# Patient Record
Sex: Male | Born: 1935 | Race: Black or African American | Hispanic: No | Marital: Married | Smoking: Never smoker
Health system: Southern US, Community
[De-identification: ages and names within clinical notes are randomized; demographics above are authoritative.]

## PROBLEM LIST (undated history)

## (undated) DIAGNOSIS — E785 Hyperlipidemia, unspecified: Secondary | ICD-10-CM

## (undated) DIAGNOSIS — I10 Essential (primary) hypertension: Secondary | ICD-10-CM

## (undated) DIAGNOSIS — I509 Heart failure, unspecified: Secondary | ICD-10-CM

## (undated) DIAGNOSIS — F0390 Unspecified dementia without behavioral disturbance: Secondary | ICD-10-CM

## (undated) HISTORY — PX: HERNIA REPAIR: SHX51

---

## 2018-01-30 ENCOUNTER — Encounter (HOSPITAL_COMMUNITY): Payer: Self-pay | Admitting: Emergency Medicine

## 2018-01-30 ENCOUNTER — Inpatient Hospital Stay (HOSPITAL_COMMUNITY)
Admission: EM | Admit: 2018-01-30 | Discharge: 2018-02-05 | DRG: 674 | Disposition: A | Discharge status: Home Health | Payer: Medicare Other | Attending: Internal Medicine | Admitting: Internal Medicine

## 2018-01-30 ENCOUNTER — Other Ambulatory Visit: Payer: Self-pay

## 2018-01-30 ENCOUNTER — Emergency Department (HOSPITAL_COMMUNITY): Payer: Medicare Other

## 2018-01-30 DIAGNOSIS — F0390 Unspecified dementia without behavioral disturbance: Secondary | ICD-10-CM | POA: Diagnosis present

## 2018-01-30 DIAGNOSIS — I509 Heart failure, unspecified: Secondary | ICD-10-CM

## 2018-01-30 DIAGNOSIS — N183 Chronic kidney disease, stage 3 (moderate): Secondary | ICD-10-CM | POA: Diagnosis present

## 2018-01-30 DIAGNOSIS — Z8673 Personal history of transient ischemic attack (TIA), and cerebral infarction without residual deficits: Secondary | ICD-10-CM

## 2018-01-30 DIAGNOSIS — I70203 Unspecified atherosclerosis of native arteries of extremities, bilateral legs: Secondary | ICD-10-CM | POA: Diagnosis present

## 2018-01-30 DIAGNOSIS — I5022 Chronic systolic (congestive) heart failure: Secondary | ICD-10-CM | POA: Diagnosis present

## 2018-01-30 DIAGNOSIS — I701 Atherosclerosis of renal artery: Principal | ICD-10-CM | POA: Diagnosis present

## 2018-01-30 DIAGNOSIS — R471 Dysarthria and anarthria: Secondary | ICD-10-CM

## 2018-01-30 DIAGNOSIS — I272 Pulmonary hypertension, unspecified: Secondary | ICD-10-CM | POA: Diagnosis present

## 2018-01-30 DIAGNOSIS — I10 Essential (primary) hypertension: Secondary | ICD-10-CM | POA: Diagnosis not present

## 2018-01-30 DIAGNOSIS — Z7901 Long term (current) use of anticoagulants: Secondary | ICD-10-CM | POA: Diagnosis not present

## 2018-01-30 DIAGNOSIS — Z23 Encounter for immunization: Secondary | ICD-10-CM

## 2018-01-30 DIAGNOSIS — H11002 Unspecified pterygium of left eye: Secondary | ICD-10-CM | POA: Diagnosis present

## 2018-01-30 DIAGNOSIS — R4781 Slurred speech: Secondary | ICD-10-CM | POA: Diagnosis present

## 2018-01-30 DIAGNOSIS — Z823 Family history of stroke: Secondary | ICD-10-CM | POA: Diagnosis not present

## 2018-01-30 DIAGNOSIS — R21 Rash and other nonspecific skin eruption: Secondary | ICD-10-CM | POA: Diagnosis present

## 2018-01-30 DIAGNOSIS — Z79890 Hormone replacement therapy (postmenopausal): Secondary | ICD-10-CM | POA: Diagnosis not present

## 2018-01-30 DIAGNOSIS — N289 Disorder of kidney and ureter, unspecified: Secondary | ICD-10-CM | POA: Diagnosis present

## 2018-01-30 DIAGNOSIS — I495 Sick sinus syndrome: Secondary | ICD-10-CM | POA: Diagnosis present

## 2018-01-30 DIAGNOSIS — I2510 Atherosclerotic heart disease of native coronary artery without angina pectoris: Secondary | ICD-10-CM | POA: Diagnosis present

## 2018-01-30 DIAGNOSIS — R001 Bradycardia, unspecified: Secondary | ICD-10-CM | POA: Diagnosis not present

## 2018-01-30 DIAGNOSIS — R29703 NIHSS score 3: Secondary | ICD-10-CM | POA: Diagnosis present

## 2018-01-30 DIAGNOSIS — I700 Atherosclerosis of aorta: Secondary | ICD-10-CM | POA: Diagnosis present

## 2018-01-30 DIAGNOSIS — I4891 Unspecified atrial fibrillation: Secondary | ICD-10-CM

## 2018-01-30 DIAGNOSIS — E785 Hyperlipidemia, unspecified: Secondary | ICD-10-CM | POA: Diagnosis present

## 2018-01-30 DIAGNOSIS — I160 Hypertensive urgency: Secondary | ICD-10-CM | POA: Diagnosis present

## 2018-01-30 DIAGNOSIS — I5020 Unspecified systolic (congestive) heart failure: Secondary | ICD-10-CM | POA: Diagnosis not present

## 2018-01-30 DIAGNOSIS — Z79899 Other long term (current) drug therapy: Secondary | ICD-10-CM

## 2018-01-30 DIAGNOSIS — E039 Hypothyroidism, unspecified: Secondary | ICD-10-CM | POA: Diagnosis present

## 2018-01-30 DIAGNOSIS — E875 Hyperkalemia: Secondary | ICD-10-CM | POA: Diagnosis not present

## 2018-01-30 DIAGNOSIS — T500X5A Adverse effect of mineralocorticoids and their antagonists, initial encounter: Secondary | ICD-10-CM | POA: Diagnosis present

## 2018-01-30 DIAGNOSIS — I4821 Permanent atrial fibrillation: Secondary | ICD-10-CM | POA: Diagnosis not present

## 2018-01-30 DIAGNOSIS — I5021 Acute systolic (congestive) heart failure: Secondary | ICD-10-CM | POA: Diagnosis not present

## 2018-01-30 DIAGNOSIS — X58XXXA Exposure to other specified factors, initial encounter: Secondary | ICD-10-CM | POA: Diagnosis not present

## 2018-01-30 HISTORY — DX: Heart failure, unspecified: I50.9

## 2018-01-30 HISTORY — DX: Hyperlipidemia, unspecified: E78.5

## 2018-01-30 HISTORY — DX: Essential (primary) hypertension: I10

## 2018-01-30 LAB — DIFFERENTIAL
Abs Immature Granulocytes: 0.01 10*3/uL (ref 0.00–0.07)
Basophils Absolute: 0 10*3/uL (ref 0.0–0.1)
Basophils Relative: 1 %
EOS PCT: 2 %
Eosinophils Absolute: 0.1 10*3/uL (ref 0.0–0.5)
Immature Granulocytes: 0 %
Lymphocytes Relative: 26 %
Lymphs Abs: 1.3 10*3/uL (ref 0.7–4.0)
Monocytes Absolute: 0.5 10*3/uL (ref 0.1–1.0)
Monocytes Relative: 11 %
Neutro Abs: 3 10*3/uL (ref 1.7–7.7)
Neutrophils Relative %: 60 %

## 2018-01-30 LAB — URINALYSIS, ROUTINE W REFLEX MICROSCOPIC
Bilirubin Urine: NEGATIVE
Glucose, UA: NEGATIVE mg/dL
Hgb urine dipstick: NEGATIVE
Ketones, ur: NEGATIVE mg/dL
Leukocytes, UA: NEGATIVE
Nitrite: NEGATIVE
PROTEIN: NEGATIVE mg/dL
Specific Gravity, Urine: 1.016 (ref 1.005–1.030)
pH: 6 (ref 5.0–8.0)

## 2018-01-30 LAB — I-STAT CHEM 8, ED
BUN: 25 mg/dL — ABNORMAL HIGH (ref 8–23)
Calcium, Ion: 1.11 mmol/L — ABNORMAL LOW (ref 1.15–1.40)
Chloride: 108 mmol/L (ref 98–111)
Creatinine, Ser: 1.2 mg/dL (ref 0.61–1.24)
Glucose, Bld: 84 mg/dL (ref 70–99)
HCT: 39 % (ref 39.0–52.0)
HEMOGLOBIN: 13.3 g/dL (ref 13.0–17.0)
Potassium: 4 mmol/L (ref 3.5–5.1)
Sodium: 140 mmol/L (ref 135–145)
TCO2: 23 mmol/L (ref 22–32)

## 2018-01-30 LAB — PROTIME-INR
INR: 1.34
Prothrombin Time: 16.5 seconds — ABNORMAL HIGH (ref 11.4–15.2)

## 2018-01-30 LAB — CBC
HCT: 40.4 % (ref 39.0–52.0)
Hemoglobin: 12.3 g/dL — ABNORMAL LOW (ref 13.0–17.0)
MCH: 29.6 pg (ref 26.0–34.0)
MCHC: 30.4 g/dL (ref 30.0–36.0)
MCV: 97.3 fL (ref 80.0–100.0)
Platelets: 121 10*3/uL — ABNORMAL LOW (ref 150–400)
RBC: 4.15 MIL/uL — ABNORMAL LOW (ref 4.22–5.81)
RDW: 13.9 % (ref 11.5–15.5)
WBC: 4.9 10*3/uL (ref 4.0–10.5)
nRBC: 0 % (ref 0.0–0.2)

## 2018-01-30 LAB — CBG MONITORING, ED
GLUCOSE-CAPILLARY: 77 mg/dL (ref 70–99)
Glucose-Capillary: 81 mg/dL (ref 70–99)

## 2018-01-30 LAB — COMPREHENSIVE METABOLIC PANEL
ALK PHOS: 46 U/L (ref 38–126)
ALT: 16 U/L (ref 0–44)
ANION GAP: 10 (ref 5–15)
AST: 23 U/L (ref 15–41)
Albumin: 3.7 g/dL (ref 3.5–5.0)
BUN: 24 mg/dL — ABNORMAL HIGH (ref 8–23)
CO2: 22 mmol/L (ref 22–32)
Calcium: 9.1 mg/dL (ref 8.9–10.3)
Chloride: 110 mmol/L (ref 98–111)
Creatinine, Ser: 1.29 mg/dL — ABNORMAL HIGH (ref 0.61–1.24)
GFR calc Af Amer: 58 mL/min — ABNORMAL LOW (ref >60)
GFR calc non Af Amer: 50 mL/min — ABNORMAL LOW (ref >60)
Glucose, Bld: 89 mg/dL (ref 70–99)
Potassium: 4.2 mmol/L (ref 3.5–5.1)
SODIUM: 142 mmol/L (ref 135–145)
Total Bilirubin: 0.8 mg/dL (ref 0.3–1.2)
Total Protein: 6.5 g/dL (ref 6.5–8.1)

## 2018-01-30 LAB — I-STAT TROPONIN, ED: Troponin i, poc: 0.01 ng/mL (ref 0.00–0.08)

## 2018-01-30 LAB — MRSA PCR SCREENING: MRSA by PCR: NEGATIVE

## 2018-01-30 LAB — DIGOXIN LEVEL

## 2018-01-30 LAB — APTT: aPTT: 37 seconds — ABNORMAL HIGH (ref 24–36)

## 2018-01-30 MED ORDER — APIXABAN 5 MG PO TABS
5.0000 mg | ORAL_TABLET | Freq: Two times a day (BID) | ORAL | Status: DC
  Administered 2018-01-30 – 2018-02-01 (×4): 5 mg via ORAL
  Filled 2018-01-30 (×4): qty 1

## 2018-01-30 MED ORDER — SPIRONOLACTONE 25 MG PO TABS
25.0000 mg | ORAL_TABLET | Freq: Every day | ORAL | Status: DC
  Administered 2018-01-31: 25 mg via ORAL
  Filled 2018-01-30: qty 1

## 2018-01-30 MED ORDER — INFLUENZA VAC SPLIT HIGH-DOSE 0.5 ML IM SUSY
0.5000 mL | PREFILLED_SYRINGE | INTRAMUSCULAR | Status: AC
  Administered 2018-02-01: 0.5 mL via INTRAMUSCULAR
  Filled 2018-01-30: qty 0.5

## 2018-01-30 MED ORDER — PNEUMOCOCCAL VAC POLYVALENT 25 MCG/0.5ML IJ INJ
0.5000 mL | INJECTION | INTRAMUSCULAR | Status: AC
  Administered 2018-02-01: 0.5 mL via INTRAMUSCULAR

## 2018-01-30 MED ORDER — ACETAMINOPHEN 650 MG RE SUPP: 650.0000 mg | Freq: Four times a day (QID) | RECTAL | Status: DC | PRN

## 2018-01-30 MED ORDER — FUROSEMIDE 40 MG PO TABS
40.0000 mg | ORAL_TABLET | Freq: Every day | ORAL | Status: DC
  Administered 2018-01-31 – 2018-02-05 (×6): 40 mg via ORAL
  Filled 2018-01-30 (×7): qty 1

## 2018-01-30 MED ORDER — HYDRALAZINE HCL 10 MG PO TABS
10.0000 mg | ORAL_TABLET | Freq: Four times a day (QID) | ORAL | Status: DC | PRN
  Administered 2018-01-30 – 2018-01-31 (×2): 10 mg via ORAL
  Filled 2018-01-30 (×2): qty 1

## 2018-01-30 MED ORDER — SIMVASTATIN 20 MG PO TABS
20.0000 mg | ORAL_TABLET | Freq: Every day | ORAL | Status: DC
  Administered 2018-01-30 – 2018-02-04 (×6): 20 mg via ORAL
  Filled 2018-01-30 (×6): qty 1

## 2018-01-30 MED ORDER — SODIUM CHLORIDE 0.9% FLUSH
3.0000 mL | Freq: Once | INTRAVENOUS | Status: AC
Start: 2018-01-30 — End: 2018-01-30
  Administered 2018-01-30: 3 mL via INTRAVENOUS

## 2018-01-30 MED ORDER — LISINOPRIL 20 MG PO TABS
40.0000 mg | ORAL_TABLET | Freq: Every day | ORAL | Status: DC
  Administered 2018-01-31 – 2018-02-05 (×5): 40 mg via ORAL
  Filled 2018-01-30 (×6): qty 2

## 2018-01-30 MED ORDER — SODIUM CHLORIDE 0.9% FLUSH
3.0000 mL | Freq: Two times a day (BID) | INTRAVENOUS | Status: DC
  Administered 2018-01-31 – 2018-02-05 (×8): 3 mL via INTRAVENOUS

## 2018-01-30 MED ORDER — ACETAMINOPHEN 325 MG PO TABS: 650.0000 mg | ORAL_TABLET | Freq: Four times a day (QID) | ORAL | Status: DC | PRN

## 2018-01-30 NOTE — ED Triage Notes (Signed)
Pt from home via ems; pt went down for a nap at 1230 this afternoon; woke at 1330 c/o lips feeling swollen and dry; per wife, pt has slurred speech; pt put on Clonidine yesterday (HTN management), took first does last night ; per pt's wife, pt's BP in the 200's at the [facility] yesterday; pt has no complaints; hx dementia

## 2018-01-30 NOTE — ED Notes (Signed)
Blood sent to main lab 

## 2018-01-30 NOTE — Plan of Care (Signed)
Pt has dementia but daughter is the caregiver. Daughter educated.

## 2018-01-30 NOTE — Consult Note (Signed)
Neurology Consultation  Reason for Consult: Code Stroke Referring Physician: Cathren LaineKevin Steinl, MD  CC: Slurred Speech  History is obtained from: EMS and wife via phone  HPI: Tyler GuardianMarvin Carr is a 85 y.o. male with hx CVA, Afib on eliquis, HTN, HLD, CHF bibems for slurred speech. [facility].  Clonidine was started which the pt took yesterday and this AM.  Code stroke was activated by EMS.  Pt was seen and examined. NIHSS 3.  Head Ct was negative for acute intracranial pathology.  Pt was not a candidate for IV tPA due eliquis and minor symptoms.   ED course: Pt was found to be bradycardic HR 30-40s and BP 180s systolic. Pt reported per-oral tingling but denied CP, SOB, fever, or chills.  LKW: 1230 tpa given?: no, eliquis and minor symptoms  ROS: Unable to obtain due to altered mental status.   PMH: see HPI  No family history on file.  Social History:   has no history on file for tobacco, alcohol, and drug.  Medications Eliquis Lisinopril Coreg Clonidine Lasix Spironolactone Zocor Melatonin  Exam: Current vital signs: BP (!) 149/71   Pulse (!) 41   Resp (!) 21   Ht 5\' 10"  (1.778 m)   Wt 73.9 kg   BMI 23.38 kg/m  Vital signs in last 24 hours: Pulse Rate:  [41] 41 (01/16 1600) Resp:  [21] 21 (01/16 1600) BP: (149)/(71) 149/71 (01/16 1600) Weight:  [73.9 kg] 73.9 kg (01/16 1500)  Physical Exam  Constitutional: Appears well-developed and well-nourished.  Psych: Affect appropriate to situation Eyes: No scleral injection HENT: No OP obstrucion Head: Normocephalic.  Cardiovascular: Normal rate and regular rhythm.  Respiratory: Effort normal, non-labored breathing GI: Soft.  No distension. There is no tenderness.  Skin: WDI  Neuro: Mental Status: Patient is awake, alert, oriented to person knows his age0 No signs of aphasia or neglect Cranial Nerves: II: Visual disturbances (baseline0. III,IV, VI: EOMI without ptosis or diploplia.  V:  Facial sensation is symmetric to temperature VII: Facial movement is symmetric.  VIII: hearing is intact to voice X: Uvula elevates symmetrically XI: Shoulder shrug is symmetric. XII: tongue is midline without atrophy or fasciculations.  Motor: Tone is normal. Bulk is normal. 5/5 strength was present in all four extremities. Sensory: Sensation is symmetric to light touch and temperature in the arms and legs. Deep Tendon Reflexes: 2+ and symmetric in the biceps and patellae.  Plantars: Toes are downgoing bilaterally.  Cerebellar: FNF and HKS are intact bilaterally  NIHSS 3   Labs  CBC    Component Value Date/Time   WBC 4.9 01/30/2018 1536   RBC 4.15 (L) 01/30/2018 1536   HGB 13.3 01/30/2018 1552   HCT 39.0 01/30/2018 1552   PLT 121 (L) 01/30/2018 1536   MCV 97.3 01/30/2018 1536   MCH 29.6 01/30/2018 1536   MCHC 30.4 01/30/2018 1536   RDW 13.9 01/30/2018 1536   LYMPHSABS 1.3 01/30/2018 1536   MONOABS 0.5 01/30/2018 1536   EOSABS 0.1 01/30/2018 1536   BASOSABS 0.0 01/30/2018 1536    CMP     Component Value Date/Time   NA 140 01/30/2018 1552   K 4.0 01/30/2018 1552   CL 108 01/30/2018 1552   GLUCOSE 84 01/30/2018 1552   BUN 25 (H) 01/30/2018 1552   CREATININE 1.20 01/30/2018 1552    Imaging I have reviewed the images obtained:  CT-scan of the brain: no acute intracranial pathology.   A/P: 85  yo M with hx Afib on eliquis, HTN, CHF, CVA, HLD p/w peri-oral tingling and slurred speech.  Code stroke was called. NIHSS 3. Head CT was negative for acute intracranial pathology.  Pt was not a candidate for IV tPA due to minor symptoms and eliquis use.  However, pt was noted to be bradycardic HR 30-40s.   Pt symptoms are unlikely due to acute stroke Pt was started on new medication clonidine 0.2 mg bid yesterday which may cause bradycardia. However, other cardiac etiologies such as MI should be investigated. Consider MRI brain with slurred speech persists  Pt is  critically ill due to bradycardia and hypertensive urgency with likelihood for clinical deterioration  Total critical care time spent 

## 2018-01-30 NOTE — H&P (Addendum)
History and Physical    Tyler Carr IPJ:[id number] DOB: [birth year] DOA: 01/30/2018  PCP: Kathryne Sharper, [place] Patient coming from: Home  Chief Complaint: slurred speech  HPI: Tyler Carr is a 85 y.o. male with medical history significant of apparently CAD, CHF, possibly Afib who presents for slurred speech.  [name] is a patient of the [place] and we have no records in our system.  His family is present with him (2 great nieces and a nephew) however, they do not know all of his history.  I was able to review his medications as they were brought in with him.  The history of presentation is that the [facility] and was started on clonidine for elevated blood pressure.  He took the clonidine the first time overnight and when he awoke this morning he had swelling of the lips and slurred speech.  He states that he felt like he could not adequately move his jaw.  He has dementia and some memory issues regarding his medical care.  His nieces filled in some blanks.  On examination in the ED, he was bradycardic into the 30s with BP which was preserved.  He is alert and responsive.  His niece states that his HR this morning was around 60.  He has z pads in place.  He denies any chest pain, confusion (more than baseline), weakness unilateral, headache, vision changes, dizziness, lightheadedness, recent fever chills or nausea, vomiting.  No changes in bowel or bladder habits.    ED Course: In the ED, he was assessed by Neurology and had a CT head which showed only chronic changes.  He was noted to have a low heart rate and Cardiology was consulted per the ED. EKG showed Afib with slow ventricular response.  Blood work showed a Cr of 1.29, baseline unknown  Review of Systems: As per HPI otherwise 10 point review of systems negative.   Past Medical History:  Diagnosis Date  . CHF (congestive heart failure) (HCC)   . Hyperlipidemia   . Hypertension     Past Surgical History:  Procedure  Laterality Date  . HERNIA REPAIR       reports that he has never smoked. He does not have any smokeless tobacco history on file. He reports previous alcohol use. He reports previous drug use.  Family History  Problem Relation Age of Onset  . Stroke Mother     Prior to Admission medications   Not on File  Medications brought in by family Apixaban 5mg  BID Furosemide 40mg  daily Spironolactone 25mg  daily Carvedilol 25mg  BID Lisinopril 40mg  daily Simvastatin 40mg  daily Amlodipine 2.5mg  daily Melatonin 3mg  at night  Physical Exam: Vitals:   01/30/18 1717 01/30/18 1730 01/30/18 1745 01/30/18 1815  BP: (!) 197/73 (!) 178/98  (!) 172/68  Pulse: 67 (!) 51 (!) 56 (!) 44  Resp: 20 16 19 13   SpO2: 97% 100% 100% 100%  Weight:      Height:        Constitutional: Elderly man, lying in bed, no distress Vitals:   01/30/18 1717 01/30/18 1730 01/30/18 1745 01/30/18 1815  BP: (!) 197/73 (!) 178/98  (!) 172/68  Pulse: 67 (!) 51 (!) 56 (!) 44  Resp: 20 16 19 13   SpO2: 97% 100% 100% 100%  Weight:      Height:       Eyes: PERRL, lids normal.  Pterygium on the left ENMT: Mucous membranes are dry. Posterior pharynx clear of any exudate or  lesions.  Poor dentition Neck: normal, supple Respiratory: CTAB, no wheezing noted Cardiovascular: Bradycardic, irregular, no murmur, extremities are cool, 2+ pitting edema to mid calves Abdomen: no tenderness, no masses palpated. Bowel sounds heard.  Musculoskeletal: no clubbing / cyanosis. Thin body habitus, decreased muscle tone Skin: He has chronic skin changes due to volume in the legs.  He has a mild erythematous non raised rash on upper chest.  Neurologic: CN 2-12 grossly intact. Sensation intact to light touch.  Strength is 5/5 in major muscle groups.  He is somewhat confused, this appears to be baseline.  Thought his nieces were his daughters.  Psychiatric: Normal judgment and insight. Normal mood.     Labs on Admission: I have personally  reviewed following labs and imaging studies  CBC: Recent Labs  Lab 01/30/18 1536 01/30/18 1552  WBC 4.9  --   NEUTROABS 3.0  --   HGB 12.3* 13.3  HCT 40.4 39.0  MCV 97.3  --   PLT 121*  --    Basic Metabolic Panel: Recent Labs  Lab 01/30/18 1536 01/30/18 1552  NA 142 140  K 4.2 4.0  CL 110 108  CO2 22  --   GLUCOSE 89 84  BUN 24* 25*  CREATININE 1.29* 1.20  CALCIUM 9.1  --    GFR: Estimated Creatinine Clearance: 46.5 mL/min (by C-G formula based on SCr of 1.2 mg/dL). Liver Function Tests: Recent Labs  Lab 01/30/18 1536  AST 23  ALT 16  ALKPHOS 46  BILITOT 0.8  PROT 6.5  ALBUMIN 3.7   No results for input(s): LIPASE, AMYLASE in the last 168 hours. No results for input(s): AMMONIA in the last 168 hours. Coagulation Profile: Recent Labs  Lab 01/30/18 1536  INR 1.34   Cardiac Enzymes: No results for input(s): CKTOTAL, CKMB, CKMBINDEX, TROPONINI in the last 168 hours. BNP (last 3 results) No results for input(s): PROBNP in the last 8760 hours. HbA1C: No results for input(s): HGBA1C in the last 72 hours. CBG: Recent Labs  Lab 01/30/18 1539 01/30/18 1840  GLUCAP 77 81   Lipid Profile: No results for input(s): CHOL, HDL, LDLCALC, TRIG, CHOLHDL, LDLDIRECT in the last 72 hours. Thyroid Function Tests: No results for input(s): TSH, T4TOTAL, FREET4, T3FREE, THYROIDAB in the last 72 hours. Anemia Panel: No results for input(s): VITAMINB12, FOLATE, FERRITIN, TIBC, IRON, RETICCTPCT in the last 72 hours. Urine analysis:    Component Value Date/Time   COLORURINE YELLOW 01/30/2018 1746   APPEARANCEUR CLEAR 01/30/2018 1746   LABSPEC 1.016 01/30/2018 1746   PHURINE 6.0 01/30/2018 1746   GLUCOSEU NEGATIVE 01/30/2018 1746   HGBUR NEGATIVE 01/30/2018 1746   BILIRUBINUR NEGATIVE 01/30/2018 1746   KETONESUR NEGATIVE 01/30/2018 1746   PROTEINUR NEGATIVE 01/30/2018 1746   NITRITE NEGATIVE 01/30/2018 1746   LEUKOCYTESUR NEGATIVE 01/30/2018 1746    Radiological  Exams on Admission: Ct Head Code Stroke Wo Contrast  Result Date: 01/30/2018 CLINICAL DATA:  Code stroke.  Slurred speech. EXAM: CT HEAD WITHOUT CONTRAST TECHNIQUE: Contiguous axial images were obtained from the base of the skull through the vertex without intravenous contrast. COMPARISON:  None. FINDINGS: Brain: There is no evidence of acute infarct, intracranial hemorrhage, mass, midline shift, or extra-axial fluid collection. Generalized cerebral atrophy is relatively mild for age. There is a small chronic left cerebellar infarct. Patchy hypodensities in the cerebral white matter bilaterally are nonspecific but compatible with moderate chronic small vessel ischemic disease. Vascular: Calcified atherosclerosis at the skull base. No hyperdense vessel. Skull: No  fracture or focal osseous lesion. Sinuses/Orbits: Small mucous retention cysts in the maxillary sinuses. Clear mastoid air cells. Unremarkable orbits. 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 ASPECTS [facility] Stroke Program Early CT Score) - Ganglionic level infarction (caudate, lentiform nuclei, internal capsule, insula, M1-M3 cortex): 7 - Supraganglionic infarction (M4-M6 cortex): 3 Total score (0-10 with 10 being normal): 10 IMPRESSION: 1. No evidence of acute intracranial abnormality. 2. ASPECTS is 10. 3. Moderate chronic small vessel ischemic disease. 4. Chronic left cerebellar infarct. These results were called by telephone at the time of interpretation on 01/30/2018 at 3:52 pm to Dr. Scherrie November, who verbally acknowledged these results. Electronically Signed   By: Sebastian Ache M.D.   On: 01/30/2018 15:53    EKG: Independently reviewed. Bradycardia, Afib  Assessment/Plan Bradycardia, slurred speech - Slurred speech improved, no stroke by CT head - Possibly medication effect with the clonidine vs. SSS, Cardiology consulted by the ED.  If not seen, will need to be called in the AM - Pads in place - Telemetry - Allow wash out of clonidine, monitor  closely - Avoid nodal blocking medications - Trend Troponin - EKG in the AM    CHF (congestive heart failure)  - Unclear if systolic or diastolic, no TTE in our system, but he is on medications which are consistent with this diagnosis - Avoid nodal blockade (hold coreg in acute bradycardia) - Continue diuretics for now, monitor blood pressure - Hold amlodipine (< 1% post marketing bradycardia) - PRN Hydralazine for SBP > 180    Hypertension - See above - Restart home lisinopril - Hydralazine PRN    Hyperlipidemia - Continue home simvastatin  Elevated Creatinine - Unclear if at baseline, trend.   Dementia - Hold melatonin for tonight while monitoring bradycardia, this is also a relatively new medication for him.   DVT prophylaxis: Apixaban Code Status: Full Family Communication: Nieces and nephew at bedside  Disposition Plan: Admit to monitor bradycardia  Consults called: Neurology, Cardiology both by EDP Admission status: SDU, IP   Debe Coder MD [facility]   If 7PM-7AM, please contact night-coverage [email or web address]   01/30/2018, 6:50 PM

## 2018-01-30 NOTE — Code Documentation (Signed)
85 yo male coming from home where he lives with his wife. Pt was seen at the [facility] yesterday where he was noted to be hypertensive. MD changed BP medication and started him on Clonidine. Pt took first dose last night and woke up this morning at his baseline. At 1230, pt laid down to take a nap. At 1330, he woke up and complained of subjective swelling and tingling in his lips to his wife. Wife noted some slurred speech and called EMS. EMS activated a Code Stroke. Stroke Team met patient upon arrival to the ED. Initial NIHSS 3 due to baseline inability to answer month and some aphasia consistent with dementia. Slight dysarthria noted. Not baseline per family. CT Head negative. Pt had hx of taking Eliquis. Not a tPA candidate due to last dose being this morning. Pt does not have signs of LVO. Pt taken back to the room and placed on the cardiac monitor. Pt's HR noted to be in the 30s and 40s. EDP made aware. Code Stroke cancelled. Handoff given to [facility], Therapist, sports.

## 2018-01-30 NOTE — ED Notes (Signed)
Dr. Steinl aware of pt's HTN 

## 2018-01-30 NOTE — ED Provider Notes (Signed)
[facility] EMERGENCY DEPARTMENT Provider Note   CSN: [id number] Arrival date & time: 01/30/18  1535     History   Chief Complaint No chief complaint on file.   HPI Tyler Carr is a 85 y.o. male.  Patient presents via EMS as code stroke. Per report, went to his doctor yesterday, and bp was high then, so clonidine was added to meds. This afternoon, awoke from nap and was reported not acting like self, ?slurred speech then. On arrival to ED is awake and alert, speech clear. Pt denies headache. No numbness/weakness. Pt denies pain. No fever or chills. Pt with hx dementia - level 5 caveat.   The history is provided by the patient and the EMS personnel. The history is limited by the condition of the patient.   PMH: afib, htn, dementia   Meds: Carvedilol, amlodipine, clonidine, 'anticoagulant'     Home Medications    Prior to Admission medications   Not on File    Family History No family history on file.  Social History Social History   Tobacco Use  . Smoking status: Not on file  Substance Use Topics  . Alcohol use: Not on file  . Drug use: Not on file     Allergies   Patient has no allergy information on record.   Review of Systems Review of Systems  Constitutional: Negative for fever.  HENT: Negative for sore throat.   Eyes: Negative for visual disturbance.  Respiratory: Negative for shortness of breath.   Cardiovascular: Negative for chest pain.  Gastrointestinal: Negative for abdominal pain and vomiting.  Genitourinary: Negative for dysuria and flank pain.  Musculoskeletal: Negative for back pain and neck pain.  Skin: Negative for rash.  Neurological: Negative for headaches.  Hematological: Does not bruise/bleed easily.  Psychiatric/Behavioral: The patient is not nervous/anxious.      Physical Exam Updated Vital Signs BP (!) 149/71   Pulse (!) 41   Resp (!) 21   Ht 1.778 m (5\' 10" )   Wt 73.9 kg   BMI 23.38 kg/m    Physical Exam Vitals signs and nursing note reviewed.  Constitutional:      Appearance: Normal appearance. He is well-developed.  HENT:     Head: Atraumatic.     Nose: Nose normal.     Mouth/Throat:     Mouth: Mucous membranes are moist.     Pharynx: Oropharynx is clear.  Eyes:     General: No scleral icterus.    Conjunctiva/sclera: Conjunctivae normal.     Pupils: Pupils are equal, round, and reactive to light.  Neck:     Musculoskeletal: Normal range of motion and neck supple. No neck rigidity.     Trachea: No tracheal deviation.  Cardiovascular:     Rate and Rhythm: Regular rhythm. Bradycardia present.     Pulses: Normal pulses.     Heart sounds: Normal heart sounds. No murmur. No friction rub. No gallop.   Pulmonary:     Effort: Pulmonary effort is normal. No accessory muscle usage or respiratory distress.     Breath sounds: Normal breath sounds.  Abdominal:     General: Bowel sounds are normal. There is no distension.     Palpations: Abdomen is soft.     Tenderness: There is no abdominal tenderness. There is no guarding.  Genitourinary:    Comments: No cva tenderness. Musculoskeletal:        General: No swelling.  Skin:    General: Skin is warm and  dry.     Findings: No rash.  Neurological:     Mental Status: He is alert.     Comments: Alert, speech clear/fluent. Motor intact bil, stre 5/5. sens grossly intact. No pronator drift.   Psychiatric:        Mood and Affect: Mood normal.      ED Treatments / Results  Labs (all labs ordered are listed, but only abnormal results are displayed) Results for orders placed or performed during the hospital encounter of 01/30/18  Protime-INR  Result Value Ref Range   Prothrombin Time 16.5 (H) 11.4 - 15.2 seconds   INR 1.34   APTT  Result Value Ref Range   aPTT 37 (H) 24 - 36 seconds  CBC  Result Value Ref Range   WBC 4.9 4.0 - 10.5 K/uL   RBC 4.15 (L) 4.22 - 5.81 MIL/uL   Hemoglobin 12.3 (L) 13.0 - 17.0 g/dL    HCT 40.9 81.1 - 91.4 %   MCV 97.3 80.0 - 100.0 fL   MCH 29.6 26.0 - 34.0 pg   MCHC 30.4 30.0 - 36.0 g/dL   RDW 78.2 95.6 - 21.3 %   Platelets 121 (L) 150 - 400 K/uL   nRBC 0.0 0.0 - 0.2 %  Differential  Result Value Ref Range   Neutrophils Relative % 60 %   Neutro Abs 3.0 1.7 - 7.7 K/uL   Lymphocytes Relative 26 %   Lymphs Abs 1.3 0.7 - 4.0 K/uL   Monocytes Relative 11 %   Monocytes Absolute 0.5 0.1 - 1.0 K/uL   Eosinophils Relative 2 %   Eosinophils Absolute 0.1 0.0 - 0.5 K/uL   Basophils Relative 1 %   Basophils Absolute 0.0 0.0 - 0.1 K/uL   Immature Granulocytes 0 %   Abs Immature Granulocytes 0.01 0.00 - 0.07 K/uL  Comprehensive metabolic panel  Result Value Ref Range   Sodium 142 135 - 145 mmol/L   Potassium 4.2 3.5 - 5.1 mmol/L   Chloride 110 98 - 111 mmol/L   CO2 22 22 - 32 mmol/L   Glucose, Bld 89 70 - 99 mg/dL   BUN 24 (H) 8 - 23 mg/dL   Creatinine, Ser 0.86 (H) 0.61 - 1.24 mg/dL   Calcium 9.1 8.9 - 57.8 mg/dL   Total Protein 6.5 6.5 - 8.1 g/dL   Albumin 3.7 3.5 - 5.0 g/dL   AST 23 15 - 41 U/L   ALT 16 0 - 44 U/L   Alkaline Phosphatase 46 38 - 126 U/L   Total Bilirubin 0.8 0.3 - 1.2 mg/dL   GFR calc non Af Amer 50 (L) >60 mL/min   GFR calc Af Amer 58 (L) >60 mL/min   Anion gap 10 5 - 15  I-stat troponin, ED  Result Value Ref Range   Troponin i, poc 0.01 0.00 - 0.08 ng/mL   Comment 3          CBG monitoring, ED  Result Value Ref Range   Glucose-Capillary 77 70 - 99 mg/dL  I-Stat Chem 8, ED  Result Value Ref Range   Sodium 140 135 - 145 mmol/L   Potassium 4.0 3.5 - 5.1 mmol/L   Chloride 108 98 - 111 mmol/L   BUN 25 (H) 8 - 23 mg/dL   Creatinine, Ser 4.69 0.61 - 1.24 mg/dL   Glucose, Bld 84 70 - 99 mg/dL   Calcium, Ion 6.29 (L) 1.15 - 1.40 mmol/L   TCO2 23 22 - 32 mmol/L  Hemoglobin 13.3 13.0 - 17.0 g/dL   HCT 21.339.0 08.639.0 - 57.852.0 %   Ct Head Code Stroke Wo Contrast  Result Date: 01/30/2018 CLINICAL DATA:  Code stroke.  Slurred speech. EXAM: CT HEAD  WITHOUT CONTRAST TECHNIQUE: Contiguous axial images were obtained from the base of the skull through the vertex without intravenous contrast. COMPARISON:  None. FINDINGS: Brain: There is no evidence of acute infarct, intracranial hemorrhage, mass, midline shift, or extra-axial fluid collection. Generalized cerebral atrophy is relatively mild for age. There is a small chronic left cerebellar infarct. Patchy hypodensities in the cerebral white matter bilaterally are nonspecific but compatible with moderate chronic small vessel ischemic disease. Vascular: Calcified atherosclerosis at the skull base. No hyperdense vessel. Skull: No fracture or focal osseous lesion. Sinuses/Orbits: Small mucous retention cysts in the maxillary sinuses. Clear mastoid air cells. Unremarkable orbits. 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 ASPECTS [facility]([place] Stroke Program Early CT Score) - Ganglionic level infarction (caudate, lentiform nuclei, internal capsule, insula, M1-M3 cortex): 7 - Supraganglionic infarction (M4-M6 cortex): 3 Total score (0-10 with 10 being normal): 10 IMPRESSION: 1. No evidence of acute intracranial abnormality. 2. ASPECTS is 10. 3. Moderate chronic small vessel ischemic disease. 4. Chronic left cerebellar infarct. These results were called by telephone at the time of interpretation on 01/30/2018 at 3:52 pm to Dr. Scherrie Novembersias, who verbally acknowledged these results. Electronically Signed   By: Sebastian AcheAllen  Grady M.D.   On: 01/30/2018 15:53    EKG EKG Interpretation  Date/Time:  Thursday January 30 2018 16:00:00 EST Ventricular Rate:  49 PR Interval:    QRS Duration: 99 QT Interval:  517 QTC Calculation: 448 R Axis:   71 Text Interpretation:  Atrial fibrillation with slow ventricular response Baseline wander Confirmed by Cathren LaineSteinl, Auri Jahnke ([id number]) on 01/30/2018 4:07:46 PM   Radiology Ct Head Code Stroke Wo Contrast  Result Date: 01/30/2018 CLINICAL DATA:  Code stroke.  Slurred speech. EXAM: CT HEAD WITHOUT CONTRAST  TECHNIQUE: Contiguous axial images were obtained from the base of the skull through the vertex without intravenous contrast. COMPARISON:  None. FINDINGS: Brain: There is no evidence of acute infarct, intracranial hemorrhage, mass, midline shift, or extra-axial fluid collection. Generalized cerebral atrophy is relatively mild for age. There is a small chronic left cerebellar infarct. Patchy hypodensities in the cerebral white matter bilaterally are nonspecific but compatible with moderate chronic small vessel ischemic disease. Vascular: Calcified atherosclerosis at the skull base. No hyperdense vessel. Skull: No fracture or focal osseous lesion. Sinuses/Orbits: Small mucous retention cysts in the maxillary sinuses. Clear mastoid air cells. Unremarkable orbits. 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 ASPECTS [facility]([place] Stroke Program Early CT Score) - Ganglionic level infarction (caudate, lentiform nuclei, internal capsule, insula, M1-M3 cortex): 7 - Supraganglionic infarction (M4-M6 cortex): 3 Total score (0-10 with 10 being normal): 10 IMPRESSION: 1. No evidence of acute intracranial abnormality. 2. ASPECTS is 10. 3. Moderate chronic small vessel ischemic disease. 4. Chronic left cerebellar infarct. These results were called by telephone at the time of interpretation on 01/30/2018 at 3:52 pm to Dr. Scherrie Novembersias, who verbally acknowledged these results. Electronically Signed   By: Sebastian AcheAllen  Grady M.D.   On: 01/30/2018 15:53    Procedures Procedures (including critical care time)  Medications Ordered in ED Medications  sodium chloride flush (NS) 0.9 % injection 3 mL (3 mLs Intravenous Given 01/30/18 1615)     Initial Impression / Assessment and Plan / ED Course  I have reviewed the triage vital signs and the nursing notes.  Pertinent labs & imaging results that were available during my care of the patient were reviewed by me and considered in my medical decision making (see chart for details).  Iv ns. Continuous  pulse ox and monitor. Ecg. Labs sent. Stat ct.   Pt was code stroke prior to arrival - neurology consulted and evaluated in ED.   Reviewed nursing notes and prior charts for additional history.   Labs reviewed - chem normal.  Ct reviewed - neg for acute stroke.  Patient markedly bradycardic, heart rate as low as 30, but generally 40's, slow a fib. Pt meds in bag reviewed - is on carvedilol, no dig noted.   External pacer pads applied.  Cardiology consulted re symptomatic bradycardia. Discussed with Rosann Auerbach - they will consult on patient.   Unassigned medicine consulted for admission.    CRITICAL CARE  RE: severe, symptomatic bradycardia, external pacing pads, a fib with slow ventricular response, weakness Performed by: Suzi Roots Total critical care time: 35 minutes Critical care time was exclusive of separately billable procedures and treating other patients. Critical care was necessary to treat or prevent imminent or life-threatening deterioration. Critical care was time spent personally by me on the following activities: development of treatment plan with patient and/or surrogate as well as nursing, discussions with consultants, evaluation of patient's response to treatment, examination of patient, obtaining history from patient or surrogate, ordering and performing treatments and interventions, ordering and review of laboratory studies, ordering and review of radiographic studies, pulse oximetry and re-evaluation of patient's condition.  Final Clinical Impressions(s) / ED Diagnoses   Final diagnoses:  None    ED Discharge Orders    None       Cathren Laine, MD 01/30/18 1655

## 2018-01-31 ENCOUNTER — Inpatient Hospital Stay (HOSPITAL_COMMUNITY): Payer: Medicare Other

## 2018-01-31 ENCOUNTER — Encounter (HOSPITAL_COMMUNITY): Payer: Self-pay | Admitting: General Practice

## 2018-01-31 DIAGNOSIS — R001 Bradycardia, unspecified: Secondary | ICD-10-CM

## 2018-01-31 DIAGNOSIS — I361 Nonrheumatic tricuspid (valve) insufficiency: Secondary | ICD-10-CM

## 2018-01-31 DIAGNOSIS — I351 Nonrheumatic aortic (valve) insufficiency: Secondary | ICD-10-CM

## 2018-01-31 DIAGNOSIS — I4821 Permanent atrial fibrillation: Secondary | ICD-10-CM

## 2018-01-31 DIAGNOSIS — I10 Essential (primary) hypertension: Secondary | ICD-10-CM

## 2018-01-31 DIAGNOSIS — I340 Nonrheumatic mitral (valve) insufficiency: Secondary | ICD-10-CM

## 2018-01-31 DIAGNOSIS — I5020 Unspecified systolic (congestive) heart failure: Secondary | ICD-10-CM

## 2018-01-31 LAB — BASIC METABOLIC PANEL
Anion gap: 9 (ref 5–15)
BUN: 22 mg/dL (ref 8–23)
CHLORIDE: 107 mmol/L (ref 98–111)
CO2: 24 mmol/L (ref 22–32)
CREATININE: 1.31 mg/dL — AB (ref 0.61–1.24)
Calcium: 9.2 mg/dL (ref 8.9–10.3)
GFR calc Af Amer: 57 mL/min — ABNORMAL LOW (ref >60)
GFR calc non Af Amer: 49 mL/min — ABNORMAL LOW (ref >60)
Glucose, Bld: 109 mg/dL — ABNORMAL HIGH (ref 70–99)
Potassium: 5.7 mmol/L — ABNORMAL HIGH (ref 3.5–5.1)
Sodium: 140 mmol/L (ref 135–145)

## 2018-01-31 LAB — ECHOCARDIOGRAM COMPLETE
Height: 70 in
WEIGHTICAEL: 2464 [oz_av]

## 2018-01-31 LAB — GLUCOSE, CAPILLARY: Glucose-Capillary: 66 mg/dL — ABNORMAL LOW (ref 70–99)

## 2018-01-31 LAB — T4, FREE: Free T4: 0.93 ng/dL (ref 0.82–1.77)

## 2018-01-31 LAB — TSH: TSH: 7.371 u[IU]/mL — ABNORMAL HIGH (ref 0.350–4.500)

## 2018-01-31 MED ORDER — HYDRALAZINE HCL 10 MG PO TABS: 10.0000 mg | ORAL_TABLET | Freq: Four times a day (QID) | ORAL | Status: DC | PRN

## 2018-01-31 MED ORDER — SODIUM ZIRCONIUM CYCLOSILICATE 10 G PO PACK
10.0000 g | PACK | Freq: Once | ORAL | Status: AC
  Administered 2018-01-31: 10 g via ORAL
  Filled 2018-01-31 (×2): qty 1

## 2018-01-31 MED ORDER — AMLODIPINE BESYLATE 5 MG PO TABS
5.0000 mg | ORAL_TABLET | Freq: Every day | ORAL | Status: DC
  Administered 2018-01-31 – 2018-02-05 (×5): 5 mg via ORAL
  Filled 2018-01-31 (×6): qty 1

## 2018-01-31 MED ORDER — HYDRALAZINE HCL 50 MG PO TABS
25.0000 mg | ORAL_TABLET | Freq: Three times a day (TID) | ORAL | Status: DC
  Administered 2018-01-31: 25 mg via ORAL
  Filled 2018-01-31: qty 1

## 2018-01-31 MED ORDER — HYDRALAZINE HCL 50 MG PO TABS
50.0000 mg | ORAL_TABLET | Freq: Three times a day (TID) | ORAL | Status: DC
  Administered 2018-01-31 – 2018-02-05 (×16): 50 mg via ORAL
  Filled 2018-01-31 (×16): qty 1

## 2018-01-31 NOTE — Progress Notes (Signed)
Called into room by patient's wife stating that the patient was spitting up blood.  Went into the room and patient had been spitting up a small amount of blood tinged sputum.  Vital signs are stable.  Notified MD. 01/31/2018 11:59 AM Jaclyn Shaggy Everhart

## 2018-01-31 NOTE — Consult Note (Addendum)
Cardiology Consultation:   Patient ID: Bonifacio Pruden MRN: [id number]; DOB: Oct 24, 1930  Admit date: 01/30/2018 Date of Consult: 01/31/2018  Primary Care Provider: Patient, No Pcp Per Primary Cardiologist: New to [facility] (goes to [facility] clinic) Primary Electrophysiologist:  None    Patient Profile:   Yuta Cipollone is a 85 y.o. male with a hx of atrial fibrillation, on anticoagulation therapy with apixaban, reported history of congestive heart failure, hypertension, hyperlipidemia and dementia, followed at the [facility] clinic, who is being seen today for the evaluation of bradycardia at the request of Dr. Isidoro Donning, Internal Medicine.  Per History from Pt's wife, he has conduction disease. He wore a heart monitor ~3 years ago at the [place] and was diagnosed with atrial fibrillation and noted to have a "pause". A pacemaker was recommended but pt declined. Per wife, he has been on Coreg at home and was having no issues until recently starting clonidine. There is no history of syncope/ near syncope with coreg.   History of Present Illness:   Mr. Gaza is new to [facility].  Per medical records, he is followed at the [facility].  Per H&P, he has a history of atrial fibrillation and was on Eliquis as an outpatient as well as reported history of congestive heart failure, hypertension, hyperlipidemia as well as dementia.  Per family report, the patient was seen by his PCP at the [place] the day prior to this admission and he was started on clonidine for elevated blood pressure.  Per H&P, his blood pressure at the [place] clinic was in the 200s systolic.  Patient apparently took the first dose of clonidine the night of January 15.  When he woke up the following morning (which was yesterday), he had reported swelling of his lips and slurred speech prompting his family to bring him into the ED.    In the ED, he was found to be bradycardic with heart rate in the 30s.  EKG showed atrial fibrillation.  Neurology  assessed the patient and head CT was unremarkable.  Neurology did not feel his symptoms were due to acute stroke.  It should also be noted that his blood pressure was also elevated at time of admission in the 200s systolic.  Clonidine was held.  Patient was placed on hydralazine and continued on his home doses of Lasix, lisinopril and Aldactone (has since been discontinued due to hyperkalemia0.  He had an echocardiogram earlier today which showed normal left ventricular ejection fraction at 50%, moderate LVH and diffuse hypokinesis.  The study was not technically sufficient to allow evaluation of LV diastolic dysfunction due to atrial fibrillation.  Mild AI was also noted.  The asending aorta was dilated measuring at 44 mm.  Mild mitral regurgitation also noted as well as severe biatrial enlargement and moderate pulmonary hypertension.  Renal artery Dopplers have also been ordered to rule out renal artery stenosis given his severely elevated blood pressure, despite being on several antihypertensive agents.  Tele reviewed. Longest pause was 3.10 sec. Pt has been asymptomatic, denying dizziness, syncope/ near syncope, CP and dyspnea. Rates have improved after discontinuation of clonidine. Resting HR in the 90s. When I arrived to pt's room, he was out of bed and ambulating around room w/o any symptoms. Wife and daughter at the bedside.   Past Medical History:  Diagnosis Date  . CHF (congestive heart failure) (HCC)   . Hyperlipidemia   . Hypertension     Past Surgical History:  Procedure Laterality Date  .  HERNIA REPAIR       Home Medications:  Prior to Admission medications   Medication Sig Start Date End Date Taking? Authorizing Provider  amLODipine (NORVASC) 2.5 MG tablet Take 2.5 mg by mouth daily.   Yes [provider]  apixaban (ELIQUIS) 5 MG TABS tablet Take 5 mg by mouth 2 (two) times daily.   Yes [provider]  carvedilol (COREG) 25 MG tablet Take 12.5 mg by mouth 2  (two) times daily.   Yes [provider]  cloNIDine (CATAPRES) 0.2 MG tablet Take 0.2 mg by mouth 2 (two) times daily.   Yes [provider]  furosemide (LASIX) 40 MG tablet Take 40 mg by mouth daily.   Yes [provider]  lisinopril (PRINIVIL,ZESTRIL) 40 MG tablet Take 40 mg by mouth daily.   Yes [provider]  Melatonin 3 MG TABS Take 3-6 mg by mouth at bedtime as needed (sleep and agitation).   Yes [provider]  simvastatin (ZOCOR) 40 MG tablet Take 20 mg by mouth at bedtime.   Yes [provider]  spironolactone (ALDACTONE) 25 MG tablet Take 25 mg by mouth daily.   Yes [provider]    Inpatient Medications: Scheduled Meds: . apixaban  5 mg Oral BID  . furosemide  40 mg Oral Daily  . hydrALAZINE  50 mg Oral Q8H  . Influenza vac split quadrivalent PF  0.5 mL Intramuscular Tomorrow-1000  . lisinopril  40 mg Oral Daily  . pneumococcal 23 valent vaccine  0.5 mL Intramuscular Tomorrow-1000  . simvastatin  20 mg Oral QHS  . sodium chloride flush  3 mL Intravenous Q12H  . sodium zirconium cyclosilicate  10 g Oral Once   Continuous Infusions:  PRN Meds: acetaminophen **OR** acetaminophen, hydrALAZINE  Allergies:   Not on File  Social History:   Social History   Socioeconomic History  . Marital status: Married    Spouse name: Not on file  . Number of children: Not on file  . Years of education: Not on file  . Highest education level: Not on file  Occupational History  . Not on file  Social Needs  . Financial resource strain: Patient refused  . Food insecurity:    Worry: Patient refused    Inability: Patient refused  . Transportation needs:    Medical: Patient refused    Non-medical: Patient refused  Tobacco Use  . Smoking status: Never Smoker  Substance and Sexual Activity  . Alcohol use: Not Currently  . Drug use: Not Currently  . Sexual activity: Not Currently  Lifestyle  . Physical activity:     Days per week: Patient refused    Minutes per session: Patient refused  . Stress: Patient refused  Relationships  . Social connections:    Talks on phone: Patient refused    Gets together: Patient refused    Attends religious service: Patient refused    Active member of club or organization: Patient refused    Attends meetings of clubs or organizations: Patient refused    Relationship status: Patient refused  . Intimate partner violence:    Fear of current or ex partner: Patient refused    Emotionally abused: Patient refused    Physically abused: Patient refused    Forced sexual activity: Patient refused  Other Topics Concern  . Not on file  Social History Narrative  . Not on file    Family History:    Family History  Problem Relation Age of Onset  .  Stroke Mother      ROS:  Please see the history of present illness.   All other ROS reviewed and negative.     Physical Exam/Data:   Vitals:   01/31/18 0502 01/31/18 0600 01/31/18 0812 01/31/18 0956  BP: (!) 198/88 (!) 200/106 (!) 160/88 (!) 191/76  Pulse:   74   Resp: 18 16 19    Temp: 97.8 F (36.6 C)  97.7 F (36.5 C)   TempSrc: Oral  Oral   SpO2:   98%   Weight:      Height:        Intake/Output Summary (Last 24 hours) at 01/31/2018 1331 Last data filed at 01/31/2018 0900 Gross per 24 hour  Intake 0 ml  Output 0 ml  Net 0 ml   Last 3 Weights 01/31/2018 01/30/2018  Weight (lbs) 154 lb 162 lb 14.7 oz  Weight (kg) 69.854 kg 73.9 kg     Body mass index is 22.1 kg/m.  General:  Elderly thin AAM, in no acute distress.  HEENT: normal Lymph: no adenopathy Neck: no JVD Endocrine:  No thryomegaly Vascular: No carotid bruits; FA pulses 2+ bilaterally without bruits  Cardiac:  Irregular rhythm, regular rate Lungs:  clear to auscultation bilaterally, no wheezing, rhonchi or rales  Abd: soft, nontender, no hepatomegaly  Ext: no edema Musculoskeletal:  No deformities, BUE and BLE strength normal and equal Skin:  warm and dry  Neuro:  CNs 2-12 intact, no focal abnormalities noted Psych:  Normal affect   EKG:  The EKG was personally reviewed and demonstrates:  Atrial fibrillation w/ SVR Telemetry:  Telemetry was personally reviewed and demonstrates: afib, 90s currently, bradycardia noted earlier with occasional pauses, the longest 3.10 sec.   Relevant CV Studies: 2D Echo 01/31/18 Study Conclusions  - Left ventricle: The cavity size was normal. Wall thickness was   increased in a pattern of moderate LVH. The estimated ejection   fraction was 50%. Diffuse hypokinesis. The study was not   technically sufficient to allow evaluation of LV diastolic   dysfunction due to atrial fibrillation. - Aortic valve: There was no stenosis. There was mild   regurgitation. - Aorta: Dilated ascending aorta. Ascending aortic diameter: 44 mm   (S). - Mitral valve: There was mild regurgitation. - Left atrium: The atrium was severely dilated. - Right ventricle: The cavity size was normal. Systolic function   was mildly reduced. - Right atrium: The atrium was severely dilated. - Tricuspid valve: Peak RV-RA gradient (S): 46 mm Hg. - Pulmonary arteries: PA peak pressure: 61 mm Hg (S). - Systemic veins: IVC measured 2.4 cm with < 50% respirophasic   variation, suggesting RA pressure 15 mmHg. - Pericardium, extracardiac: A trivial pericardial effusion was   identified.  Impressions:  - The patient was in atrial fibrillation. Normal LV size with   moderate LV hypertrophy. EF 50%, diffuse mild hypokinesis. Normal   RV size with mildly decreased systolic function. Moderate   pulmonary hypertension. Severe biatrial enlargement. Mild MR, TR,   and AI.  Laboratory Data:  Chemistry Recent Labs  Lab 01/30/18 1536 01/30/18 1552 01/31/18 0643  NA 142 140 140  K 4.2 4.0 5.7*  CL 110 108 107  CO2 22  --  24  GLUCOSE 89 84 109*  BUN 24* 25* 22  CREATININE 1.29* 1.20 1.31*  CALCIUM 9.1  --  9.2  GFRNONAA 50*   --  49*  GFRAA 58*  --  57*  ANIONGAP 10  --  9    Recent Labs  Lab 01/30/18 1536  PROT 6.5  ALBUMIN 3.7  AST 23  ALT 16  ALKPHOS 46  BILITOT 0.8   Hematology Recent Labs  Lab 01/30/18 1536 01/30/18 1552  WBC 4.9  --   RBC 4.15*  --   HGB 12.3* 13.3  HCT 40.4 39.0  MCV 97.3  --   MCH 29.6  --   MCHC 30.4  --   RDW 13.9  --   PLT 121*  --    Cardiac EnzymesNo results for input(s): TROPONINI in the last 168 hours.  Recent Labs  Lab 01/30/18 1550  TROPIPOC 0.01    BNPNo results for input(s): BNP, PROBNP in the last 168 hours.  DDimer No results for input(s): DDIMER in the last 168 hours.  Radiology/Studies:  Ct Head Code Stroke Wo Contrast  Result Date: 01/30/2018 CLINICAL DATA:  Code stroke.  Slurred speech. EXAM: CT HEAD WITHOUT CONTRAST TECHNIQUE: Contiguous axial images were obtained from the base of the skull through the vertex without intravenous contrast. COMPARISON:  None. FINDINGS: Brain: There is no evidence of acute infarct, intracranial hemorrhage, mass, midline shift, or extra-axial fluid collection. Generalized cerebral atrophy is relatively mild for age. There is a small chronic left cerebellar infarct. Patchy hypodensities in the cerebral white matter bilaterally are nonspecific but compatible with moderate chronic small vessel ischemic disease. Vascular: Calcified atherosclerosis at the skull base. No hyperdense vessel. Skull: No fracture or focal osseous lesion. Sinuses/Orbits: Small mucous retention cysts in the maxillary sinuses. Clear mastoid air cells. Unremarkable orbits. 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 ASPECTS [facility] Stroke Program Early CT Score) - Ganglionic level infarction (caudate, lentiform nuclei, internal capsule, insula, M1-M3 cortex): 7 - Supraganglionic infarction (M4-M6 cortex): 3 Total score (0-10 with 10 being normal): 10 IMPRESSION: 1. No evidence of acute intracranial abnormality. 2. ASPECTS is 10. 3. Moderate chronic small  vessel ischemic disease. 4. Chronic left cerebellar infarct. These results were called by telephone at the time of interpretation on 01/30/2018 at 3:52 pm to Dr. Scherrie November, who verbally acknowledged these results. Electronically Signed   By: Sebastian Ache M.D.   On: 01/30/2018 15:53    Assessment and Plan:   Remo Kirschenmann is a 85 y.o. male with a hx of atrial fibrillation, on anticoagulation therapy with apixaban, reported history of congestive heart failure, hypertension, hyperlipidemia and dementia, followed at the [facility] clinic, who is being seen today for the evaluation of bradycardia at the request of Dr. Isidoro Donning, Internal Medicine.  1. Bradycardia/ Pauess: EKGs at time of admission showed atrial fibrillation with a slow ventricular response in the 30s. Tele review demonstrates several pauses, the longest 3.10 sec.  This is in the setting of recent treatment with clonidine.  First dose of clonidine was taken night of January 29, 2018 which was prescribed by his PCP for management of hypertension. He was also on Coreg prior to admission but this was also held due to bradycardia.  He is currently not on any other AV nodal blocking agents.  TSH is elevated at 7.371.  Free T3 and T4 both pending.  Potassium was also elevated earlier today at 5.7.  Repeat  BMP pending (spironolactone discontinued). Per History from Pt's wife, he has conduction disease. He wore a heart monitor ~3 years ago at the [place] and was diagnosed with atrial fibrillation and noted to have a "pause". A pacemaker was recommended but pt declined. Per wife, he has been on Coreg at home  and was having no issues until recently starting clonidine. There is no history of syncope/ near syncope with coreg. We recommend further avoidance of clonidine as well as any additional AV nodal blocking agents, other than prior home dose of Coreg. His HR seems to be improving and we may be able to resume prior Coreg dose for management of afib, if needed for HR control.    2.  Atrial fibrillation: Noted on EKG.  The patient has been followed at the [facility] clinic and was on Eliquis prior to admission thus suspect that atrial fibrillation is not a new diagnosis. His wife confirms that this was diagnosed by outpatient monitor several years ago.  He presented with a slow ventricular response in the setting of beta-blocker and clonidine.  Both medications are now on hold.  Recommend future avoidance of clonidine.  If heart rate improves, may later consider restarting low-dose beta-blocker. Continue Eliquis for anticoagulation.  As noted above, he was hyperkalemic today at 5.7.  Repeat BMP pending.  TSH also elevated.  Free T3-T4 pending.  3.  Hypertension: BP remains poorly controlled despite several antihypertensive agents.  He is currently on Lasix, hydralazine and lisinopril.  He was previously on spironolactone but this was discontinued due to hyperkalemia.  Coreg and clonidine have also been discontinued due to bradycardia.  Echocardiogram shows moderate LVH.  Study was technically insufficient to allow for evaluation of diastolic function given atrial fibrillation.  Renal artery Dopplers are pending to rule out renal artery stenosis.  Aldosterone + renin activity with ratio labs pending.  Thyroid function test also pending.   For questions or updates, please contact [facility] Please consult [email or web address] for contact info under    Signed, Robbie Lis, PA-C  01/31/2018 1:31 PM  Personally seen and examined. Agree with above.  85 year old [facility] with advanced [facility], history of atrial fibrillation permanent with tachycardia-bradycardia syndrome with occasional heart rates in the 40s.  About 3 years ago was noted to have pauses with his atrial fibrillation and pacemaker was recommended but declined.  He is done quite well since then from a cardiac standpoint.  No history of syncope.  New medication is clonidine, has been on Coreg at home.  Having no  issues.  Hypertension has been an issue for him.  Currently in bed, family member at bedside, jovial but confused.  GEN: Well nourished, well developed, in no acute distress  HEENT: normal  Neck: no JVD, carotid bruits, or masses Cardiac: Irregularly regular normal rate; no murmurs, rubs, or gallops,no edema  Respiratory:  clear to auscultation bilaterally, normal work of breathing GI: soft, nontender, nondistended, + BS MS: no deformity or atrophy  Skin: warm and dry, no rash Neuro:  Alert but confused  psych: euthymic mood, full affect  Lab work reviewed.  Assessment and plan  Permanent atrial fibrillation with tachycardia-bradycardia syndrome  -New medication clonidine, can affect sinus node and sometimes contribute to bradycardia.  Since this is a new medication I think it makes sense to discontinue it and see how he does with his home dose of carvedilol.  Perhaps tomorrow restart his carvedilol.  He has not had any episodes of syncope. -No indication for pacemaker at this time.  I would try to avoid invasive procedures on him given his advanced dementia.  Continue with Eliquis if felt appropriate.  We will follow along.  Donato Schultz, MD   Donato Schultz, MD

## 2018-01-31 NOTE — Progress Notes (Signed)
Bilateral renal arterial duplex exam completed. Please see more details on CV PROC under chart review. Kier Smead H Dontrell Stuck(RDMS RVT) 01/31/18 3:55 PM

## 2018-01-31 NOTE — Progress Notes (Signed)
[facility]                                                                              Patient Demographics  Tyler Carr, is a 85 y.o. male, DOB - 11-15-1930, MCN:[id number]  Admit date - 01/30/2018   Admitting Physician Inez Catalina, MD  Outpatient Primary MD for the patient is Patient, No Pcp Per  Outpatient specialists:   LOS - 1  days   Medical records reviewed and are as summarized below:    chief complaint : Slurred speech      Brief summary   Patient is 85 year old male with CAD, CHF, possibly Afib, dementia who presentedfor slurred speech, [facility]  History per daughter, patient had gone to [place] PCP a day before the mission and was started on clonidine for elevated blood pressure in 200s.  Patient took the clonidine the first time overnight and when he woke up on the morning of admission, had swelling of the lips and slurred speech.  In ED, he was bradycardiac with heart rate in 30s. He was assessed by neurology, CT head only showed chronic changes, symptoms are unlikely due to acute stroke.  Assessment & Plan    Principal Problem: Symptomatic bradycardia -Currently no slurred speech, per daughter at the bedside, close to his baseline -Heart rate in 70s to 80s on telemetry, NSR -Likely due to combination of clonidine, Coreg, Norvasc.  He also reported nausea, no vomiting, possibly underlying vagal effect as well -Continue to hold clonidine and Coreg.  Norvasc was also placed on hold last night.  Cardiology was consulted.  Active Problems: Accelerated hypertension -BP currently in 200s at the time of my examination, placed on hydralazine, continue Lasix, lisinopril, Aldactone -Obtain 2D echo, none on the record, rule out renal artery stenosis, obtain renal artery duplex -Obtain renin, aldosterone   Hyperkalemia with mild acute renal insufficiency, unclear if patient has CKD -Baseline creatinine unknown -Hold Aldactone, potassium 5.7 -  lokelma 10g x1   Hypothyroidism -TSH 7.3, obtain free T4 and T3  History of CHF (congestive heart failure) (HCC), unknown if systolic or diastolic -Obtain 2D echocardiogram for further work-up    Hyperlipidemia Continue simvastatin  Slurred speech -Resolved likely due to accelerated hypertension, bradycardia -Seen by neurology  Dementia -Currently stable, hold melatonin  ?  Paroxysmal atrial fibrillation Currently on apixaban, rate controlled  Code Status: Full code DVT Prophylaxis: Apixaban Family Communication: Discussed in detail with the patient, all imaging results, lab results explained to the patient and daughter   Disposition Plan: Possibly tomorrow, will observe today for symptomatic bradycardia, cardiology evaluation pending  Time Spent in minutes   25 minutes  Procedures:  None  Consultants:   Cardiology Neurology  Antimicrobials:   Anti-infectives (From admission, onward)   None         Medications  Scheduled Meds: . apixaban  5 mg Oral BID  . furosemide  40 mg Oral Daily  . hydrALAZINE  25 mg Oral Q8H  . Influenza vac split quadrivalent PF  0.5 mL Intramuscular Tomorrow-1000  . lisinopril  40 mg Oral Daily  . pneumococcal 23 valent vaccine  0.5 mL Intramuscular Tomorrow-1000  . simvastatin  20 mg Oral QHS  . sodium chloride flush  3 mL Intravenous Q12H  . spironolactone  25 mg Oral Daily   Continuous Infusions: PRN Meds:.acetaminophen **OR** acetaminophen, hydrALAZINE      Subjective:   Tyler Carr was seen and examined today.  BP still uncontrolled, denies any chest pain or shortness of breath, headache or blurry vision. Daughter at bedside states slurred speech has resolved, mental status close to baseline.  Patient denies dizziness, chest pain, shortness of breath, abdominal pain, N/V/D/C, new weakness, numbess, tingling.   Objective:   Vitals:   01/31/18 0502 01/31/18 0600 01/31/18 0812 01/31/18 0956  BP: (!) 198/88 (!)  200/106 (!) 160/88 (!) 191/76  Pulse:   74   Resp: 18 16 19    Temp: 97.8 F (36.6 C)  97.7 F (36.5 C)   TempSrc: Oral  Oral   SpO2:   98%   Weight:      Height:        Intake/Output Summary (Last 24 hours) at 01/31/2018 1036 Last data filed at 01/31/2018 0900 Gross per 24 hour  Intake 0 ml  Output 0 ml  Net 0 ml     Wt Readings from Last 3 Encounters:  01/31/18 69.9 kg     Exam  General: Alert and oriented x 3, NAD  Eyes: PERRLA, EOMI,  HEENT:    Cardiovascular: S1 S2 auscultated, regular rate and rhythm.  Respiratory: Clear to auscultation bilaterally, no wheezing, rales or rhonchi  Gastrointestinal: Soft, nontender, nondistended, + bowel sounds  Ext: no pedal edema bilaterally  Neuro: No new deficit  Musculoskeletal: No digital cyanosis, clubbing  Skin: No rashes  Psych: Normal affect and demeanor, alert and oriented x3    Data Reviewed:  I have personally reviewed following labs and imaging studies  Micro Results Recent Results (from the past 240 hour(s))  MRSA PCR Screening     Status: None   Collection Time: 01/30/18  8:50 PM  Result Value Ref Range Status   MRSA by PCR NEGATIVE NEGATIVE Final    Comment:        The GeneXpert MRSA Assay (FDA approved for NASAL specimens only), is one component of a comprehensive MRSA colonization surveillance program. It is not intended to diagnose MRSA infection nor to guide or monitor treatment for MRSA infections. Performed at  [facility], [address] [place], [place] [address]     Radiology Reports Ct Head Code Stroke Wo Contrast  Result Date: 01/30/2018 CLINICAL DATA:  Code stroke.  Slurred speech. EXAM: CT HEAD WITHOUT CONTRAST TECHNIQUE: Contiguous axial images were obtained from the base of the skull through the vertex without intravenous contrast. COMPARISON:  None. FINDINGS: Brain: There is no evidence of acute infarct, intracranial hemorrhage, mass, midline shift, or extra-axial  fluid collection. Generalized cerebral atrophy is relatively mild for age. There is a small chronic left cerebellar infarct. Patchy hypodensities in the cerebral white matter bilaterally are nonspecific but compatible with moderate chronic small vessel ischemic disease. Vascular: Calcified atherosclerosis at the skull base. No hyperdense vessel. Skull: No fracture or focal osseous lesion. Sinuses/Orbits: Small mucous retention cysts in the maxillary sinuses. Clear mastoid air cells. Unremarkable orbits. 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 ASPECTS [facility] Stroke Program Early CT Score) - Ganglionic level infarction (caudate, lentiform nuclei, internal capsule, insula, M1-M3 cortex): 7 - Supraganglionic infarction (M4-M6 cortex): 3 Total score (0-10 with 10 being normal): 10 IMPRESSION: 1. No evidence  of acute intracranial abnormality. 2. ASPECTS is 10. 3. Moderate chronic small vessel ischemic disease. 4. Chronic left cerebellar infarct. These results were called by telephone at the time of interpretation on 01/30/2018 at 3:52 pm to Dr. Scherrie Novembersias, who verbally acknowledged these results. Electronically Signed   By: Sebastian AcheAllen  Grady M.D.   On: 01/30/2018 15:53    Lab Data:  CBC: Recent Labs  Lab 01/30/18 1536 01/30/18 1552  WBC 4.9  --   NEUTROABS 3.0  --   HGB 12.3* 13.3  HCT 40.4 39.0  MCV 97.3  --   PLT 121*  --    Basic Metabolic Panel: Recent Labs  Lab 01/30/18 1536 01/30/18 1552 01/31/18 0643  NA 142 140 140  K 4.2 4.0 5.7*  CL 110 108 107  CO2 22  --  24  GLUCOSE 89 84 109*  BUN 24* 25* 22  CREATININE 1.29* 1.20 1.31*  CALCIUM 9.1  --  9.2   GFR: Estimated Creatinine Clearance: 40.8 mL/min (A) (by C-G formula based on SCr of 1.31 mg/dL (H)). Liver Function Tests: Recent Labs  Lab 01/30/18 1536  AST 23  ALT 16  ALKPHOS 46  BILITOT 0.8  PROT 6.5  ALBUMIN 3.7   No results for input(s): LIPASE, AMYLASE in the last 168 hours. No results for input(s): AMMONIA in the last  168 hours. Coagulation Profile: Recent Labs  Lab 01/30/18 1536  INR 1.34   Cardiac Enzymes: No results for input(s): CKTOTAL, CKMB, CKMBINDEX, TROPONINI in the last 168 hours. BNP (last 3 results) No results for input(s): PROBNP in the last 8760 hours. HbA1C: No results for input(s): HGBA1C in the last 72 hours. CBG: Recent Labs  Lab 01/30/18 1539 01/30/18 1840  GLUCAP 77 81   Lipid Profile: No results for input(s): CHOL, HDL, LDLCALC, TRIG, CHOLHDL, LDLDIRECT in the last 72 hours. Thyroid Function Tests: Recent Labs    01/31/18 0808  TSH 7.371*   Anemia Panel: No results for input(s): VITAMINB12, FOLATE, FERRITIN, TIBC, IRON, RETICCTPCT in the last 72 hours. Urine analysis:    Component Value Date/Time   COLORURINE YELLOW 01/30/2018 1746   APPEARANCEUR CLEAR 01/30/2018 1746   LABSPEC 1.016 01/30/2018 1746   PHURINE 6.0 01/30/2018 1746   GLUCOSEU NEGATIVE 01/30/2018 1746   HGBUR NEGATIVE 01/30/2018 1746   BILIRUBINUR NEGATIVE 01/30/2018 1746   KETONESUR NEGATIVE 01/30/2018 1746   PROTEINUR NEGATIVE 01/30/2018 1746   NITRITE NEGATIVE 01/30/2018 1746   LEUKOCYTESUR NEGATIVE 01/30/2018 1746     Ripudeep Rai M.D. [facility] 01/31/2018, 10:36 AM  Pager: [phone] Between 7am to 7pm - call Pager - [phone]  After 7pm go to [email or web address] - password [email or web address]  Call night coverage person covering after 7pm

## 2018-02-01 ENCOUNTER — Encounter (HOSPITAL_COMMUNITY): Payer: Self-pay | Admitting: Family

## 2018-02-01 DIAGNOSIS — I10 Essential (primary) hypertension: Secondary | ICD-10-CM

## 2018-02-01 DIAGNOSIS — I495 Sick sinus syndrome: Secondary | ICD-10-CM

## 2018-02-01 DIAGNOSIS — I701 Atherosclerosis of renal artery: Secondary | ICD-10-CM

## 2018-02-01 LAB — BASIC METABOLIC PANEL
Anion gap: 11 (ref 5–15)
BUN: 25 mg/dL — ABNORMAL HIGH (ref 8–23)
CO2: 25 mmol/L (ref 22–32)
Calcium: 9.5 mg/dL (ref 8.9–10.3)
Chloride: 104 mmol/L (ref 98–111)
Creatinine, Ser: 1.35 mg/dL — ABNORMAL HIGH (ref 0.61–1.24)
GFR calc Af Amer: 55 mL/min — ABNORMAL LOW (ref >60)
GFR calc non Af Amer: 48 mL/min — ABNORMAL LOW (ref >60)
Glucose, Bld: 89 mg/dL (ref 70–99)
POTASSIUM: 4.2 mmol/L (ref 3.5–5.1)
Sodium: 140 mmol/L (ref 135–145)

## 2018-02-01 LAB — T3: T3, Total: 118 ng/dL (ref 71–180)

## 2018-02-01 MED ORDER — TAMSULOSIN HCL 0.4 MG PO CAPS: 0.4000 mg | ORAL_CAPSULE | Freq: Every day | ORAL | Status: DC

## 2018-02-01 MED ORDER — TAMSULOSIN HCL 0.4 MG PO CAPS
0.4000 mg | ORAL_CAPSULE | Freq: Every day | ORAL | Status: DC
  Administered 2018-02-01 – 2018-02-05 (×5): 0.4 mg via ORAL
  Filled 2018-02-01 (×6): qty 1

## 2018-02-01 MED ORDER — CARVEDILOL 12.5 MG PO TABS
12.5000 mg | ORAL_TABLET | Freq: Two times a day (BID) | ORAL | Status: DC
  Administered 2018-02-01 – 2018-02-05 (×8): 12.5 mg via ORAL
  Filled 2018-02-01 (×9): qty 1

## 2018-02-01 MED ORDER — HEPARIN (PORCINE) 25000 UT/250ML-% IV SOLN
850.0000 [IU]/h | INTRAVENOUS | Status: DC
  Administered 2018-02-01: 1000 [IU]/h via INTRAVENOUS
  Administered 2018-02-02 – 2018-02-04 (×2): 850 [IU]/h via INTRAVENOUS
  Filled 2018-02-01 (×3): qty 250

## 2018-02-01 NOTE — Progress Notes (Signed)
Progress Note  Patient Name: Tyler Carr Date of Encounter: 02/01/2018  Primary Cardiologist: New to [facility] (goes to [facility] clinic) Primary Electrophysiologist:  None   Subjective   The patient denies any complains.  Inpatient Medications    Scheduled Meds: . amLODipine  5 mg Oral Daily  . apixaban  5 mg Oral BID  . carvedilol  12.5 mg Oral BID  . furosemide  40 mg Oral Daily  . hydrALAZINE  50 mg Oral Q8H  . Influenza vac split quadrivalent PF  0.5 mL Intramuscular Tomorrow-1000  . lisinopril  40 mg Oral Daily  . pneumococcal 23 valent vaccine  0.5 mL Intramuscular Tomorrow-1000  . simvastatin  20 mg Oral QHS  . sodium chloride flush  3 mL Intravenous Q12H  . tamsulosin  0.4 mg Oral Daily   Continuous Infusions:  PRN Meds: acetaminophen **OR** acetaminophen, hydrALAZINE   Vital Signs    Vitals:   01/31/18 2341 02/01/18 0632 02/01/18 0636 02/01/18 0805  BP:  (!) 150/77 (!) 150/77 (!) 151/72  Pulse: 73   79  Resp: (!) 22     Temp: 98.3 F (36.8 C)   98 F (36.7 C)  TempSrc: Oral   Oral  SpO2: 100%   (!) 86%  Weight:      Height:        Intake/Output Summary (Last 24 hours) at 02/01/2018 0901 Last data filed at 02/01/2018 0000 Gross per 24 hour  Intake 0 ml  Output 0 ml  Net 0 ml   Last 3 Weights 01/31/2018 01/30/2018  Weight (lbs) 154 lb 162 lb 14.7 oz  Weight (kg) 69.854 kg 73.9 kg      Telemetry    On and off but HR' in 65' and 80' - Personally Reviewed  ECG    No new - Personally Reviewed  Physical Exam   GEN: No acute distress.   Neck: No JVD Cardiac: iRRR, 2/6 systolic and diastolic murmur, no rubs, or gallops.  Respiratory: Clear to auscultation bilaterally. GI: Soft, nontender, non-distended  MS: No edema; No deformity. Neuro:  Nonfocal  Psych: Normal affect   Labs    Chemistry Recent Labs  Lab 01/30/18 1536 01/30/18 1552 01/31/18 0643 02/01/18 0249  NA 142 140 140 140  K 4.2 4.0 5.7* 4.2  CL 110 108 107 104  CO2 22  --  24  25  GLUCOSE 89 84 109* 89  BUN 24* 25* 22 25*  CREATININE 1.29* 1.20 1.31* 1.35*  CALCIUM 9.1  --  9.2 9.5  PROT 6.5  --   --   --   ALBUMIN 3.7  --   --   --   AST 23  --   --   --   ALT 16  --   --   --   ALKPHOS 46  --   --   --   BILITOT 0.8  --   --   --   GFRNONAA 50*  --  49* 48*  GFRAA 58*  --  57* 55*  ANIONGAP 10  --  9 11    Hematology Recent Labs  Lab 01/30/18 1536 01/30/18 1552  WBC 4.9  --   RBC 4.15*  --   HGB 12.3* 13.3  HCT 40.4 39.0  MCV 97.3  --   MCH 29.6  --   MCHC 30.4  --   RDW 13.9  --   PLT 121*  --    Cardiac EnzymesNo results for input(s):  TROPONINI in the last 168 hours.  Recent Labs  Lab 01/30/18 1550  TROPIPOC 0.01    BNPNo results for input(s): BNP, PROBNP in the last 168 hours.   DDimer No results for input(s): DDIMER in the last 168 hours.   Radiology    Ct Head Code Stroke Wo Contrast  Result Date: 01/30/2018 CLINICAL DATA:  Code stroke.  Slurred speech. EXAM: CT HEAD WITHOUT CONTRAST TECHNIQUE: Contiguous axial images were obtained from the base of the skull through the vertex without intravenous contrast. COMPARISON:  None. FINDINGS: Brain: There is no evidence of acute infarct, intracranial hemorrhage, mass, midline shift, or extra-axial fluid collection. Generalized cerebral atrophy is relatively mild for age. There is a small chronic left cerebellar infarct. Patchy hypodensities in the cerebral white matter bilaterally are nonspecific but compatible with moderate chronic small vessel ischemic disease. Vascular: Calcified atherosclerosis at the skull base. No hyperdense vessel. Skull: No fracture or focal osseous lesion. Sinuses/Orbits: Small mucous retention cysts in the maxillary sinuses. Clear mastoid air cells. Unremarkable orbits. 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 ASPECTS [facility] Stroke Program Early CT Score) - Ganglionic level infarction (caudate, lentiform nuclei, internal capsule, insula, M1-M3 cortex): 7 -  Supraganglionic infarction (M4-M6 cortex): 3 Total score (0-10 with 10 being normal): 10 IMPRESSION: 1. No evidence of acute intracranial abnormality. 2. ASPECTS is 10. 3. Moderate chronic small vessel ischemic disease. 4. Chronic left cerebellar infarct. These results were called by telephone at the time of interpretation on 01/30/2018 at 3:52 pm to Dr. Scherrie November, who verbally acknowledged these results. Electronically Signed   By: Sebastian Ache M.D.   On: 01/30/2018 15:53   Cardiac Studies     Patient Profile     85 y.o. male with advanced dementia[facility], history of atrial fibrillation permanent with tachycardia-bradycardia syndrome with occasional heart rates in the 40s.  About 3 years ago was noted to have pauses with his atrial fibrillation and pacemaker was recommended but declined.  He is done quite well since then from a cardiac standpoint.  No history of syncope.  Assessment & Plan    Permanent atrial fibrillation with tachycardia-bradycardia syndrome - telemetry on and off but no bradycardia or pauses, HR in 70' and 80'  -New medication clonidine, can affect sinus node and sometimes contribute to bradycardia.  Since this is a new medication I think it makes sense to discontinue it and see how he does with his home dose of carvedilol.  - I will increase carvedilol to 25 mg po BID ( his home dose) - He has not had any episodes of syncope. -No indication for pacemaker at this time.  I would try to avoid invasive procedures on him given his advanced dementia. - continue with Eliquis   2.  Hypertension: BP remains elevated, but has improved overnight, now in 150', I will increase carvedilol - his renal arterial US shows > 60% stenosis on the right, he is to be seen by Dr Myra Gianotti from vascular surgery - his echo shows mild LV systolic dysfunction, LVEF 50%, moderate concentric LVH  For questions or updates, please contact [facility] Please consult [email or web address] for contact info  under     Signed, Tobias Alexander, MD  02/01/2018, 9:01 AM

## 2018-02-01 NOTE — Progress Notes (Addendum)
[facility]                                                                              Patient Demographics  Tyler Carr, is a 85 y.o. male, DOB - 01/12/1931, [id number]  Admit date - 01/30/2018   Admitting Physician Inez CatalinaEmily B Mullen, MD  Outpatient Primary MD for the patient is Patient, No Pcp Per  Outpatient specialists:   LOS - 2  days   Medical records reviewed and are as summarized below:    chief complaint : Slurred speech      Brief summary   Patient is 85 year old male with CAD, CHF, possibly Afib, dementia who presentedfor slurred speech, [facility]  History per daughter, [place] [facility] and was started on clonidine for elevated blood pressure in 200s.  Patient took the clonidine the first time overnight and when he woke up on the morning of admission, had swelling of the lips and slurred speech.  In ED, he was bradycardiac with heart rate in 30s. He was assessed by neurology, CT head only showed chronic changes, symptoms are unlikely due to acute stroke.  Assessment & Plan    Principal Problem: Symptomatic bradycardia -Currently no slurred speech, per daughter at the bedside, close to his baseline -Heart rate in 70s to 80s on telemetry, NSR -Likely due to combination of clonidine, Coreg, Norvasc.  He also reported nausea, no vomiting, possibly underlying vagal effect as well -No further bradycardia, restarted Coreg.  Clonidine has been discontinued, explained to the daughter.   Active Problems: Accelerated hypertension, hypertension uncontrolled -BP remains elevated, placed on Coreg on his outpatient dose.   -2D echo showed EF of 50% with moderate concentric LVH, mild LV systolic dysfunction -Renal artery duplex showed > 60% stenosis on the right with elevated resistance index, left 1-59% gnosis.  Discussed with Dr. Myra GianottiBrabham this morning, will likely need renal angiogram, will await further  recommendations after evaluation.   Hyperkalemia with mild acute renal insufficiency, unclear if patient has CKD -Baseline creatinine unknown -Continue to hold Aldactone, potassium now stable, given Lokelma on 1/17   Hypothyroidism -TSH 7.3, free T4 and T3 normal  History of CHF (congestive heart failure) (HCC), unknown if systolic or diastolic -2D echo showed EF of 50% with moderate concentric LVH, mild LV systolic dysfunction    Hyperlipidemia Continue simvastatin  Slurred speech -Resolved likely due to accelerated hypertension, bradycardia -Seen by neurology, no further work-up  Dementia -Currently stable, hold melatonin  Permanent atrial fibrillation tachybradycardia syndrome -Continue Coreg, no episodes of syncope while inpatient -Cardiology following, no indication for pacemaker at this time -Continue Eliquis  Addendum -Reviewed vascular surgery recommendations, hold Eliquis for angiogram and renal stent - will place on heparin drip   Code Status: Full code DVT Prophylaxis: Apixaban Family Communication: Discussed in detail with the patient, all imaging results, lab results explained to the patient and daughter   Disposition Plan: Await vascular surgery evaluation  Time Spent in minutes   25 minutes  Procedures:  2D echo Renal arterial duplex  Consultants:   Cardiology Neurology  Antimicrobials:  Anti-infectives (From admission, onward)   None         Medications  Scheduled Meds: . amLODipine  5 mg Oral Daily  . apixaban  5 mg Oral BID  . carvedilol  12.5 mg Oral BID  . furosemide  40 mg Oral Daily  . hydrALAZINE  50 mg Oral Q8H  . Influenza vac split quadrivalent PF  0.5 mL Intramuscular Tomorrow-1000  . lisinopril  40 mg Oral Daily  . pneumococcal 23 valent vaccine  0.5 mL Intramuscular Tomorrow-1000  . simvastatin  20 mg Oral QHS  . sodium chloride flush  3 mL Intravenous Q12H  . tamsulosin  0.4 mg Oral Daily   Continuous  Infusions: PRN Meds:.acetaminophen **OR** acetaminophen, hydrALAZINE      Subjective:   Tyler Carr was seen and examined today.  BP elevated but improving from yesterday.  No chest pain, shortness of breath, headache or blurry vision.  Slurred speech has resolved.  Mental status at baseline per daughter at the bedside.  Patient denies dizziness, chest pain, shortness of breath, abdominal pain, N/V/D/C, new weakness, numbess, tingling.   Objective:   Vitals:   01/31/18 2341 02/01/18 0632 02/01/18 0636 02/01/18 0805  BP:  (!) 150/77 (!) 150/77 (!) 151/72  Pulse: 73   79  Resp: (!) 22     Temp: 98.3 F (36.8 C)   98 F (36.7 C)  TempSrc: Oral   Oral  SpO2: 100%   (!) 86%  Weight:      Height:        Intake/Output Summary (Last 24 hours) at 02/01/2018 1058 Last data filed at 02/01/2018 0900 Gross per 24 hour  Intake 240 ml  Output 0 ml  Net 240 ml     Wt Readings from Last 3 Encounters:  01/31/18 69.9 kg    Physical Exam  General: Alert and oriented, NAD  Eyes:   HEENT:  Atraumatic, normocephalic  Cardiovascular: S1 S2 clear, 2/6 systolic murmur, RRR. No pedal edema b/l  Respiratory: CTAB, no wheezing, rales or rhonchi  Gastrointestinal: Soft, nontender, nondistended, NBS  Ext: no pedal edema bilaterally  Neuro: no new deficits  Musculoskeletal: No cyanosis, clubbing  Skin: No rashes  Psych:    Data Reviewed:  I have personally reviewed following labs and imaging studies  Micro Results Recent Results (from the past 240 hour(s))  MRSA PCR Screening     Status: None   Collection Time: 01/30/18  8:50 PM  Result Value Ref Range Status   MRSA by PCR NEGATIVE NEGATIVE Final    Comment:        The GeneXpert MRSA Assay (FDA approved for NASAL specimens only), is one component of a comprehensive MRSA colonization surveillance program. It is not intended to diagnose MRSA infection nor to guide or monitor treatment for MRSA infections. Performed  at [facility], [address] [place], [place] [address]     Radiology Reports Ct Head Code Stroke Wo Contrast  Result Date: 01/30/2018 CLINICAL DATA:  Code stroke.  Slurred speech. EXAM: CT HEAD WITHOUT CONTRAST TECHNIQUE: Contiguous axial images were obtained from the base of the skull through the vertex without intravenous contrast. COMPARISON:  None. FINDINGS: Brain: There is no evidence of acute infarct, intracranial hemorrhage, mass, midline shift, or extra-axial fluid collection. Generalized cerebral atrophy is relatively mild for age. There is a small chronic left cerebellar infarct. Patchy hypodensities in the cerebral white matter bilaterally are nonspecific but compatible with moderate chronic small vessel ischemic  disease. Vascular: Calcified atherosclerosis at the skull base. No hyperdense vessel. Skull: No fracture or focal osseous lesion. Sinuses/Orbits: Small mucous retention cysts in the maxillary sinuses. Clear mastoid air cells. Unremarkable orbits. 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 ASPECTS [facility] Stroke Program Early CT Score) - Ganglionic level infarction (caudate, lentiform nuclei, internal capsule, insula, M1-M3 cortex): 7 - Supraganglionic infarction (M4-M6 cortex): 3 Total score (0-10 with 10 being normal): 10 IMPRESSION: 1. No evidence of acute intracranial abnormality. 2. ASPECTS is 10. 3. Moderate chronic small vessel ischemic disease. 4. Chronic left cerebellar infarct. These results were called by telephone at the time of interpretation on 01/30/2018 at 3:52 pm to Dr. Scherrie November, who verbally acknowledged these results. Electronically Signed   By: Sebastian Ache M.D.   On: 01/30/2018 15:53   Vas US Renal Artery Duplex  Result Date: 01/31/2018 ABDOMINAL VISCERAL Indications: HTN Accelerated High Risk Factors: Hypertension. Limitations: Air/bowel gas, patient discomfort and cannot hold breath and short of breath. Comparison Study: No comparison study available  Performing Technologist: Melodie Bouillon  Examination Guidelines: A complete evaluation includes B-mode imaging, spectral Doppler, color Doppler, and power Doppler as needed of all accessible portions of each vessel. Bilateral testing is considered an integral part of a complete examination. Limited examinations for reoccurring indications may be performed as noted.  Duplex Findings: +--------------------+--------+--------+------+--------+ Mesenteric          PSV cm/sEDV cm/sPlaqueComments +--------------------+--------+--------+------+--------+ Aorta Mid              64      17                  +--------------------+--------+--------+------+--------+ Celiac Artery Origin  187      20                  +--------------------+--------+--------+------+--------+ SMA Origin            392                          +--------------------+--------+--------+------+--------+  +------------------+--------+--------+-------+ Right Renal ArteryPSV cm/sEDV cm/sComment +------------------+--------+--------+-------+ Origin              225      55           +------------------+--------+--------+-------+ Proximal             76      19           +------------------+--------+--------+-------+ Mid                  95      27           +------------------+--------+--------+-------+ Distal               93      27           +------------------+--------+--------+-------+ +-----------------+--------+--------+-------+ Left Renal ArteryPSV cm/sEDV cm/sComment +-----------------+--------+--------+-------+ Origin             125      30           +-----------------+--------+--------+-------+ Proximal           130      43           +-----------------+--------+--------+-------+ Mid                 29      7            +-----------------+--------+--------+-------+ Distal  36      7            +-----------------+--------+--------+-------+  +------------+--------+--------+----+-----------+--------+--------+----+ Right KidneyPSV cm/sEDV cm/sRI  Left KidneyPSV cm/sEDV cm/sRI   +------------+--------+--------+----+-----------+--------+--------+----+ Upper Pole  25      6       0.78Upper Pole 23      9       0.48 +------------+--------+--------+----+-----------+--------+--------+----+ Mid         31      6       0.        23      7       0.68 +------------+--------+--------+----+-----------+--------+--------+----+ Lower Pole  21      6       0.72Lower Pole 25      10      0.61 +------------+--------+--------+----+-----------+--------+--------+----+ Hilar       25      7       0.70Hilar      25      8       0.68 +------------+--------+--------+----+-----------+--------+--------+----+ +------------------+-----+------------------+----+ Right Kidney           Left Kidney            +------------------+-----+------------------+----+ RAR                    RAR                    +------------------+-----+------------------+----+ RAR (manual)      3.52 RAR (manual)      2.03 +------------------+-----+------------------+----+ Cortex                 Cortex                 +------------------+-----+------------------+----+ Cortex thickness       Corex thickness        +------------------+-----+------------------+----+ Kidney length (cm)10.80Kidney length (cm)9.90 +------------------+-----+------------------+----+  Summary: Renal:  Right: Evidence of a greater than 60% stenosis of the right renal        artery. RRV flow present. Abnormal right Resistive Index.        Normal size right kidney. Left:  1-59% stenosis of the left renal artery. LRV flow present.        Normal left Resistive Index. Normal size of left kidney.        Cyst(s) noted.        Incidental finding: Multiple cystic structure seen on the        border of left kidney, further evaluation needed through        other  modality if clinical concern. Mesenteric: Normal Celiac artery findings. 70 to 99% stenosis in the superior mesenteric artery.  *See table(s) above for measurements and observations.     Preliminary     Lab Data:  CBC: Recent Labs  Lab 01/30/18 1536 01/30/18 1552  WBC 4.9  --   NEUTROABS 3.0  --   HGB 12.3* 13.3  HCT 40.4 39.0  MCV 97.3  --   PLT 121*  --    Basic Metabolic Panel: Recent Labs  Lab 01/30/18 1536 01/30/18 1552 01/31/18 0643 02/01/18 0249  NA 142 140 140 140  K 4.2 4.0 5.7* 4.2  CL 110 108 107 104  CO2 22  --  24 25  GLUCOSE 89 84 109* 89  BUN 24* 25* 22 25*  CREATININE 1.29* 1.20 1.31* 1.35*  CALCIUM 9.1  --  9.2 9.5   GFR: Estimated Creatinine Clearance:  39.6 mL/min (A) (by C-G formula based on SCr of 1.35 mg/dL (H)). Liver Function Tests: Recent Labs  Lab 01/30/18 1536  AST 23  ALT 16  ALKPHOS 46  BILITOT 0.8  PROT 6.5  ALBUMIN 3.7   No results for input(s): LIPASE, AMYLASE in the last 168 hours. No results for input(s): AMMONIA in the last 168 hours. Coagulation Profile: Recent Labs  Lab 01/30/18 1536  INR 1.34   Cardiac Enzymes: No results for input(s): CKTOTAL, CKMB, CKMBINDEX, TROPONINI in the last 168 hours. BNP (last 3 results) No results for input(s): PROBNP in the last 8760 hours. HbA1C: No results for input(s): HGBA1C in the last 72 hours. CBG: Recent Labs  Lab 01/30/18 1539 01/30/18 1840 01/31/18 1149  GLUCAP 77 81 66*   Lipid Profile: No results for input(s): CHOL, HDL, LDLCALC, TRIG, CHOLHDL, LDLDIRECT in the last 72 hours. Thyroid Function Tests: Recent Labs    01/31/18 0808 01/31/18 1118  TSH 7.371*  --   FREET4  --  0.93   Anemia Panel: No results for input(s): VITAMINB12, FOLATE, FERRITIN, TIBC, IRON, RETICCTPCT in the last 72 hours. Urine analysis:    Component Value Date/Time   COLORURINE YELLOW 01/30/2018 1746   APPEARANCEUR CLEAR 01/30/2018 1746   LABSPEC 1.016 01/30/2018 1746   PHURINE 6.0  01/30/2018 1746   GLUCOSEU NEGATIVE 01/30/2018 1746   HGBUR NEGATIVE 01/30/2018 1746   BILIRUBINUR NEGATIVE 01/30/2018 1746   KETONESUR NEGATIVE 01/30/2018 1746   PROTEINUR NEGATIVE 01/30/2018 1746   NITRITE NEGATIVE 01/30/2018 1746   LEUKOCYTESUR NEGATIVE 01/30/2018 1746       M.D. [facility] 02/01/2018, 10:57 AM  Pager: [phone] Between 7am to 7pm - call Pager - [phone]  After 7pm go to [email or web address] - password [email or web address]  Call night coverage person covering after 7pm

## 2018-02-01 NOTE — Progress Notes (Signed)
Spoke with CCMD regarding pt's telemetry. Pt continues to pull of telemetry leads due to baseline dementia. Dr. Isidoro Donning made aware.   Leonidas Romberg, RN

## 2018-02-01 NOTE — H&P (View-Only) (Signed)
[facility] [place]  Patient name: Tyler Carr MRN: [id number] DOB: 1930-06-05 Sex: male   REQUESTING PROVIDER:    Dr.Rai   REASON FOR CONSULT:    Renal artery stenosis  HISTORY OF PRESENT ILLNESS:   Tyler Carr is a 85 y.o. male, who has a history of coronary artery disease congestive heart failure atrial fibrillation and dementia who was admitted with slurred speech.  He had bradycardia with heart rate in the 30s on admission.  Neurology saw him on admission and did not feel this was an acute stroke.  He is on Eliquis for presumed atrial fibrillation.  He has had difficult to control hypertension, now on 5 medications.  A renal artery duplex showed right renal artery stenosis.  Kidney length was normal.  His creatinine is 1.35.  PAST MEDICAL HISTORY    Past Medical History:  Diagnosis Date  . CHF (congestive heart failure) (HCC)   . Hyperlipidemia   . Hypertension      FAMILY HISTORY   Family History  Problem Relation Age of Onset  . Stroke Mother     SOCIAL HISTORY:   Social History   Socioeconomic History  . Marital status: Married    Spouse name: Not on file  . Number of children: Not on file  . Years of education: Not on file  . Highest education level: Not on file  Occupational History  . Not on file  Social Needs  . Financial resource strain: Patient refused  . Food insecurity:    Worry: Patient refused    Inability: Patient refused  . Transportation needs:    Medical: Patient refused    Non-medical: Patient refused  Tobacco Use  . Smoking status: Never Smoker  . Smokeless tobacco: Never Used  Substance and Sexual Activity  . Alcohol use: Not Currently  . Drug use: Not Currently  . Sexual activity: Not Currently  Lifestyle  . Physical activity:    Days per week: Patient refused    Minutes per session: Patient refused  . Stress: Patient refused  Relationships  . Social  connections:    Talks on phone: Patient refused    Gets together: Patient refused    Attends religious service: Patient refused    Active member of club or organization: Patient refused    Attends meetings of clubs or organizations: Patient refused    Relationship status: Patient refused  . Intimate partner violence:    Fear of current or ex partner: Patient refused    Emotionally abused: Patient refused    Physically abused: Patient refused    Forced sexual activity: Patient refused  Other Topics Concern  . Not on file  Social History Narrative  . Not on file    ALLERGIES:    Not on File  CURRENT MEDICATIONS:    Current Facility-Administered Medications  Medication Dose Route Frequency Provider Last Rate Last Dose  . acetaminophen (TYLENOL) tablet 650 mg  650 mg Oral Q6H PRN Inez Catalina, MD       Or  . acetaminophen (TYLENOL) suppository 650 mg  650 mg Rectal Q6H PRN Debe Coder B, MD      . amLODipine (NORVASC) tablet 5 mg  5 mg Oral Daily Rai, Ripudeep K, MD   5 mg at 02/01/18 0937  . apixaban (ELIQUIS) tablet 5 mg  5 mg Oral BID Debe Coder B, MD   5 mg at 02/01/18 0938  . carvedilol (COREG) tablet 12.5 mg  12.5 mg  Oral BID Rai, Ripudeep K, MD   12.5 mg at 02/01/18 6701  . furosemide (LASIX) tablet 40 mg  40 mg Oral Daily Debe Coder B, MD   40 mg at 02/01/18 4103  . hydrALAZINE (APRESOLINE) tablet 10 mg  10 mg Oral Q6H PRN Rai, Ripudeep K, MD      . hydrALAZINE (APRESOLINE) tablet 50 mg  50 mg Oral Q8H Rai, Ripudeep K, MD   50 mg at 02/01/18 0636  . Influenza vac split quadrivalent PF (FLUZONE HIGH-DOSE) injection 0.5 mL  0.5 mL Intramuscular Tomorrow-1000 Debe Coder B, MD      . lisinopril (PRINIVIL,ZESTRIL) tablet 40 mg  40 mg Oral Daily Debe Coder B, MD   40 mg at 02/01/18 0131  . pneumococcal 23 valent vaccine (PNU-IMMUNE) injection 0.5 mL  0.5 mL Intramuscular Tomorrow-1000 Debe Coder B, MD      . simvastatin (ZOCOR) tablet 20 mg  20 mg Oral QHS  Debe Coder B, MD   20 mg at 01/31/18 2245  . sodium chloride flush (NS) 0.9 % injection 3 mL  3 mL Intravenous Q12H Debe Coder B, MD   3 mL at 01/31/18 2246  . tamsulosin (FLOMAX) capsule 0.4 mg  0.4 mg Oral Daily Rai, Ripudeep K, MD   0.4 mg at 02/01/18 4388    REVIEW OF SYSTEMS:   [X]  denotes positive finding, [ ]  denotes negative finding Cardiac  Comments:  Chest pain or chest pressure:    Shortness of breath upon exertion:    Short of breath when lying flat:    Irregular heart rhythm:        Vascular    Pain in calf, thigh, or hip brought on by ambulation:    Pain in feet at night that wakes you up from your sleep:     Blood clot in your veins:    Leg swelling:         Pulmonary    Oxygen at home:    Productive cough:     Wheezing:         Neurologic    Sudden weakness in arms or legs:     Sudden numbness in arms or legs:     Sudden onset of difficulty speaking or slurred speech:    Temporary loss of vision in one eye:     Problems with dizziness:         Gastrointestinal    Blood in stool:      Vomited blood:         Genitourinary    Burning when urinating:     Blood in urine:        Psychiatric    Major depression:         Hematologic    Bleeding problems:    Problems with blood clotting too easily:        Skin    Rashes or ulcers:        Constitutional    Fever or chills:     PHYSICAL EXAM:   Vitals:   01/31/18 2341 02/01/18 0632 02/01/18 0636 02/01/18 0805  BP:  (!) 150/77 (!) 150/77 (!) 151/72  Pulse: 73   79  Resp: (!) 22     Temp: 98.3 F (36.8 C)   98 F (36.7 C)  TempSrc: Oral   Oral  SpO2: 100%   (!) 86%  Weight:      Height:        GENERAL: The patient is a  well-nourished male, in no acute distress. The vital signs are documented above. CARDIAC: There is a regular rate and rhythm.  PULMONARY: Nonlabored respirations ABDOMEN: Soft and non-tender  MUSCULOSKELETAL: There are no major deformities or cyanosis. NEUROLOGIC: No  focal weakness or paresthesias are detected. SKIN: There are no ulcers or rashes noted. PSYCHIATRIC: The patient has a normal affect.  STUDIES:   I have reviewed his ultrasound with the following findings:  Right: Evidence of a greater than 60% stenosis of the right renal        artery. RRV flow present. Abnormal right Resistive Index.        Normal size right kidney. Left:  1-59% stenosis of the left renal artery. LRV flow present.        Normal left Resistive Index. Normal size of left kidney.        Cyst(s) noted.        Incidental finding: Multiple cystic structure seen on the        border of left kidney, further evaluation needed through        other modality if clinical concern. ASSESSMENT and PLAN   Right renal artery stenosis in the setting of difficult to control hypertension: I discussed with the patient and his family at the bedside that his renal artery stenosis could be contributing to his blood pressure difficulties.  I have recommended angiography to better evaluate this.  We will proceed with stenting if it is felt that the lesion is hemodynamically significant.  I discussed with them that this may improve his blood pressure, however it may not.  The earliest I can get to him is on Tuesday.  He will need to be off of his Eliquis for 48 hours.  He will be n.p.o. after midnight Monday.   Durene CalWells Megan Presti, MD [facility] Tel [phone] Pager [phone]

## 2018-02-01 NOTE — Consult Note (Signed)
[facility] [place]  Patient name: Tyler Carr MRN: [id number] DOB: 1930-06-05 Sex: male   REQUESTING PROVIDER:    Dr.Rai   REASON FOR CONSULT:    Renal artery stenosis  HISTORY OF PRESENT ILLNESS:   Tyler Carr is a 85 y.o. male, who has a history of coronary artery disease congestive heart failure atrial fibrillation and dementia who was admitted with slurred speech.  He had bradycardia with heart rate in the 30s on admission.  Neurology saw him on admission and did not feel this was an acute stroke.  He is on Eliquis for presumed atrial fibrillation.  He has had difficult to control hypertension, now on 5 medications.  A renal artery duplex showed right renal artery stenosis.  Kidney length was normal.  His creatinine is 1.35.  PAST MEDICAL HISTORY    Past Medical History:  Diagnosis Date  . CHF (congestive heart failure) (HCC)   . Hyperlipidemia   . Hypertension      FAMILY HISTORY   Family History  Problem Relation Age of Onset  . Stroke Mother     SOCIAL HISTORY:   Social History   Socioeconomic History  . Marital status: Married    Spouse name: Not on file  . Number of children: Not on file  . Years of education: Not on file  . Highest education level: Not on file  Occupational History  . Not on file  Social Needs  . Financial resource strain: Patient refused  . Food insecurity:    Worry: Patient refused    Inability: Patient refused  . Transportation needs:    Medical: Patient refused    Non-medical: Patient refused  Tobacco Use  . Smoking status: Never Smoker  . Smokeless tobacco: Never Used  Substance and Sexual Activity  . Alcohol use: Not Currently  . Drug use: Not Currently  . Sexual activity: Not Currently  Lifestyle  . Physical activity:    Days per week: Patient refused    Minutes per session: Patient refused  . Stress: Patient refused  Relationships  . Social  connections:    Talks on phone: Patient refused    Gets together: Patient refused    Attends religious service: Patient refused    Active member of club or organization: Patient refused    Attends meetings of clubs or organizations: Patient refused    Relationship status: Patient refused  . Intimate partner violence:    Fear of current or ex partner: Patient refused    Emotionally abused: Patient refused    Physically abused: Patient refused    Forced sexual activity: Patient refused  Other Topics Concern  . Not on file  Social History Narrative  . Not on file    ALLERGIES:    Not on File  CURRENT MEDICATIONS:    Current Facility-Administered Medications  Medication Dose Route Frequency Provider Last Rate Last Dose  . acetaminophen (TYLENOL) tablet 650 mg  650 mg Oral Q6H PRN Inez Catalina, MD       Or  . acetaminophen (TYLENOL) suppository 650 mg  650 mg Rectal Q6H PRN Debe Coder B, MD      . amLODipine (NORVASC) tablet 5 mg  5 mg Oral Daily Rai, Ripudeep K, MD   5 mg at 02/01/18 0937  . apixaban (ELIQUIS) tablet 5 mg  5 mg Oral BID Debe Coder B, MD   5 mg at 02/01/18 0938  . carvedilol (COREG) tablet 12.5 mg  12.5 mg  Oral BID Rai, Ripudeep K, MD   12.5 mg at 02/01/18 6701  . furosemide (LASIX) tablet 40 mg  40 mg Oral Daily Debe Coder B, MD   40 mg at 02/01/18 4103  . hydrALAZINE (APRESOLINE) tablet 10 mg  10 mg Oral Q6H PRN Rai, Ripudeep K, MD      . hydrALAZINE (APRESOLINE) tablet 50 mg  50 mg Oral Q8H Rai, Ripudeep K, MD   50 mg at 02/01/18 0636  . Influenza vac split quadrivalent PF (FLUZONE HIGH-DOSE) injection 0.5 mL  0.5 mL Intramuscular Tomorrow-1000 Debe Coder B, MD      . lisinopril (PRINIVIL,ZESTRIL) tablet 40 mg  40 mg Oral Daily Debe Coder B, MD   40 mg at 02/01/18 0131  . pneumococcal 23 valent vaccine (PNU-IMMUNE) injection 0.5 mL  0.5 mL Intramuscular Tomorrow-1000 Debe Coder B, MD      . simvastatin (ZOCOR) tablet 20 mg  20 mg Oral QHS  Debe Coder B, MD   20 mg at 01/31/18 2245  . sodium chloride flush (NS) 0.9 % injection 3 mL  3 mL Intravenous Q12H Debe Coder B, MD   3 mL at 01/31/18 2246  . tamsulosin (FLOMAX) capsule 0.4 mg  0.4 mg Oral Daily Rai, Ripudeep K, MD   0.4 mg at 02/01/18 4388    REVIEW OF SYSTEMS:   [X]  denotes positive finding, [ ]  denotes negative finding Cardiac  Comments:  Chest pain or chest pressure:    Shortness of breath upon exertion:    Short of breath when lying flat:    Irregular heart rhythm:        Vascular    Pain in calf, thigh, or hip brought on by ambulation:    Pain in feet at night that wakes you up from your sleep:     Blood clot in your veins:    Leg swelling:         Pulmonary    Oxygen at home:    Productive cough:     Wheezing:         Neurologic    Sudden weakness in arms or legs:     Sudden numbness in arms or legs:     Sudden onset of difficulty speaking or slurred speech:    Temporary loss of vision in one eye:     Problems with dizziness:         Gastrointestinal    Blood in stool:      Vomited blood:         Genitourinary    Burning when urinating:     Blood in urine:        Psychiatric    Major depression:         Hematologic    Bleeding problems:    Problems with blood clotting too easily:        Skin    Rashes or ulcers:        Constitutional    Fever or chills:     PHYSICAL EXAM:   Vitals:   01/31/18 2341 02/01/18 0632 02/01/18 0636 02/01/18 0805  BP:  (!) 150/77 (!) 150/77 (!) 151/72  Pulse: 73   79  Resp: (!) 22     Temp: 98.3 F (36.8 C)   98 F (36.7 C)  TempSrc: Oral   Oral  SpO2: 100%   (!) 86%  Weight:      Height:        GENERAL: The patient is a  well-nourished male, in no acute distress. The vital signs are documented above. CARDIAC: There is a regular rate and rhythm.  PULMONARY: Nonlabored respirations ABDOMEN: Soft and non-tender  MUSCULOSKELETAL: There are no major deformities or cyanosis. NEUROLOGIC: No  focal weakness or paresthesias are detected. SKIN: There are no ulcers or rashes noted. PSYCHIATRIC: The patient has a normal affect.  STUDIES:   I have reviewed his ultrasound with the following findings:  Right: Evidence of a greater than 60% stenosis of the right renal        artery. RRV flow present. Abnormal right Resistive Index.        Normal size right kidney. Left:  1-59% stenosis of the left renal artery. LRV flow present.        Normal left Resistive Index. Normal size of left kidney.        Cyst(s) noted.        Incidental finding: Multiple cystic structure seen on the        border of left kidney, further evaluation needed through        other modality if clinical concern. ASSESSMENT and PLAN   Right renal artery stenosis in the setting of difficult to control hypertension: I discussed with the patient and his family at the bedside that his renal artery stenosis could be contributing to his blood pressure difficulties.  I have recommended angiography to better evaluate this.  We will proceed with stenting if it is felt that the lesion is hemodynamically significant.  I discussed with them that this may improve his blood pressure, however it may not.  The earliest I can get to him is on Tuesday.  He will need to be off of his Eliquis for 48 hours.  He will be n.p.o. after midnight Monday.   Wells Rishikesh Khachatryan, MD [facility] [place] Tel [phone] Pager [phone] 

## 2018-02-01 NOTE — Progress Notes (Signed)
ANTICOAGULATION CONSULT NOTE - Initial Consult  Pharmacy Consult for Eliquis >> heparin Indication: atrial fibrillation  Not on File  Patient Measurements: Height: 5\' 10"  (177.8 cm) Weight: 154 lb (69.9 kg) IBW/kg (Calculated) : 73 Heparin Dosing Weight: 70kg  Vital Signs: Temp: 98 F (36.7 C) (01/18 1539) Temp Source: Oral (01/18 1539) BP: 117/55 (01/18 1539) Pulse Rate: 65 (01/18 1539)  Labs: Recent Labs    01/30/18 1536 01/30/18 1552 01/31/18 0643 02/01/18 0249  HGB 12.3* 13.3  --   --   HCT 40.4 39.0  --   --   PLT 121*  --   --   --   APTT 37*  --   --   --   LABPROT 16.5*  --   --   --   INR 1.34  --   --   --   CREATININE 1.29* 1.20 1.31* 1.35*    Estimated Creatinine Clearance: 39.6 mL/min (A) (by C-G formula based on SCr of 1.35 mg/dL (H)).   Medical History: Past Medical History:  Diagnosis Date  . CHF (congestive heart failure) (HCC)   . Hyperlipidemia   . Hypertension      Assessment: 87 yoM on Eliquis for PAF to transition to heparin with need for procedures. Last dose of Eliquis given this morning around 0930, so will start heparin at 2130. Will dose with aPTT initially.  Goal of Therapy:  Heparin level 0.3-0.7 units/ml aPTT 66-102 seconds Monitor platelets by anticoagulation protocol: Yes   Plan:  -Hold Eliquis -Start heparin 1000 units/hr at 2130 -Check 8hr aPTT and heparin level  Fredonia Highland, PharmD, BCPS Clinical Pharmacist Please check AMION for all [facility] Pharmacy numbers 02/01/2018

## 2018-02-02 DIAGNOSIS — I10 Essential (primary) hypertension: Secondary | ICD-10-CM

## 2018-02-02 DIAGNOSIS — I4891 Unspecified atrial fibrillation: Secondary | ICD-10-CM

## 2018-02-02 DIAGNOSIS — I5021 Acute systolic (congestive) heart failure: Secondary | ICD-10-CM

## 2018-02-02 LAB — BASIC METABOLIC PANEL
Anion gap: 8 (ref 5–15)
BUN: 20 mg/dL (ref 8–23)
CHLORIDE: 107 mmol/L (ref 98–111)
CO2: 25 mmol/L (ref 22–32)
CREATININE: 1.29 mg/dL — AB (ref 0.61–1.24)
Calcium: 8.8 mg/dL — ABNORMAL LOW (ref 8.9–10.3)
GFR calc Af Amer: 58 mL/min — ABNORMAL LOW (ref >60)
GFR calc non Af Amer: 50 mL/min — ABNORMAL LOW (ref >60)
Glucose, Bld: 93 mg/dL (ref 70–99)
Potassium: 3.7 mmol/L (ref 3.5–5.1)
Sodium: 140 mmol/L (ref 135–145)

## 2018-02-02 LAB — APTT
aPTT: 94 seconds — ABNORMAL HIGH (ref 24–36)
aPTT: 120 seconds — ABNORMAL HIGH (ref 24–36)
aPTT: 87 seconds — ABNORMAL HIGH (ref 24–36)

## 2018-02-02 LAB — HEPARIN LEVEL (UNFRACTIONATED): Heparin Unfractionated: 1.56 IU/mL — ABNORMAL HIGH (ref 0.30–0.70)

## 2018-02-02 NOTE — Progress Notes (Signed)
ANTICOAGULATION CONSULT NOTE  Pharmacy Consult for heparin Indication: atrial fibrillation  Not on File  Patient Measurements: Height: 5\' 10"  (177.8 cm) Weight: 145 lb 4.5 oz (65.9 kg) IBW/kg (Calculated) : 73 Heparin Dosing Weight: 70kg  Vital Signs: Temp: 97.9 F (36.6 C) (01/19 1147) Temp Source: Oral (01/19 1147) BP: 139/72 (01/19 1147) Pulse Rate: 82 (01/19 1147)  Labs: Recent Labs    01/30/18 1536 01/30/18 1552 01/31/18 0643 02/01/18 0249 02/02/18 0535 02/02/18 1140  HGB 12.3* 13.3  --   --   --   --   HCT 40.4 39.0  --   --   --   --   PLT 121*  --   --   --   --   --   APTT 37*  --   --   --  94* 120*  LABPROT 16.5*  --   --   --   --   --   INR 1.34  --   --   --   --   --   HEPARINUNFRC  --   --   --   --  1.56*  --   CREATININE 1.29* 1.20 1.31* 1.35* 1.29*  --     Estimated Creatinine Clearance: 39 mL/min (A) (by C-G formula based on SCr of 1.29 mg/dL (H)).   Assessment: 85 y.o. male with h/o Afib, Eliquis on hold while awaiting angiogram, he continues on IV heparin.   Repeat aptt was above goal at 120s, will adjust rate and recheck level tonight.   Goal of Therapy:  Heparin level 0.3-0.7 units/ml aPTT 66-102 seconds Monitor platelets by anticoagulation protocol: Yes   Plan:  Decrease Heparin rate to 850 units/hr Recheck aPTT in 8 hours  Sheppard Coil PharmD., BCPS Clinical Pharmacist 02/02/2018 1:00 PM

## 2018-02-02 NOTE — Progress Notes (Signed)
Progress Note  Patient Name: Tyler Carr Date of Encounter: 02/02/2018  Primary Cardiologist[facility] Primary Electrophysiologist:  None   Subjective   The patient denies any symptoms  Inpatient Medications    Scheduled Meds: . amLODipine  5 mg Oral Daily  . carvedilol  12.5 mg Oral BID  . furosemide  40 mg Oral Daily  . hydrALAZINE  50 mg Oral Q8H  . lisinopril  40 mg Oral Daily  . simvastatin  20 mg Oral QHS  . sodium chloride flush  3 mL Intravenous Q12H  . tamsulosin  0.4 mg Oral Daily   Continuous Infusions: . heparin 1,000 Units/hr (02/02/18 0800)   PRN Meds: acetaminophen **OR** acetaminophen, hydrALAZINE   Vital Signs    Vitals:   02/02/18 0649 02/02/18 0910 02/02/18 1033 02/02/18 1147  BP: (!) 193/87 (!) 98/45 108/74 139/72  Pulse:  77  82  Resp:  18 20 15   Temp:  98.1 F (36.7 C)  97.9 F (36.6 C)  TempSrc:  Oral  Oral  SpO2:  98%  98%  Weight:      Height:        Intake/Output Summary (Last 24 hours) at 02/02/2018 1247 Last data filed at 02/02/2018 0800 Gross per 24 hour  Intake 344.68 ml  Output 0 ml  Net 344.68 ml   Last 3 Weights 02/02/2018 01/31/2018 01/30/2018  Weight (lbs) 145 lb 4.5 oz 154 lb 162 lb 14.7 oz  Weight (kg) 65.9 kg 69.854 kg 73.9 kg      Telemetry    On and off but HR' in 3470' and 80' - Personally Reviewed  ECG    No new - Personally Reviewed  Physical Exam   GEN: No acute distress.   Neck: No JVD Cardiac: iRRR, 2/6 systolic and diastolic murmur, no rubs, or gallops.  Respiratory: Clear to auscultation bilaterally. GI: Soft, nontender, non-distended  MS: No edema; No deformity. Neuro:  Nonfocal  Psych: Normal affect   Labs    Chemistry Recent Labs  Lab 01/30/18 1536  01/31/18 0643 02/01/18 0249 02/02/18 0535  NA 142   < > 140 140 140  K 4.2   < > 5.7* 4.2 3.7  CL 110   < > 107 104 107  CO2 22  --  24 25 25   GLUCOSE 89   < > 109* 89 93  BUN 24*   < > 22 25* 20  CREATININE  1.29*   < > 1.31* 1.35* 1.29*  CALCIUM 9.1  --  9.2 9.5 8.8*  PROT 6.5  --   --   --   --   ALBUMIN 3.7  --   --   --   --   AST 23  --   --   --   --   ALT 16  --   --   --   --   ALKPHOS 46  --   --   --   --   BILITOT 0.8  --   --   --   --   GFRNONAA 50*  --  49* 48* 50*  GFRAA 58*  --  57* 55* 58*  ANIONGAP 10  --  9 11 8    < > = values in this interval not displayed.    Hematology Recent Labs  Lab 01/30/18 1536 01/30/18 1552  WBC 4.9  --   RBC 4.15*  --   HGB 12.3* 13.3  HCT  40.4 39.0  MCV 97.3  --   MCH 29.6  --   MCHC 30.4  --   RDW 13.9  --   PLT 121*  --    Cardiac EnzymesNo results for input(s): TROPONINI in the last 168 hours.  Recent Labs  Lab 01/30/18 1550  TROPIPOC 0.01    BNPNo results for input(s): BNP, PROBNP in the last 168 hours.   DDimer No results for input(s): DDIMER in the last 168 hours.   Radiology    Ct Head Code Stroke Wo Contrast  Result Date: 01/30/2018 CLINICAL DATA:  Code stroke.  Slurred speech. EXAM: CT HEAD WITHOUT CONTRAST TECHNIQUE: Contiguous axial images were obtained from the base of the skull through the vertex without intravenous contrast. COMPARISON:  None. FINDINGS: Brain: There is no evidence of acute infarct, intracranial hemorrhage, mass, midline shift, or extra-axial fluid collection. Generalized cerebral atrophy is relatively mild for age. There is a small chronic left cerebellar infarct. Patchy hypodensities in the cerebral white matter bilaterally are nonspecific but compatible with moderate chronic small vessel ischemic disease. Vascular: Calcified atherosclerosis at the skull base. No hyperdense vessel. Skull: No fracture or focal osseous lesion. Sinuses/Orbits: Small mucous retention cysts in the maxillary sinuses. Clear mastoid air cells. Unremarkable orbits. 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 ASPECTS [facility] Stroke Program Early CT Score) - Ganglionic level infarction (caudate, lentiform nuclei, internal  capsule, insula, M1-M3 cortex): 7 - Supraganglionic infarction (M4-M6 cortex): 3 Total score (0-10 with 10 being normal): 10 IMPRESSION: 1. No evidence of acute intracranial abnormality. 2. ASPECTS is 10. 3. Moderate chronic small vessel ischemic disease. 4. Chronic left cerebellar infarct. These results were called by telephone at the time of interpretation on 01/30/2018 at 3:52 pm to Dr. Scherrie November, who verbally acknowledged these results. Electronically Signed   By: Sebastian Ache M.D.   On: 01/30/2018 15:53   Cardiac Studies     Patient Profile     85 y.o. male with advanced dementia[facility], history of atrial fibrillation permanent with tachycardia-bradycardia syndrome with occasional heart rates in the 40s.  About 3 years ago was noted to have pauses with his atrial fibrillation and pacemaker was recommended but declined.  He is done quite well since then from a cardiac standpoint.  No history of syncope.  Assessment & Plan    Permanent atrial fibrillation with tachycardia-bradycardia syndrome - telemetry on and off but no bradycardia or pauses, HR in 80-90'  -New medication clonidine, can affect sinus node and sometimes contribute to bradycardia.  Since this is a new medication I think it makes sense to discontinue it and see how he does with his home dose of carvedilol.  - I increased carvedilol to 25 mg po BID ( his home dose) yesterday but his BP dropped, so it was decreased back to 12.5 mg PO BID. - He has not had any episodes of syncope. -No indication for pacemaker at this time.  I would try to avoid invasive procedures on him given his advanced dementia. - continue with Eliquis   2.  Hypertension: BP labile,  - his renal arterial US shows > 60% stenosis on the right, he is to be seen by Dr Myra Gianotti from vascular surgery,  - his echo shows mild LV systolic dysfunction, LVEF 50%, moderate concentric LVH  For questions or updates, please contact [facility] Please consult  [email or web address] for contact info under     Signed, Tobias Alexander, MD  02/02/2018, 12:47 PM

## 2018-02-02 NOTE — Progress Notes (Signed)
ANTICOAGULATION CONSULT NOTE  Pharmacy Consult for heparin Indication: atrial fibrillation  Not on File  Patient Measurements: Height: 5\' 10"  (177.8 cm) Weight: 154 lb (69.9 kg) IBW/kg (Calculated) : 73 Heparin Dosing Weight: 70kg  Vital Signs: Temp: 98.3 F (36.8 C) (01/19 0453) Temp Source: Oral (01/19 0453) BP: 155/75 (01/19 0453) Pulse Rate: 85 (01/19 0453)  Labs: Recent Labs    01/30/18 1536 01/30/18 1552 01/31/18 0643 02/01/18 0249 02/02/18 0535  HGB 12.3* 13.3  --   --   --   HCT 40.4 39.0  --   --   --   PLT 121*  --   --   --   --   APTT 37*  --   --   --  94*  LABPROT 16.5*  --   --   --   --   INR 1.34  --   --   --   --   CREATININE 1.29* 1.20 1.31* 1.35*  --     Estimated Creatinine Clearance: 39.6 mL/min (A) (by C-G formula based on SCr of 1.35 mg/dL (H)).   Assessment: 85 y.o. male with h/o Afib, Eliquis on hold while awaiting angiogram, for heparin  Goal of Therapy:  Heparin level 0.3-0.7 units/ml aPTT 66-102 seconds Monitor platelets by anticoagulation protocol: Yes   Plan:  Continue Heparin at current rate  Recheck aPTT in 6 hours  Geannie Risen, PharmD, BCPS  02/02/2018

## 2018-02-02 NOTE — Progress Notes (Signed)
   02/02/18 1100  Clinical Encounter Type  Visited With Patient;Family  Visit Type Initial;Other (Comment) (AD request)  Referral From Nurse  Spiritual Encounters  Spiritual Needs Emotional;Literature  Stress Factors  Patient Stress Factors Health changes   Met w/ pt in response to [facility] consult for AD.  Pt states he doesn't recall being asked about it and had not requested information (he was very kind and appreciative of me coming just the same).  Left packet w/ him.  Male relative in rm w/ him who had been on a call (daughter or niece?) let me know as I left that she had asked about it and will look over and talk w/ pt about it. Enter consult in Epic if pt decides he wants to proceed.  Myra Gianotti resident, [phone]

## 2018-02-02 NOTE — Progress Notes (Signed)
ANTICOAGULATION CONSULT NOTE  Pharmacy Consult for heparin Indication: atrial fibrillation  Not on File  Patient Measurements: Height: 5\' 10"  (177.8 cm) Weight: 145 lb 4.5 oz (65.9 kg) IBW/kg (Calculated) : 73 Heparin Dosing Weight: 70kg  Vital Signs: Temp: 98.2 F (36.8 C) (01/19 1905) Temp Source: Oral (01/19 1905) BP: 156/72 (01/19 2111) Pulse Rate: 60 (01/19 1905)  Labs: Recent Labs    01/31/18 0643 02/01/18 0249 02/02/18 0535 02/02/18 1140 02/02/18 2229  APTT  --   --  94* 120* 87*  HEPARINUNFRC  --   --  1.56*  --   --   CREATININE 1.31* 1.35* 1.29*  --   --     Estimated Creatinine Clearance: 39 mL/min (A) (by C-G formula based on SCr of 1.29 mg/dL (H)).   Assessment: 85 y.o. male with h/o Afib, Eliquis on hold while awaiting angiogram, for heparin  Goal of Therapy:  Heparin level 0.3-0.7 units/ml aPTT 66-102 seconds Monitor platelets by anticoagulation protocol: Yes   Plan:  Continue Heparin at current rate  Follow-up am labs.   Geannie Risen, PharmD, BCPS  02/02/2018

## 2018-02-02 NOTE — Progress Notes (Addendum)
[facility]                                                                              Patient Demographics  Tyler Carr, is a 85 y.o. male, DOB - 08/31/1930, ZOX:[id number]  Admit date - 01/30/2018   Admitting Physician Inez CatalinaEmily B Mullen, MD  Outpatient Primary MD for the patient is Patient, No Pcp Per  Outpatient specialists:   LOS - 3  days   Medical records reviewed and are as summarized below:    chief complaint : Slurred speech      Brief summary   Patient is 85 year old male with CAD, CHF, possibly Afib, dementia who presentedfor slurred speech, [facility]  History per [place], [place] [facility] and was started on clonidine for elevated blood pressure in 200s.  Patient took the clonidine the first time overnight and when he woke up on the morning of admission, had swelling of the lips and slurred speech.  In ED, he was bradycardiac with heart rate in 30s. He was assessed by neurology, CT head only showed chronic changes, symptoms are unlikely due to acute stroke.  Assessment & Plan    Principal Problem: Symptomatic bradycardia -Currently no slurred speech, heart rate in 70s and 80s -Likely due to combination of clonidine, Coreg, Norvasc.  He also reported nausea, no vomiting, possibly underlying vagal effect as well -No further bradycardia, restarted Coreg.   Active Problems: Accelerated hypertension, hypertension uncontrolled --2D echo showed EF of 50% with moderate concentric LVH, mild LV systolic dysfunction -Renal artery duplex showed > 60% stenosis on the right with elevated resistance index, left 1-59% gnosis. -Appreciate vascular surgery consultation, plan for angiography on Tuesday 1/21 -Hold Eliquis, placed on heparin drip  Hyperkalemia with mild acute renal insufficiency, unclear if patient has CKD -Baseline creatinine unknown -Continue to hold Aldactone  Hypothyroidism -TSH 7.3, free T4 and T3  normal  History of CHF (congestive heart failure) (HCC), unknown if systolic or diastolic -2D echo showed EF of 50% with moderate concentric LVH, mild LV systolic dysfunction    Hyperlipidemia Continue simvastatin  Slurred speech -Resolved likely due to accelerated hypertension, bradycardia -Seen by neurology, no further work-up  Dementia -Currently stable, hold melatonin  Permanent atrial fibrillation tachybradycardia syndrome -Continue Coreg, no episodes of syncope while inpatient -Cardiology following, no indication for pacemaker at this time -Continue Eliquis   Code Status: Full code DVT Prophylaxis: Apixaban Family Communication: Discussed in detail with the patient, all imaging results, lab results explained to the patient and wife in the room   Disposition Plan: Plan for renal angiogram on Tuesday  Time Spent in minutes   25 minutes  Procedures:  2D echo Renal arterial duplex  Consultants:   Cardiology Neurology Vascular surgery  Antimicrobials:   Anti-infectives (From admission, onward)   None         Medications  Scheduled Meds: . amLODipine  5 mg Oral Daily  . carvedilol  12.5 mg Oral BID  . furosemide  40 mg Oral Daily  . hydrALAZINE  50 mg Oral Q8H  . lisinopril  40 mg  Oral Daily  . simvastatin  20 mg Oral QHS  . sodium chloride flush  3 mL Intravenous Q12H  . tamsulosin  0.4 mg Oral Daily   Continuous Infusions: . heparin 1,000 Units/hr (02/02/18 0800)   PRN Meds:.acetaminophen **OR** acetaminophen, hydrALAZINE      Subjective:   Tyler GuardianMarvin Burson was seen and examined today.  BP improving, wife at the bedside.  No acute issues.   Patient denies dizziness, chest pain, shortness of breath, abdominal pain, N/V/D/C, new weakness, numbess, tingling.   Objective:   Vitals:   02/02/18 0639 02/02/18 0649 02/02/18 0910 02/02/18 1033  BP: (!) 193/87 (!) 193/87 (!) 98/45 108/74  Pulse:   77   Resp: 14  18 20   Temp:   98.1 F (36.7 C)     TempSrc:   Oral   SpO2:   98%   Weight: 65.9 kg     Height:        Intake/Output Summary (Last 24 hours) at 02/02/2018 1101 Last data filed at 02/02/2018 0800 Gross per 24 hour  Intake 344.68 ml  Output 0 ml  Net 344.68 ml     Wt Readings from Last 3 Encounters:  02/02/18 65.9 kg    Physical Exam  General: Alert and oriented x 3, NAD  Eyes:   HEENT:  Atraumatic, normocephalic  Cardiovascular: S1 S2 clear, 2/6 systolic murmur  No pedal edema b/l  Respiratory: CTAB, no wheezing, rales or rhonchi  Gastrointestinal: Soft, nontender, nondistended, NBS  Ext: no pedal edema bilaterally  Neuro: no new deficits  Musculoskeletal: No cyanosis, clubbing  Skin: No rashes  Psych: Normal affect and demeanor,    Data Reviewed:  I have personally reviewed following labs and imaging studies  Micro Results Recent Results (from the past 240 hour(s))  MRSA PCR Screening     Status: None   Collection Time: 01/30/18  8:50 PM  Result Value Ref Range Status   MRSA by PCR NEGATIVE NEGATIVE Final    Comment:        The GeneXpert MRSA Assay (FDA approved for NASAL specimens only), is one component of a comprehensive MRSA colonization surveillance program. It is not intended to diagnose MRSA infection nor to guide or monitor treatment for MRSA infections. Performed at [facility], [address] [place], [place] [address]     Radiology Reports Ct Head Code Stroke Wo Contrast  Result Date: 01/30/2018 CLINICAL DATA:  Code stroke.  Slurred speech. EXAM: CT HEAD WITHOUT CONTRAST TECHNIQUE: Contiguous axial images were obtained from the base of the skull through the vertex without intravenous contrast. COMPARISON:  None. FINDINGS: Brain: There is no evidence of acute infarct, intracranial hemorrhage, mass, midline shift, or extra-axial fluid collection. Generalized cerebral atrophy is relatively mild for age. There is a small chronic left cerebellar infarct. Patchy  hypodensities in the cerebral white matter bilaterally are nonspecific but compatible with moderate chronic small vessel ischemic disease. Vascular: Calcified atherosclerosis at the skull base. No hyperdense vessel. Skull: No fracture or focal osseous lesion. Sinuses/Orbits: Small mucous retention cysts in the maxillary sinuses. Clear mastoid air cells. Unremarkable orbits. 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 ASPECTS [facility]([place] Stroke Program Early CT Score) - Ganglionic level infarction (caudate, lentiform nuclei, internal capsule, insula, M1-M3 cortex): 7 - Supraganglionic infarction (M4-M6 cortex): 3 Total score (0-10 with 10 being normal): 10 IMPRESSION: 1. No evidence of acute intracranial abnormality. 2. ASPECTS is 10. 3. Moderate chronic small vessel ischemic disease. 4. Chronic left cerebellar  infarct. These results were called by telephone at the time of interpretation on 01/30/2018 at 3:52 pm to Dr. Scherrie November, who verbally acknowledged these results. Electronically Signed   By: Sebastian Ache M.D.   On: 01/30/2018 15:53   Vas US Renal Artery Duplex  Result Date: 01/31/2018 ABDOMINAL VISCERAL Indications: HTN Accelerated High Risk Factors: Hypertension. Limitations: Air/bowel gas, patient discomfort and cannot hold breath and short of breath. Comparison Study: No comparison study available Performing Technologist: Melodie Bouillon  Examination Guidelines: A complete evaluation includes B-mode imaging, spectral Doppler, color Doppler, and power Doppler as needed of all accessible portions of each vessel. Bilateral testing is considered an integral part of a complete examination. Limited examinations for reoccurring indications may be performed as noted.  Duplex Findings: +--------------------+--------+--------+------+--------+ Mesenteric          PSV cm/sEDV cm/sPlaqueComments +--------------------+--------+--------+------+--------+ Aorta Mid              64      17                   +--------------------+--------+--------+------+--------+ Celiac Artery Origin  187      20                  +--------------------+--------+--------+------+--------+ SMA Origin            392                          +--------------------+--------+--------+------+--------+  +------------------+--------+--------+-------+ Right Renal ArteryPSV cm/sEDV cm/sComment +------------------+--------+--------+-------+ Origin              225      55           +------------------+--------+--------+-------+ Proximal             76      19           +------------------+--------+--------+-------+ Mid                  95      27           +------------------+--------+--------+-------+ Distal               93      27           +------------------+--------+--------+-------+ +-----------------+--------+--------+-------+ Left Renal ArteryPSV cm/sEDV cm/sComment +-----------------+--------+--------+-------+ Origin             125      30           +-----------------+--------+--------+-------+ Proximal           130      43           +-----------------+--------+--------+-------+ Mid                 29      7            +-----------------+--------+--------+-------+ Distal              36      7            +-----------------+--------+--------+-------+ +------------+--------+--------+----+-----------+--------+--------+----+ Right KidneyPSV cm/sEDV cm/sRI  Left KidneyPSV cm/sEDV cm/sRI   +------------+--------+--------+----+-----------+--------+--------+----+ Upper Pole  25      6       0.78Upper Pole 23      9       0.48 +------------+--------+--------+----+-----------+--------+--------+----+ Mid         31      6  0.82Mid        23      7       0.68 +------------+--------+--------+----+-----------+--------+--------+----+ Lower Pole  21      6       0.72Lower Pole 25      10      0.61  +------------+--------+--------+----+-----------+--------+--------+----+ Hilar       25      7       0.70Hilar      25      8       0.68 +------------+--------+--------+----+-----------+--------+--------+----+ +------------------+-----+------------------+----+ Right Kidney           Left Kidney            +------------------+-----+------------------+----+ RAR                    RAR                    +------------------+-----+------------------+----+ RAR (manual)      3.52 RAR (manual)      2.03 +------------------+-----+------------------+----+ Cortex                 Cortex                 +------------------+-----+------------------+----+ Cortex thickness       Corex thickness        +------------------+-----+------------------+----+ Kidney length (cm)10.80Kidney length (cm)9.90 +------------------+-----+------------------+----+  Summary: Renal:  Right: Evidence of a greater than 60% stenosis of the right renal        artery. RRV flow present. Abnormal right Resistive Index.        Normal size right kidney. Left:  1-59% stenosis of the left renal artery. LRV flow present.        Normal left Resistive Index. Normal size of left kidney.        Cyst(s) noted.        Incidental finding: Multiple cystic structure seen on the        border of left kidney, further evaluation needed through        other modality if clinical concern. Mesenteric: Normal Celiac artery findings. 70 to 99% stenosis in the superior mesenteric artery.  *See table(s) above for measurements and observations.     Preliminary     Lab Data:  CBC: Recent Labs  Lab 01/30/18 1536 01/30/18 1552  WBC 4.9  --   NEUTROABS 3.0  --   HGB 12.3* 13.3  HCT 40.4 39.0  MCV 97.3  --   PLT 121*  --    Basic Metabolic Panel: Recent Labs  Lab 01/30/18 1536 01/30/18 1552 01/31/18 0643 02/01/18 0249 02/02/18 0535  NA 142 140 140 140 140  K 4.2 4.0 5.7* 4.2 3.7  CL 110 108 107 104 107  CO2 22  --  24 25  25   GLUCOSE 89 84 109* 89 93  BUN 24* 25* 22 25* 20  CREATININE 1.29* 1.20 1.31* 1.35* 1.29*  CALCIUM 9.1  --  9.2 9.5 8.8*   GFR: Estimated Creatinine Clearance: 39 mL/min (A) (by C-G formula based on SCr of 1.29 mg/dL (H)). Liver Function Tests: Recent Labs  Lab 01/30/18 1536  AST 23  ALT 16  ALKPHOS 46  BILITOT 0.8  PROT 6.5  ALBUMIN 3.7   No results for input(s): LIPASE, AMYLASE in the last 168 hours. No results for input(s): AMMONIA in the last 168 hours. Coagulation Profile: Recent Labs  Lab 01/30/18 1536  INR 1.34   Cardiac  Enzymes: No results for input(s): CKTOTAL, CKMB, CKMBINDEX, TROPONINI in the last 168 hours. BNP (last 3 results) No results for input(s): PROBNP in the last 8760 hours. HbA1C: No results for input(s): HGBA1C in the last 72 hours. CBG: Recent Labs  Lab 01/30/18 1539 01/30/18 1840 01/31/18 1149  GLUCAP 77 81 66*   Lipid Profile: No results for input(s): CHOL, HDL, LDLCALC, TRIG, CHOLHDL, LDLDIRECT in the last 72 hours. Thyroid Function Tests: Recent Labs    01/31/18 0808 01/31/18 1118  TSH 7.371*  --   FREET4  --  0.93   Anemia Panel: No results for input(s): VITAMINB12, FOLATE, FERRITIN, TIBC, IRON, RETICCTPCT in the last 72 hours. Urine analysis:    Component Value Date/Time   COLORURINE YELLOW 01/30/2018 1746   APPEARANCEUR CLEAR 01/30/2018 1746   LABSPEC 1.016 01/30/2018 1746   PHURINE 6.0 01/30/2018 1746   GLUCOSEU NEGATIVE 01/30/2018 1746   HGBUR NEGATIVE 01/30/2018 1746   BILIRUBINUR NEGATIVE 01/30/2018 1746   KETONESUR NEGATIVE 01/30/2018 1746   PROTEINUR NEGATIVE 01/30/2018 1746   NITRITE NEGATIVE 01/30/2018 1746   LEUKOCYTESUR NEGATIVE 01/30/2018 1746     Ripudeep Rai M.D. [facility] 02/02/2018, 11:01 AM  Pager: [phone] Between 7am to 7pm - call Pager - [phone]  After 7pm go to [email or web address] - password [email or web address]  Call night coverage person covering after 7pm

## 2018-02-03 LAB — CBC
HCT: 36.9 % — ABNORMAL LOW (ref 39.0–52.0)
Hemoglobin: 12 g/dL — ABNORMAL LOW (ref 13.0–17.0)
MCH: 29.6 pg (ref 26.0–34.0)
MCHC: 32.5 g/dL (ref 30.0–36.0)
MCV: 91.1 fL (ref 80.0–100.0)
Platelets: 143 10*3/uL — ABNORMAL LOW (ref 150–400)
RBC: 4.05 MIL/uL — ABNORMAL LOW (ref 4.22–5.81)
RDW: 13.9 % (ref 11.5–15.5)
WBC: 5.6 10*3/uL (ref 4.0–10.5)
nRBC: 0 % (ref 0.0–0.2)

## 2018-02-03 LAB — BASIC METABOLIC PANEL
Anion gap: 9 (ref 5–15)
BUN: 16 mg/dL (ref 8–23)
CHLORIDE: 107 mmol/L (ref 98–111)
CO2: 24 mmol/L (ref 22–32)
Calcium: 8.7 mg/dL — ABNORMAL LOW (ref 8.9–10.3)
Creatinine, Ser: 1.29 mg/dL — ABNORMAL HIGH (ref 0.61–1.24)
GFR calc Af Amer: 58 mL/min — ABNORMAL LOW (ref >60)
GFR calc non Af Amer: 50 mL/min — ABNORMAL LOW (ref >60)
Glucose, Bld: 97 mg/dL (ref 70–99)
Potassium: 3.7 mmol/L (ref 3.5–5.1)
Sodium: 140 mmol/L (ref 135–145)

## 2018-02-03 LAB — HEPARIN LEVEL (UNFRACTIONATED): Heparin Unfractionated: 0.86 IU/mL — ABNORMAL HIGH (ref 0.30–0.70)

## 2018-02-03 LAB — APTT: aPTT: 77 seconds — ABNORMAL HIGH (ref 24–36)

## 2018-02-03 NOTE — Plan of Care (Signed)
  Problem: Education: Goal: Knowledge of disease or condition will improve Outcome: Progressing Goal: Understanding of medication regimen will improve Outcome: Progressing Goal: Individualized Educational Video(s) Outcome: Progressing   Problem: Activity: Goal: Ability to tolerate increased activity will improve Outcome: Progressing   Problem: Cardiac: Goal: Ability to achieve and maintain adequate cardiopulmonary perfusion will improve Outcome: Progressing   Problem: Health Behavior/Discharge Planning: Goal: Ability to safely manage health-related needs after discharge will improve Outcome: Progressing   Problem: Education: Goal: Knowledge of General Education information will improve Description Including pain rating scale, medication(s)/side effects and non-pharmacologic comfort measures Outcome: Progressing   Problem: Health Behavior/Discharge Planning: Goal: Ability to manage health-related needs will improve Outcome: Progressing   Problem: Clinical Measurements: Goal: Ability to maintain clinical measurements within normal limits will improve Outcome: Progressing Goal: Will remain free from infection Outcome: Progressing Goal: Diagnostic test results will improve Outcome: Progressing Goal: Respiratory complications will improve Outcome: Progressing Goal: Cardiovascular complication will be avoided Outcome: Progressing   Problem: Activity: Goal: Risk for activity intolerance will decrease Outcome: Progressing   Problem: Nutrition: Goal: Adequate nutrition will be maintained Outcome: Progressing   Problem: Coping: Goal: Level of anxiety will decrease Outcome: Progressing   Problem: Elimination: Goal: Will not experience complications related to bowel motility Outcome: Progressing Goal: Will not experience complications related to urinary retention Outcome: Progressing   Problem: Pain Managment: Goal: General experience of comfort will improve Outcome:  Progressing   Problem: Safety: Goal: Ability to remain free from injury will improve Outcome: Progressing   Problem: Skin Integrity: Goal: Risk for impaired skin integrity will decrease Outcome: Progressing   Problem: Spiritual Needs Goal: Ability to function at adequate level Outcome: Progressing   

## 2018-02-03 NOTE — Progress Notes (Signed)
Progress Note  Patient Name: Tyler Carr GuardianMarvin Engelstad Date of Encounter: 02/03/2018  Primary Cardiologist:   No primary care provider on file.   Subjective   No complaints.  No chest pain.  No dizziness.    Inpatient Medications    Scheduled Meds: . amLODipine  5 mg Oral Daily  . carvedilol  12.5 mg Oral BID  . furosemide  40 mg Oral Daily  . hydrALAZINE  50 mg Oral Q8H  . lisinopril  40 mg Oral Daily  . simvastatin  20 mg Oral QHS  . sodium chloride flush  3 mL Intravenous Q12H  . tamsulosin  0.4 mg Oral Daily   Continuous Infusions: . heparin 850 Units/hr (02/03/18 0800)   PRN Meds: acetaminophen **OR** acetaminophen, hydrALAZINE   Vital Signs    Vitals:   02/03/18 0542 02/03/18 0600 02/03/18 0800 02/03/18 0945  BP: (!) 159/92   (!) 127/57  Pulse:    71  Resp:  13    Temp:    98.8 F (37.1 C)  TempSrc:    Oral  SpO2:   98% 95%  Weight:  64.9 kg    Height:        Intake/Output Summary (Last 24 hours) at 02/03/2018 1113 Last data filed at 02/03/2018 1000 Gross per 24 hour  Intake 317.86 ml  Output -  Net 317.86 ml   Filed Weights   01/31/18 0500 02/02/18 0639 02/03/18 0600  Weight: 69.9 kg 65.9 kg 64.9 kg    Telemetry    Atrial fib with controlled rate.  No prolonged pauses.  PVCs - Personally Reviewed  ECG    NA - Personally Reviewed  Physical Exam   GEN: No acute distress.   Neck: No  JVD Cardiac: Irregular RR, no murmurs, rubs, or gallops.  Respiratory: Clear  to auscultation bilaterally. GI: Soft, nontender, non-distended  MS:   Trace edema; No deformity. Neuro:  Nonfocal , mildly confused.  Psych: Normal affect  Labs    Chemistry Recent Labs  Lab 01/30/18 1536  02/01/18 0249 02/02/18 0535 02/03/18 0224  NA 142   < > 140 140 140  K 4.2   < > 4.2 3.7 3.7  CL 110   < > 104 107 107  CO2 22   < > 25 25 24   GLUCOSE 89   < > 89 93 97  BUN 24*   < > 25* 20 16  CREATININE 1.29*   < > 1.35* 1.29* 1.29*  CALCIUM 9.1   < > 9.5 8.8* 8.7*    PROT 6.5  --   --   --   --   ALBUMIN 3.7  --   --   --   --   AST 23  --   --   --   --   ALT 16  --   --   --   --   ALKPHOS 46  --   --   --   --   BILITOT 0.8  --   --   --   --   GFRNONAA 50*   < > 48* 50* 50*  GFRAA 58*   < > 55* 58* 58*  ANIONGAP 10   < > 11 8 9    < > = values in this interval not displayed.     Hematology Recent Labs  Lab 01/30/18 1536 01/30/18 1552 02/03/18 0224  WBC 4.9  --  5.6  RBC 4.15*  --  4.05*  HGB 12.3*  13.3 12.0*  HCT 40.4 39.0 36.9*  MCV 97.3  --  91.1  MCH 29.6  --  29.6  MCHC 30.4  --  32.5  RDW 13.9  --  13.9  PLT 121*  --  143*    Cardiac EnzymesNo results for input(s): TROPONINI in the last 168 hours.  Recent Labs  Lab 01/30/18 1550  TROPIPOC 0.01     BNPNo results for input(s): BNP, PROBNP in the last 168 hours.   DDimer No results for input(s): DDIMER in the last 168 hours.   Radiology    No results found.  Cardiac Studies   ECHO  Study Conclusions  - Left ventricle: The cavity size was normal. Wall thickness was   increased in a pattern of moderate LVH. The estimated ejection   fraction was 50%. Diffuse hypokinesis. The study was not   technically sufficient to allow evaluation of LV diastolic   dysfunction due to atrial fibrillation. - Aortic valve: There was no stenosis. There was mild   regurgitation. - Aorta: Dilated ascending aorta. Ascending aortic diameter: 44 mm   (S). - Mitral valve: There was mild regurgitation. - Left atrium: The atrium was severely dilated. - Right ventricle: The cavity size was normal. Systolic function   was mildly reduced. - Right atrium: The atrium was severely dilated. - Tricuspid valve: Peak RV-RA gradient (S): 46 mm Hg. - Pulmonary arteries: PA peak pressure: 61 mm Hg (S). - Systemic veins: IVC measured 2.4 cm with < 50% respirophasic   variation, suggesting RA pressure 15 mmHg. - Pericardium, extracardiac: A trivial pericardial effusion was    identified.   Patient Profile     85 y.o. male with advanced dementia[facility], history of atrial fibrillation permanent with tachycardia-bradycardia syndrome with occasional heart rates in the 40s. About 3 years ago was noted to have pauses with his atrial fibrillation and pacemaker was recommended but declined.   Assessment & Plan    CHRONIC ATRIAL FIB WITH TACHY BRADY:   Eliquis on hold for procedure.  On heparin for now.  On beta blocker.  He is tolerating beta blocker.   HTN:  Possibly related to RAS.  Plan angiogram tomorrow.   Currently BP marginally OK on meds as listed.  Will await images tomorrow.    For questions or updates, please contact [facility] Please consult [email or web address] for contact info under Cardiology/STEMI.   Signed, Rollene Rotunda, MD  02/03/2018, 11:13 AM

## 2018-02-03 NOTE — Progress Notes (Signed)
[facility]                                                                              Patient Demographics  Tyler Carr, is a 85 y.o. male, DOB - [birth year], ZOX:[id number]  Admit date - 01/30/2018   Admitting Physician Inez Catalina, MD  Outpatient Primary MD for the patient is Patient, No Pcp Per  Outpatient specialists:   LOS - 4  days   Medical records reviewed and are as summarized below:    chief complaint : Slurred speech      Brief summary   Patient is 85 year old male with CAD, CHF, possibly Afib, dementia who presentedfor slurred speech, [facility]  History per daughter, patient had gone to [place] PCP a day before the mission and was started on clonidine for elevated blood pressure in 200s.  Patient took the clonidine the first time overnight and when he woke up on the morning of admission, had swelling of the lips and slurred speech.  In ED, he was bradycardiac with heart rate in 30s. He was assessed by neurology, CT head only showed chronic changes, symptoms are unlikely due to acute stroke.  Assessment & Plan    Principal Problem: Symptomatic bradycardia -Currently no slurred speech, heart rate in 70s and 80s -Likely due to combination of clonidine, Coreg, Norvasc.  He also reported nausea, no vomiting, possibly underlying vagal effect as well -No further bradycardia, restarted Coreg.  Active Problems: Accelerated hypertension, hypertension uncontrolled --2D echo showed EF of 50% with moderate concentric LVH, mild LV systolic dysfunction -Renal artery duplex showed > 60% stenosis on the right with elevated resistance index, left 1-59%stenosis. -Renal angiogram in a.m., continue heparin drip, Eliquis on hold, n.p.o. after midnight -BP currently stable  Hyperkalemia with mild acute renal insufficiency, unclear if patient has CKD -Baseline creatinine unknown, creatinine currently stable at 1.2 -Potassium stable 3.7 -Continue to hold  Aldactone  Hypothyroidism -TSH 7.3, free T4 and T3 normal  History of CHF (congestive heart failure) (HCC), unknown if systolic or diastolic -2D echo showed EF of 50% with moderate concentric LVH, mild LV systolic dysfunction    Hyperlipidemia Continue simvastatin  Slurred speech -Resolved likely due to accelerated hypertension, bradycardia -Seen by neurology, no further work-up  Dementia -Currently stable, hold melatonin  Permanent atrial fibrillation tachybradycardia syndrome -Continue Coreg, no episodes of syncope while inpatient -Cardiology following, no indication for pacemaker at this time -Continue Eliquis   Code Status: Full code DVT Prophylaxis: Apixaban Family Communication: Discussed in detail with the patient, all imaging results, lab results explained to the patient and wife in the room   Disposition Plan: Plan for renal angiogram on Tuesday  Time Spent in minutes   25 minutes  Procedures:  2D echo Renal arterial duplex  Consultants:   Cardiology Neurology Vascular surgery  Antimicrobials:   Anti-infectives (From admission, onward)   None         Medications  Scheduled Meds: . amLODipine  5 mg Oral Daily  . carvedilol  12.5 mg Oral BID  . furosemide  40 mg Oral Daily  . hydrALAZINE  50 mg Oral Q8H  .  lisinopril  40 mg Oral Daily  . simvastatin  20 mg Oral QHS  . sodium chloride flush  3 mL Intravenous Q12H  . tamsulosin  0.4 mg Oral Daily   Continuous Infusions: . heparin 850 Units/hr (02/03/18 0800)   PRN Meds:.acetaminophen **OR** acetaminophen, hydrALAZINE      Subjective:   Tyler Carr was seen and examined today.  BP stable, no acute complaints, wife at the bedside.  No acute issues. Patient denies dizziness, chest pain, shortness of breath, abdominal pain, N/V/D/C, new weakness, numbess, tingling.   Objective:   Vitals:   02/03/18 0600 02/03/18 0800 02/03/18 0945 02/03/18 1200  BP:   (!) 127/57   Pulse:   71     Resp: 13     Temp:   98.8 F (37.1 C)   TempSrc:   Oral   SpO2:  98% 95% 95%  Weight: 64.9 kg     Height:        Intake/Output Summary (Last 24 hours) at 02/03/2018 1236 Last data filed at 02/03/2018 1200 Gross per 24 hour  Intake 414.86 ml  Output -  Net 414.86 ml     Wt Readings from Last 3 Encounters:  02/03/18 64.9 kg    Physical Exam General: Alert and oriented x 3, NAD Eyes:  HEENT: Cardiovascular: S1 S2 clear, no murmurs, RRR. No pedal edema b/l Respiratory: CTAB, no wheezing, rales or rhonchi Gastrointestinal: Soft, nontender, nondistended, NBS Ext: no pedal edema bilaterally Neuro: no new deficits Musculoskeletal: No cyanosis, clubbing Skin: No rashes Psych: Normal affect and demeanor, alert and oriented x3    Data Reviewed:  I have personally reviewed following labs and imaging studies  Micro Results Recent Results (from the past 240 hour(s))  MRSA PCR Screening     Status: None   Collection Time: 01/30/18  8:50 PM  Result Value Ref Range Status   MRSA by PCR NEGATIVE NEGATIVE Final    Comment:        The GeneXpert MRSA Assay (FDA approved for NASAL specimens only), is one component of a comprehensive MRSA colonization surveillance program. It is not intended to diagnose MRSA infection nor to guide or monitor treatment for MRSA infections. Performed at [facility], [address] [place], [place] [address]     Radiology Reports Ct Head Code Stroke Wo Contrast  Result Date: 01/30/2018 CLINICAL DATA:  Code stroke.  Slurred speech. EXAM: CT HEAD WITHOUT CONTRAST TECHNIQUE: Contiguous axial images were obtained from the base of the skull through the vertex without intravenous contrast. COMPARISON:  None. FINDINGS: Brain: There is no evidence of acute infarct, intracranial hemorrhage, mass, midline shift, or extra-axial fluid collection. Generalized cerebral atrophy is relatively mild for age. There is a small chronic left cerebellar infarct.  Patchy hypodensities in the cerebral white matter bilaterally are nonspecific but compatible with moderate chronic small vessel ischemic disease. Vascular: Calcified atherosclerosis at the skull base. No hyperdense vessel. Skull: No fracture or focal osseous lesion. Sinuses/Orbits: Small mucous retention cysts in the maxillary sinuses. Clear mastoid air cells. Unremarkable orbits. 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 ASPECTS [facility]([place] Stroke Program Early CT Score) - Ganglionic level infarction (caudate, lentiform nuclei, internal capsule, insula, M1-M3 cortex): 7 - Supraganglionic infarction (M4-M6 cortex): 3 Total score (0-10 with 10 being normal): 10 IMPRESSION: 1. No evidence of acute intracranial abnormality. 2. ASPECTS is 10. 3. Moderate chronic small vessel ischemic disease. 4. Chronic left cerebellar infarct. These results were called by telephone at the  time of interpretation on 01/30/2018 at 3:52 pm to Dr. Scherrie November, who verbally acknowledged these results. Electronically Signed   By: Sebastian Ache M.D.   On: 01/30/2018 15:53   Vas US Renal Artery Duplex  Result Date: 02/02/2018 ABDOMINAL VISCERAL Indications: HTN Accelerated High Risk Factors: Hypertension. Limitations: Air/bowel gas, patient discomfort and cannot hold breath and short of breath. Comparison Study: No comparison study available Performing Technologist: Melodie Bouillon  Examination Guidelines: A complete evaluation includes B-mode imaging, spectral Doppler, color Doppler, and power Doppler as needed of all accessible portions of each vessel. Bilateral testing is considered an integral part of a complete examination. Limited examinations for reoccurring indications may be performed as noted.  Duplex Findings: +--------------------+--------+--------+------+--------+ Mesenteric          PSV cm/sEDV cm/sPlaqueComments +--------------------+--------+--------+------+--------+ Aorta Mid              64      17                   +--------------------+--------+--------+------+--------+ Celiac Artery Origin  187      20                  +--------------------+--------+--------+------+--------+ SMA Origin            392                          +--------------------+--------+--------+------+--------+  +------------------+--------+--------+-------+ Right Renal ArteryPSV cm/sEDV cm/sComment +------------------+--------+--------+-------+ Origin              225      55           +------------------+--------+--------+-------+ Proximal             76      19           +------------------+--------+--------+-------+ Mid                  95      27           +------------------+--------+--------+-------+ Distal               93      27           +------------------+--------+--------+-------+ +-----------------+--------+--------+-------+ Left Renal ArteryPSV cm/sEDV cm/sComment +-----------------+--------+--------+-------+ Origin             125      30           +-----------------+--------+--------+-------+ Proximal           130      43           +-----------------+--------+--------+-------+ Mid                 29      7            +-----------------+--------+--------+-------+ Distal              36      7            +-----------------+--------+--------+-------+ +------------+--------+--------+----+-----------+--------+--------+----+ Right KidneyPSV cm/sEDV cm/sRI  Left KidneyPSV cm/sEDV cm/sRI   +------------+--------+--------+----+-----------+--------+--------+----+ Upper Pole  25      6       0.78Upper Pole 23      9       0.48 +------------+--------+--------+----+-----------+--------+--------+----+ Mid         31      6       0.        23  7       0.68 +------------+--------+--------+----+-----------+--------+--------+----+ Lower Pole  21      6       0.72Lower Pole 25      10      0.61  +------------+--------+--------+----+-----------+--------+--------+----+ Hilar       25      7       0.70Hilar      25      8       0.68 +------------+--------+--------+----+-----------+--------+--------+----+ +------------------+-----+------------------+----+ Right Kidney           Left Kidney            +------------------+-----+------------------+----+ RAR                    RAR                    +------------------+-----+------------------+----+ RAR (manual)      3.52 RAR (manual)      2.03 +------------------+-----+------------------+----+ Cortex                 Cortex                 +------------------+-----+------------------+----+ Cortex thickness       Corex thickness        +------------------+-----+------------------+----+ Kidney length (cm)10.80Kidney length (cm)9.90 +------------------+-----+------------------+----+  Summary: Renal:  Right: Evidence of a greater than 60% stenosis of the right renal        artery. RRV flow present. Abnormal right Resistive Index.        Normal size right kidney. Left:  1-59% stenosis of the left renal artery. LRV flow present.        Normal left Resistive Index. Normal size of left kidney.        Cyst(s) noted.        Incidental finding: Multiple cystic structure seen on the        border of left kidney, further evaluation needed through        other modality if clinical concern. Mesenteric: Normal Celiac artery findings. 70 to 99% stenosis in the superior mesenteric artery.  *See table(s) above for measurements and observations.  Diagnosing physician: Coral Else MD  Electronically signed by Coral Else MD on 02/02/2018 at 5:02:57 PM.    Final     Lab Data:  CBC: Recent Labs  Lab 01/30/18 1536 01/30/18 1552 02/03/18 0224  WBC 4.9  --  5.6  NEUTROABS 3.0  --   --   HGB 12.3* 13.3 12.0*  HCT 40.4 39.0 36.9*  MCV 97.3  --  91.1  PLT 121*  --  143*   Basic Metabolic Panel: Recent Labs  Lab 01/30/18 1536  01/30/18 1552 01/31/18 0643 02/01/18 0249 02/02/18 0535 02/03/18 0224  NA 142 140 140 140 140 140  K 4.2 4.0 5.7* 4.2 3.7 3.7  CL 110 108 107 104 107 107  CO2 22  --  24 25 25 24   GLUCOSE 89 84 109* 89 93 97  BUN 24* 25* 22 25* 20 16  CREATININE 1.29* 1.20 1.31* 1.35* 1.29* 1.29*  CALCIUM 9.1  --  9.2 9.5 8.8* 8.7*   GFR: Estimated Creatinine Clearance: 38.4 mL/min (A) (by C-G formula based on SCr of 1.29 mg/dL (H)). Liver Function Tests: Recent Labs  Lab 01/30/18 1536  AST 23  ALT 16  ALKPHOS 46  BILITOT 0.8  PROT 6.5  ALBUMIN 3.7   No results for input(s): LIPASE, AMYLASE in the last 168 hours. No  results for input(s): AMMONIA in the last 168 hours. Coagulation Profile: Recent Labs  Lab 01/30/18 1536  INR 1.34   Cardiac Enzymes: No results for input(s): CKTOTAL, CKMB, CKMBINDEX, TROPONINI in the last 168 hours. BNP (last 3 results) No results for input(s): PROBNP in the last 8760 hours. HbA1C: No results for input(s): HGBA1C in the last 72 hours. CBG: Recent Labs  Lab 01/30/18 1539 01/30/18 1840 01/31/18 1149  GLUCAP 77 81 66*   Lipid Profile: No results for input(s): CHOL, HDL, LDLCALC, TRIG, CHOLHDL, LDLDIRECT in the last 72 hours. Thyroid Function Tests: No results for input(s): TSH, T4TOTAL, FREET4, T3FREE, THYROIDAB in the last 72 hours. Anemia Panel: No results for input(s): VITAMINB12, FOLATE, FERRITIN, TIBC, IRON, RETICCTPCT in the last 72 hours. Urine analysis:    Component Value Date/Time   COLORURINE YELLOW 01/30/2018 1746   APPEARANCEUR CLEAR 01/30/2018 1746   LABSPEC 1.016 01/30/2018 1746   PHURINE 6.0 01/30/2018 1746   GLUCOSEU NEGATIVE 01/30/2018 1746   HGBUR NEGATIVE 01/30/2018 1746   BILIRUBINUR NEGATIVE 01/30/2018 1746   KETONESUR NEGATIVE 01/30/2018 1746   PROTEINUR NEGATIVE 01/30/2018 1746   NITRITE NEGATIVE 01/30/2018 1746   LEUKOCYTESUR NEGATIVE 01/30/2018 1746     Cash Meadow M.D. [facility] 02/03/2018, 12:36  PM  Pager: [phone] Between 7am to 7pm - call Pager - [phone]  After 7pm go to [email or web address] - password [email or web address]  Call night coverage person covering after 7pm

## 2018-02-03 NOTE — Progress Notes (Signed)
ANTICOAGULATION CONSULT NOTE  Pharmacy Consult for heparin Indication: atrial fibrillation  Not on File  Patient Measurements: Height: 5\' 10"  (177.8 cm) Weight: 143 lb 1.3 oz (64.9 kg) IBW/kg (Calculated) : 73 Heparin Dosing Weight: 70kg  Vital Signs: Temp: 98.5 F (36.9 C) (01/20 0312) Temp Source: Oral (01/20 0312) BP: 127/57 (01/20 0945) Pulse Rate: 65 (01/20 0312)  Labs: Recent Labs    02/01/18 0249  02/02/18 0535 02/02/18 1140 02/02/18 2229 02/03/18 0224  HGB  --   --   --   --   --  12.0*  HCT  --   --   --   --   --  36.9*  PLT  --   --   --   --   --  143*  APTT  --    < > 94* 120* 87* 77*  HEPARINUNFRC  --   --  1.56*  --   --  0.86*  CREATININE 1.35*  --  1.29*  --   --  1.29*   < > = values in this interval not displayed.    Estimated Creatinine Clearance: 38.4 mL/min (A) (by C-G formula based on SCr of 1.29 mg/dL (H)).   Assessment: 85 y.o. male with h/o Afib, Eliquis on hold while awaiting angiogram, for heparin. IV heparin currently running at 850 units/hr. APTT remains therapeutic this AM. CBC reviewed. No s/s of bleeding per RN   Goal of Therapy:  Heparin level 0.3-0.7 units/ml aPTT 66-102 seconds Monitor platelets by anticoagulation protocol: Yes   Plan:  Continue Heparin at current rate  Monitor daily labs and for s/s of bleeding   Vinnie Level, PharmD., BCPS Clinical Pharmacist Clinical phone for 02/03/18 until 3:30pm: [phone] If after 3:30pm, please refer to [facility] for unit-specific pharmacist

## 2018-02-04 ENCOUNTER — Ambulatory Visit (HOSPITAL_COMMUNITY): Admit: 2018-02-04 | Payer: MEDICAID | Admitting: Surgery

## 2018-02-04 ENCOUNTER — Encounter (HOSPITAL_COMMUNITY)
Admission: EM | Disposition: A | Discharge status: Home Health | Payer: Self-pay | Source: Home / Self Care | Attending: Internal Medicine

## 2018-02-04 ENCOUNTER — Encounter (HOSPITAL_COMMUNITY): Payer: Self-pay | Admitting: Surgery

## 2018-02-04 HISTORY — PX: PERIPHERAL VASCULAR INTERVENTION: CATH118257

## 2018-02-04 HISTORY — PX: ABDOMINAL AORTOGRAM: CATH118222

## 2018-02-04 LAB — BASIC METABOLIC PANEL
Anion gap: 8 (ref 5–15)
BUN: 21 mg/dL (ref 8–23)
CO2: 23 mmol/L (ref 22–32)
Calcium: 9 mg/dL (ref 8.9–10.3)
Chloride: 107 mmol/L (ref 98–111)
Creatinine, Ser: 1.25 mg/dL — ABNORMAL HIGH (ref 0.61–1.24)
GFR calc Af Amer: >60 mL/min (ref >60)
GFR calc non Af Amer: 52 mL/min — ABNORMAL LOW (ref >60)
Glucose, Bld: 93 mg/dL (ref 70–99)
Potassium: 3.6 mmol/L (ref 3.5–5.1)
Sodium: 138 mmol/L (ref 135–145)

## 2018-02-04 LAB — CBC
HCT: 35.6 % — ABNORMAL LOW (ref 39.0–52.0)
Hemoglobin: 11.9 g/dL — ABNORMAL LOW (ref 13.0–17.0)
MCH: 30.2 pg (ref 26.0–34.0)
MCHC: 33.4 g/dL (ref 30.0–36.0)
MCV: 90.4 fL (ref 80.0–100.0)
Platelets: 143 10*3/uL — ABNORMAL LOW (ref 150–400)
RBC: 3.94 MIL/uL — ABNORMAL LOW (ref 4.22–5.81)
RDW: 13.9 % (ref 11.5–15.5)
WBC: 4.5 10*3/uL (ref 4.0–10.5)
nRBC: 0 % (ref 0.0–0.2)

## 2018-02-04 LAB — HEPARIN LEVEL (UNFRACTIONATED): Heparin Unfractionated: 0.64 IU/mL (ref 0.30–0.70)

## 2018-02-04 LAB — APTT: aPTT: 84 seconds — ABNORMAL HIGH (ref 24–36)

## 2018-02-04 SURGERY — ABDOMINAL AORTOGRAM
Anesthesia: LOCAL

## 2018-02-04 MED ORDER — HEPARIN SODIUM (PORCINE) 1000 UNIT/ML IJ SOLN
INTRAMUSCULAR | Status: AC
  Filled 2018-02-04: qty 1

## 2018-02-04 MED ORDER — HYDRALAZINE HCL 20 MG/ML IJ SOLN: 5.0000 mg | INTRAMUSCULAR | Status: DC | PRN

## 2018-02-04 MED ORDER — MIDAZOLAM HCL 2 MG/2ML IJ SOLN
INTRAMUSCULAR | Status: AC
  Filled 2018-02-04: qty 2

## 2018-02-04 MED ORDER — ONDANSETRON HCL 4 MG/2ML IJ SOLN: 4.0000 mg | Freq: Four times a day (QID) | INTRAMUSCULAR | Status: DC | PRN

## 2018-02-04 MED ORDER — FENTANYL CITRATE (PF) 100 MCG/2ML IJ SOLN
INTRAMUSCULAR | Status: AC
  Filled 2018-02-04: qty 2

## 2018-02-04 MED ORDER — HEPARIN (PORCINE) IN NACL 1000-0.9 UT/500ML-% IV SOLN
INTRAVENOUS | Status: DC | PRN
  Administered 2018-02-04 (×2): 500 mL

## 2018-02-04 MED ORDER — SODIUM CHLORIDE 0.9% FLUSH: 3.0000 mL | INTRAVENOUS | Status: DC | PRN

## 2018-02-04 MED ORDER — ASPIRIN EC 81 MG PO TBEC
81.0000 mg | DELAYED_RELEASE_TABLET | Freq: Every day | ORAL | Status: DC
  Administered 2018-02-04 – 2018-02-05 (×2): 81 mg via ORAL
  Filled 2018-02-04 (×2): qty 1

## 2018-02-04 MED ORDER — LABETALOL HCL 5 MG/ML IV SOLN: 10.0000 mg | INTRAVENOUS | Status: DC | PRN

## 2018-02-04 MED ORDER — LIDOCAINE HCL (PF) 1 % IJ SOLN
INTRAMUSCULAR | Status: DC | PRN
  Administered 2018-02-04: 17 mL via INTRADERMAL

## 2018-02-04 MED ORDER — LIDOCAINE HCL (PF) 1 % IJ SOLN
INTRAMUSCULAR | Status: AC
  Filled 2018-02-04: qty 30

## 2018-02-04 MED ORDER — IODIXANOL 320 MG/ML IV SOLN
INTRAVENOUS | Status: DC | PRN
  Administered 2018-02-04: 100 mL via INTRA_ARTERIAL

## 2018-02-04 MED ORDER — ACETAMINOPHEN 325 MG PO TABS: 650.0000 mg | ORAL_TABLET | ORAL | Status: DC | PRN

## 2018-02-04 MED ORDER — MIDAZOLAM HCL 2 MG/2ML IJ SOLN
INTRAMUSCULAR | Status: DC | PRN
  Administered 2018-02-04: 1 mg via INTRAVENOUS

## 2018-02-04 MED ORDER — FENTANYL CITRATE (PF) 100 MCG/2ML IJ SOLN
INTRAMUSCULAR | Status: DC | PRN
  Administered 2018-02-04: 25 ug via INTRAVENOUS

## 2018-02-04 MED ORDER — HEPARIN (PORCINE) IN NACL 1000-0.9 UT/500ML-% IV SOLN
INTRAVENOUS | Status: AC
  Filled 2018-02-04: qty 1000

## 2018-02-04 MED ORDER — SODIUM CHLORIDE 0.9 % IV SOLN: 250.0000 mL | INTRAVENOUS | Status: DC | PRN

## 2018-02-04 MED ORDER — SODIUM CHLORIDE 0.9 % WEIGHT BASED INFUSION
1.0000 mL/kg/h | INTRAVENOUS | Status: AC
  Administered 2018-02-04: 1 mL/kg/h via INTRAVENOUS

## 2018-02-04 MED ORDER — HEPARIN SODIUM (PORCINE) 1000 UNIT/ML IJ SOLN
INTRAMUSCULAR | Status: DC | PRN
  Administered 2018-02-04: 7000 [IU] via INTRAVENOUS

## 2018-02-04 MED ORDER — SODIUM CHLORIDE 0.9% FLUSH
3.0000 mL | Freq: Two times a day (BID) | INTRAVENOUS | Status: DC
  Administered 2018-02-04 – 2018-02-05 (×2): 3 mL via INTRAVENOUS

## 2018-02-04 MED ORDER — APIXABAN 5 MG PO TABS
5.0000 mg | ORAL_TABLET | Freq: Two times a day (BID) | ORAL | Status: DC
  Administered 2018-02-04 – 2018-02-05 (×2): 5 mg via ORAL
  Filled 2018-02-04 (×2): qty 1

## 2018-02-04 MED ORDER — SODIUM CHLORIDE 0.9 % IV SOLN
INTRAVENOUS | Status: DC
  Administered 2018-02-04: 08:00:00 via INTRAVENOUS

## 2018-02-04 SURGICAL SUPPLY — 16 items
CATH OMNI FLUSH 5F 65CM (CATHETERS) ×3 IMPLANT
CLOSURE MYNX CONTROL 6F/7F (Vascular Products) ×3 IMPLANT
GUIDE CATH VISTA IMA 6F (CATHETERS) ×3 IMPLANT
KIT ENCORE 26 ADVANTAGE (KITS) ×3 IMPLANT
KIT MICROPUNCTURE NIT STIFF (SHEATH) ×3 IMPLANT
KIT PV (KITS) ×3 IMPLANT
SHEATH PINNACLE 6F 10CM (SHEATH) ×3 IMPLANT
SHEATH PROBE COVER 6X72 (BAG) ×3 IMPLANT
STENT HERCULINK RX 6.0X12X135 (Permanent Stent) ×3 IMPLANT
STOPCOCK MORSE 400PSI 3WAY (MISCELLANEOUS) ×3 IMPLANT
SYR MEDRAD MARK V 150ML (SYRINGE) ×3 IMPLANT
TRANSDUCER W/STOPCOCK (MISCELLANEOUS) ×3 IMPLANT
TRAY PV CATH (CUSTOM PROCEDURE TRAY) ×3 IMPLANT
TUBING CIL FLEX 10 FLL-RA (TUBING) ×3 IMPLANT
WIRE BENTSON .035X145CM (WIRE) ×3 IMPLANT
WIRE SPARTACORE .014X190CM (WIRE) ×3 IMPLANT

## 2018-02-04 NOTE — Progress Notes (Signed)
[facility]                                                                              Patient Demographics  Tyler Carr, is a 85 y.o. male, DOB - [birth year], ZOX:[id number]  Admit date - 01/30/2018   Admitting Physician Inez CatalinaEmily B Mullen, MD  Outpatient Primary MD for the patient is Patient, No Pcp Per  Outpatient specialists:   LOS - 5  days   Medical records reviewed and are as summarized below:    chief complaint : Slurred speech      Brief summary   Patient is 85 year old male with CAD, CHF, possibly Afib, dementia who presentedfor slurred speech, [facility]  History per [place], [place] [place] [facility] and was started on clonidine for elevated blood pressure in 200s.  Patient took the clonidine the first time overnight and when he woke up on the morning of admission, had swelling of the lips and slurred speech.  In ED, he was bradycardiac with heart rate in 30s. He was assessed by neurology, CT head only showed chronic changes, symptoms are unlikely due to acute stroke.  Assessment & Plan    Principal Problem: Symptomatic bradycardia -Currently no slurred speech, heart rate in 70s and 80s -Likely due to combination of clonidine, Coreg, Norvasc.  He also reported nausea, no vomiting, possibly underlying vagal effect as well -No further bradycardia, restarted Coreg.  Active Problems: Accelerated hypertension, hypertension uncontrolled --2D echo showed EF of 50% with moderate concentric LVH, mild LV systolic dysfunction -Renal artery duplex showed > 60% stenosis on the right with elevated resistance index, left 1-59%stenosis. -N.p.o., plan for renal angiogram today, heparin drip on hold  -Will restart Eliquis once cleared by vascular surgery after the angiogram  -BP currently stable   Hyperkalemia with mild acute renal insufficiency, unclear if patient has CKD -Baseline creatinine unknown -Potassium 5.7 at the time  of admission, Aldactone on hold -Creatinine stable 1.2, potassium 3.6  Hypothyroidism -TSH 7.3, free T4 and T3 normal  History of CHF (congestive heart failure) (HCC), unknown if systolic or diastolic -2D echo showed EF of 50% with moderate concentric LVH, mild LV systolic dysfunction    Hyperlipidemia Continue simvastatin  Slurred speech -Resolved likely due to accelerated hypertension, bradycardia -Seen by neurology, no further work-up  Dementia -Currently stable, hold melatonin  Permanent atrial fibrillation tachybradycardia syndrome -Continue Coreg, no episodes of syncope while inpatient -Cardiology following, no indication for pacemaker at this time -Restart Eliquis after renal angiogram once cleared by vascular surgery   Code Status: Full code DVT Prophylaxis: Apixaban on hold, on heparin drip Family Communication: Discussed in detail with the patient, all imaging results, lab results explained to the patient and wife in the room   Disposition Plan: Renal angiogram today  Time Spent in minutes   25 minutes  Procedures:  2D echo Renal arterial duplex  Consultants:   Cardiology Neurology Vascular surgery  Antimicrobials:   Anti-infectives (From admission, onward)   None         Medications  Scheduled Meds: . [MAR Hold] amLODipine  5 mg  Oral Daily  . [MAR Hold] carvedilol  12.5 mg Oral BID  . [MAR Hold] furosemide  40 mg Oral Daily  . [MAR Hold] hydrALAZINE  50 mg Oral Q8H  . [MAR Hold] lisinopril  40 mg Oral Daily  . [MAR Hold] simvastatin  20 mg Oral QHS  . [MAR Hold] sodium chloride flush  3 mL Intravenous Q12H  . [MAR Hold] tamsulosin  0.4 mg Oral Daily   Continuous Infusions: . sodium chloride 100 mL/hr at 02/04/18 0747  . heparin Stopped (02/04/18 0902)   PRN Meds:.[MAR Hold] acetaminophen **OR** [MAR Hold] acetaminophen, [MAR Hold] hydrALAZINE      Subjective:   Tyler GuardianMarvin Badeaux was seen and examined today am.  No complaints this  morning, no chest pain or shortness of breath or dizziness.  BP stable.  Wife at the bedside.  Patient denies dizziness, chest pain, shortness of breath, abdominal pain, N/V/D/C, new weakness, numbess, tingling.   Objective:   Vitals:   02/04/18 0300 02/04/18 0440 02/04/18 0546 02/04/18 0744  BP:  (!) 134/58 (!) 146/79   Pulse:  65  83  Resp: (!) 24 18    Temp:  98.1 F (36.7 C)  97.7 F (36.5 C)  TempSrc:  Oral  Oral  SpO2:  96%  100%  Weight:  65.8 kg    Height:        Intake/Output Summary (Last 24 hours) at 02/04/2018 0947 Last data filed at 02/04/2018 0300 Gross per 24 hour  Intake 563.01 ml  Output -  Net 563.01 ml     Wt Readings from Last 3 Encounters:  02/04/18 65.8 kg    Physical Exam  General: Alert and oriented x 3, NAD  Eyes:   HEENT:    Cardiovascular: S1 S2 clear, RRR. No pedal edema b/l  Respiratory: CTAB, no wheezing, rales or rhonchi  Gastrointestinal: Soft, nontender, nondistended, NBS  Ext: no pedal edema bilaterally  Neuro: no new deficits  Musculoskeletal: No cyanosis, clubbing  Skin: No rashes  Psych: Normal affect and demeanor, alert and oriented x3    Data Reviewed:  I have personally reviewed following labs and imaging studies  Micro Results Recent Results (from the past 240 hour(s))  MRSA PCR Screening     Status: None   Collection Time: 01/30/18  8:50 PM  Result Value Ref Range Status   MRSA by PCR NEGATIVE NEGATIVE Final    Comment:        The GeneXpert MRSA Assay (FDA approved for NASAL specimens only), is one component of a comprehensive MRSA colonization surveillance program. It is not intended to diagnose MRSA infection nor to guide or monitor treatment for MRSA infections. Performed at [facility], [address] [place], [place] [address]     Radiology Reports Ct Head Code Stroke Wo Contrast  Result Date: 01/30/2018 CLINICAL DATA:  Code stroke.  Slurred speech. EXAM: CT HEAD WITHOUT CONTRAST  TECHNIQUE: Contiguous axial images were obtained from the base of the skull through the vertex without intravenous contrast. COMPARISON:  None. FINDINGS: Brain: There is no evidence of acute infarct, intracranial hemorrhage, mass, midline shift, or extra-axial fluid collection. Generalized cerebral atrophy is relatively mild for age. There is a small chronic left cerebellar infarct. Patchy hypodensities in the cerebral white matter bilaterally are nonspecific but compatible with moderate chronic small vessel ischemic disease. Vascular: Calcified atherosclerosis at the skull base. No hyperdense vessel. Skull: No fracture or focal osseous lesion. Sinuses/Orbits: Small mucous retention cysts in the  maxillary sinuses. Clear mastoid air cells. Unremarkable orbits. 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 ASPECTS [facility] Stroke Program Early CT Score) - Ganglionic level infarction (caudate, lentiform nuclei, internal capsule, insula, M1-M3 cortex): 7 - Supraganglionic infarction (M4-M6 cortex): 3 Total score (0-10 with 10 being normal): 10 IMPRESSION: 1. No evidence of acute intracranial abnormality. 2. ASPECTS is 10. 3. Moderate chronic small vessel ischemic disease. 4. Chronic left cerebellar infarct. These results were called by telephone at the time of interpretation on 01/30/2018 at 3:52 pm to Dr. Scherrie November, who verbally acknowledged these results. Electronically Signed   By: Sebastian Ache M.D.   On: 01/30/2018 15:53   Vas US Renal Artery Duplex  Result Date: 02/02/2018 ABDOMINAL VISCERAL Indications: HTN Accelerated High Risk Factors: Hypertension. Limitations: Air/bowel gas, patient discomfort and cannot hold breath and short of breath. Comparison Study: No comparison study available Performing Technologist: Melodie Bouillon  Examination Guidelines: A complete evaluation includes B-mode imaging, spectral Doppler, color Doppler, and power Doppler as needed of all accessible portions of each vessel. Bilateral testing is  considered an integral part of a complete examination. Limited examinations for reoccurring indications may be performed as noted.  Duplex Findings: +--------------------+--------+--------+------+--------+ Mesenteric          PSV cm/sEDV cm/sPlaqueComments +--------------------+--------+--------+------+--------+ Aorta Mid              64      17                  +--------------------+--------+--------+------+--------+ Celiac Artery Origin  187      20                  +--------------------+--------+--------+------+--------+ SMA Origin            392                          +--------------------+--------+--------+------+--------+  +------------------+--------+--------+-------+ Right Renal ArteryPSV cm/sEDV cm/sComment +------------------+--------+--------+-------+ Origin              225      55           +------------------+--------+--------+-------+ Proximal             76      19           +------------------+--------+--------+-------+ Mid                  95      27           +------------------+--------+--------+-------+ Distal               93      27           +------------------+--------+--------+-------+ +-----------------+--------+--------+-------+ Left Renal ArteryPSV cm/sEDV cm/sComment +-----------------+--------+--------+-------+ Origin             125      30           +-----------------+--------+--------+-------+ Proximal           130      43           +-----------------+--------+--------+-------+ Mid                 29      7            +-----------------+--------+--------+-------+ Distal              36      7            +-----------------+--------+--------+-------+ +------------+--------+--------+----+-----------+--------+--------+----+  Right KidneyPSV cm/sEDV cm/sRI  Left KidneyPSV cm/sEDV cm/sRI   +------------+--------+--------+----+-----------+--------+--------+----+ Upper Pole  25      6       0.78Upper  Pole 23      9       0.48 +------------+--------+--------+----+-----------+--------+--------+----+ Mid         31      6       0.        23      7       0.68 +------------+--------+--------+----+-----------+--------+--------+----+ Lower Pole  21      6       0.72Lower Pole 25      10      0.61 +------------+--------+--------+----+-----------+--------+--------+----+ Hilar       25      7       0.70Hilar      25      8       0.68 +------------+--------+--------+----+-----------+--------+--------+----+ +------------------+-----+------------------+----+ Right Kidney           Left Kidney            +------------------+-----+------------------+----+ RAR                    RAR                    +------------------+-----+------------------+----+ RAR (manual)      3.52 RAR (manual)      2.03 +------------------+-----+------------------+----+ Cortex                 Cortex                 +------------------+-----+------------------+----+ Cortex thickness       Corex thickness        +------------------+-----+------------------+----+ Kidney length (cm)10.80Kidney length (cm)9.90 +------------------+-----+------------------+----+  Summary: Renal:  Right: Evidence of a greater than 60% stenosis of the right renal        artery. RRV flow present. Abnormal right Resistive Index.        Normal size right kidney. Left:  1-59% stenosis of the left renal artery. LRV flow present.        Normal left Resistive Index. Normal size of left kidney.        Cyst(s) noted.        Incidental finding: Multiple cystic structure seen on the        border of left kidney, further evaluation needed through        other modality if clinical concern. Mesenteric: Normal Celiac artery findings. 70 to 99% stenosis in the superior mesenteric artery.  *See table(s) above for measurements and observations.  Diagnosing physician: Coral Else MD  Electronically signed by Coral Else MD on  02/02/2018 at 5:02:57 PM.    Final     Lab Data:  CBC: Recent Labs  Lab 01/30/18 1536 01/30/18 1552 02/03/18 0224 02/04/18 0249  WBC 4.9  --  5.6 4.5  NEUTROABS 3.0  --   --   --   HGB 12.3* 13.3 12.0* 11.9*  HCT 40.4 39.0 36.9* 35.6*  MCV 97.3  --  91.1 90.4  PLT 121*  --  143* 143*   Basic Metabolic Panel: Recent Labs  Lab 01/31/18 0643 02/01/18 0249 02/02/18 0535 02/03/18 0224 02/04/18 0653  NA 140 140 140 140 138  K 5.7* 4.2 3.7 3.7 3.6  CL 107 104 107 107 107  CO2 24 25 25 24 23   GLUCOSE 109* 89 93 97 93  BUN 22 25*  20 16 21   CREATININE 1.31* 1.35* 1.29* 1.29* 1.25*  CALCIUM 9.2 9.5 8.8* 8.7* 9.0   GFR: Estimated Creatinine Clearance: 40.2 mL/min (A) (by C-G formula based on SCr of 1.25 mg/dL (H)). Liver Function Tests: Recent Labs  Lab 01/30/18 1536  AST 23  ALT 16  ALKPHOS 46  BILITOT 0.8  PROT 6.5  ALBUMIN 3.7   No results for input(s): LIPASE, AMYLASE in the last 168 hours. No results for input(s): AMMONIA in the last 168 hours. Coagulation Profile: Recent Labs  Lab 01/30/18 1536  INR 1.34   Cardiac Enzymes: No results for input(s): CKTOTAL, CKMB, CKMBINDEX, TROPONINI in the last 168 hours. BNP (last 3 results) No results for input(s): PROBNP in the last 8760 hours. HbA1C: No results for input(s): HGBA1C in the last 72 hours. CBG: Recent Labs  Lab 01/30/18 1539 01/30/18 1840 01/31/18 1149  GLUCAP 77 81 66*   Lipid Profile: No results for input(s): CHOL, HDL, LDLCALC, TRIG, CHOLHDL, LDLDIRECT in the last 72 hours. Thyroid Function Tests: No results for input(s): TSH, T4TOTAL, FREET4, T3FREE, THYROIDAB in the last 72 hours. Anemia Panel: No results for input(s): VITAMINB12, FOLATE, FERRITIN, TIBC, IRON, RETICCTPCT in the last 72 hours. Urine analysis:    Component Value Date/Time   COLORURINE YELLOW 01/30/2018 1746   APPEARANCEUR CLEAR 01/30/2018 1746   LABSPEC 1.016 01/30/2018 1746   PHURINE 6.0 01/30/2018 1746   GLUCOSEU  NEGATIVE 01/30/2018 1746   HGBUR NEGATIVE 01/30/2018 1746   BILIRUBINUR NEGATIVE 01/30/2018 1746   KETONESUR NEGATIVE 01/30/2018 1746   PROTEINUR NEGATIVE 01/30/2018 1746   NITRITE NEGATIVE 01/30/2018 1746   LEUKOCYTESUR NEGATIVE 01/30/2018 1746     Ripudeep Rai M.D. [facility] 02/04/2018, 9:47 AM  Pager: [phone] Between 7am to 7pm - call Pager - [phone]  After 7pm go to [email or web address] - password [email or web address]  Call night coverage person covering after 7pm

## 2018-02-04 NOTE — Discharge Instructions (Signed)

## 2018-02-04 NOTE — Progress Notes (Signed)
ANTICOAGULATION CONSULT NOTE  Pharmacy Consult for heparin > apixaban Indication: atrial fibrillation  Not on File  Patient Measurements: Height: 5\' 10"  (177.8 cm) Weight: 145 lb 1 oz (65.8 kg) IBW/kg (Calculated) : 73 Heparin Dosing Weight: 70kg  Vital Signs: Temp: 97.1 F (36.2 C) (01/21 1123) Temp Source: Axillary (01/21 1123) BP: 139/66 (01/21 1050) Pulse Rate: 58 (01/21 1123)  Labs: Recent Labs    02/02/18 0535  02/02/18 2229 02/03/18 0224 02/04/18 0249 02/04/18 0653  HGB  --   --   --  12.0* 11.9*  --   HCT  --   --   --  36.9* 35.6*  --   PLT  --   --   --  143* 143*  --   APTT 94*   < > 87* 77* 84*  --   HEPARINUNFRC 1.56*  --   --  0.86* 0.64  --   CREATININE 1.29*  --   --  1.29*  --  1.25*   < > = values in this interval not displayed.    Estimated Creatinine Clearance: 40.2 mL/min (A) (by C-G formula based on SCr of 1.25 mg/dL (H)).   Assessment: 85 y.o. male with h/o Afib. Eliquis was on hold while awaiting angiogram and patient was being managed on IV heparin. Patient is now s/p U/S guided peripheral catheterization and stenting of left renal artery. Pharmacy now consulted to resume apixaban. H/H low stable, Plt low. SCr 1.25.   Goal of Therapy:  Stroke prevention Monitor platelets by anticoagulation protocol: Yes   Plan:  Stop IV heparin Resume apixaban 5 mg twice daily tonight  Monitor renal fx and s/s of bleeding   Vinnie Level, PharmD., BCPS Clinical Pharmacist Clinical phone for 02/04/18 until 3:30pm: [phone] If after 3:30pm, please refer to [facility] for unit-specific pharmacist

## 2018-02-04 NOTE — Interval H&P Note (Signed)
History and Physical Interval Note:  02/04/2018 8:46 AM  Harrietta Guardian  has presented today for surgery, with the diagnosis of renal artery stenosis  The various methods of treatment have been discussed with the patient and family. After consideration of risks, benefits and other options for treatment, the patient has consented to  Procedure(s): ABDOMINAL AORTOGRAM (N/A) as a surgical intervention .  The patient's history has been reviewed, patient examined, no change in status, stable for surgery.  I have reviewed the patient's chart and labs.  Questions were answered to the patient's satisfaction.     Durene Cal

## 2018-02-04 NOTE — Progress Notes (Signed)
Progress Note  Patient Name: Tyler Carr Date of Encounter: 02/04/2018  Primary Cardiologist:   No primary care provider on file.   Subjective   No chest pain.  No SOB.   Inpatient Medications    Scheduled Meds: . amLODipine  5 mg Oral Daily  . carvedilol  12.5 mg Oral BID  . furosemide  40 mg Oral Daily  . hydrALAZINE  50 mg Oral Q8H  . lisinopril  40 mg Oral Daily  . simvastatin  20 mg Oral QHS  . sodium chloride flush  3 mL Intravenous Q12H  . tamsulosin  0.4 mg Oral Daily   Continuous Infusions: . sodium chloride 100 mL/hr at 02/04/18 0747  . heparin Stopped (02/04/18 0902)   PRN Meds: acetaminophen **OR** acetaminophen, hydrALAZINE   Vital Signs    Vitals:   02/04/18 1045 02/04/18 1050 02/04/18 1055 02/04/18 1123  BP: 132/75 139/66    Pulse: (!) 59 65 (!) 0 (!) 58  Resp: 15 13 (!) 0   Temp:    (!) 97.1 F (36.2 C)  TempSrc:    Axillary  SpO2: 98% 98% (!) 0% 95%  Weight:      Height:        Intake/Output Summary (Last 24 hours) at 02/04/2018 1205 Last data filed at 02/04/2018 1058 Gross per 24 hour  Intake 537.51 ml  Output 300 ml  Net 237.51 ml   Filed Weights   02/02/18 0639 02/03/18 0600 02/04/18 0440  Weight: 65.9 kg 64.9 kg 65.8 kg    Telemetry    Atrial fib with controlled rate.  No prolonged bradycardia Personally Reviewed  ECG    NA - Personally Reviewed  Physical Exam   GEN: No  acute distress.   Neck: No  JVD Cardiac: Irregular RR, no murmurs, rubs, or gallops.  Respiratory: Clear   to auscultation bilaterally. GI: Soft, nontender, non-distended, normal bowel sounds  MS:  No edema; No deformity.  Right femoral artery access site without bleeding or bruising.  Neuro:   Nonfocal  Psych: Oriented and appropriate   Labs    Chemistry Recent Labs  Lab 01/30/18 1536  02/02/18 0535 02/03/18 0224 02/04/18 0653  NA 142   < > 140 140 138  K 4.2   < > 3.7 3.7 3.6  CL 110   < > 107 107 107  CO2 22   < > 25 24 23   GLUCOSE 89    < > 93 97 93  BUN 24*   < > 20 16 21   CREATININE 1.29*   < > 1.29* 1.29* 1.25*  CALCIUM 9.1   < > 8.8* 8.7* 9.0  PROT 6.5  --   --   --   --   ALBUMIN 3.7  --   --   --   --   AST 23  --   --   --   --   ALT 16  --   --   --   --   ALKPHOS 46  --   --   --   --   BILITOT 0.8  --   --   --   --   GFRNONAA 50*   < > 50* 50* 52*  GFRAA 58*   < > 58* 58* >60  ANIONGAP 10   < > 8 9 8    < > = values in this interval not displayed.     Hematology Recent Labs  Lab 01/30/18 1536 01/30/18 1552  02/03/18 0224 02/04/18 0249  WBC 4.9  --  5.6 4.5  RBC 4.15*  --  4.05* 3.94*  HGB 12.3* 13.3 12.0* 11.9*  HCT 40.4 39.0 36.9* 35.6*  MCV 97.3  --  91.1 90.4  MCH 29.6  --  29.6 30.2  MCHC 30.4  --  32.5 33.4  RDW 13.9  --  13.9 13.9  PLT 121*  --  143* 143*    Cardiac EnzymesNo results for input(s): TROPONINI in the last 168 hours.  Recent Labs  Lab 01/30/18 1550  TROPIPOC 0.01     BNPNo results for input(s): BNP, PROBNP in the last 168 hours.   DDimer No results for input(s): DDIMER in the last 168 hours.   Radiology    No results found.  Cardiac Studies   ECHO  Study Conclusions  - Left ventricle: The cavity size was normal. Wall thickness was   increased in a pattern of moderate LVH. The estimated ejection   fraction was 50%. Diffuse hypokinesis. The study was not   technically sufficient to allow evaluation of LV diastolic   dysfunction due to atrial fibrillation. - Aortic valve: There was no stenosis. There was mild   regurgitation. - Aorta: Dilated ascending aorta. Ascending aortic diameter: 44 mm   (S). - Mitral valve: There was mild regurgitation. - Left atrium: The atrium was severely dilated. - Right ventricle: The cavity size was normal. Systolic function   was mildly reduced. - Right atrium: The atrium was severely dilated. - Tricuspid valve: Peak RV-RA gradient (S): 46 mm Hg. - Pulmonary arteries: PA peak pressure: 61 mm Hg (S). - Systemic veins: IVC  measured 2.4 cm with < 50% respirophasic   variation, suggesting RA pressure 15 mmHg. - Pericardium, extracardiac: A trivial pericardial effusion was   identified.   Patient Profile     85 y.o. male with advanced dementia[facility], history of atrial fibrillation permanent with tachycardia-bradycardia syndrome with occasional heart rates in the 40s. About 3 years ago was noted to have pauses with his atrial fibrillation and pacemaker was recommended but declined.   Assessment & Plan    CHRONIC ATRIAL FIB WITH TACHY BRADY:   Resume Eliquis tonight.  No change in therapy.  Tolerating beta blocker.  Call with further questions.   HTN:  Status post stent to RA today.  BP is well controlled on current meds.    For questions or updates, please contact [facility] Please consult [email or web address] for contact info under Cardiology/STEMI.   Signed, Rollene RotundaJames Alarik Radu, MD  02/04/2018, 12:05 PM

## 2018-02-04 NOTE — Op Note (Signed)
    Patient name: Tyler Carr MRN: [id number] DOB: [birth year] Sex: male  02/04/2018 Pre-operative Diagnosis: Left renal artery stenosis Post-operative diagnosis:  Same Surgeon:  Durene Cal Procedure Performed:  1.  Ultrasound-guided access, right femoral artery  2.  Abdominal gram  3.  Peripheral catheterization (left renal artery)  4.  Left renal artery stent (6 x 12)  5.  Conscious sedation (44 minutes)    Indications: The patient is on 5 medications for his blood pressure.  Ultrasound revealed left renal artery stenosis.  He is here today for further evaluation.  Procedure:  The patient was identified in the holding area and taken to room 8.  The patient was then placed supine on the table and prepped and draped in the usual sterile fashion.  A time out was called.  Conscious sedation was administered with use of IV fentanyl Versed under continuous physician and nurse monitoring.  Heart rate, blood pressure, oxygen saturation retention monitor.  Ultrasound was used to evaluate the right common femoral artery.  It was patent .  A digital ultrasound image was acquired.  A micropuncture needle was used to access the right common femoral artery under ultrasound guidance.  An 018 wire was advanced without resistance and a micropuncture sheath was placed.  The 018 wire was removed and a benson wire was placed.  The micropuncture sheath was exchanged for a 5 french sheath.  An omniflush catheter was advanced over the wire to the level of L-1.  An abdominal angiogram was obtained.   Findings:   Aortogram: The super mesenteric artery has a exophytic nearly occlusive plaque several centimeters from the origin.  The right renal artery is widely patent.  There is a 90% stenosis within the left renal artery.  The infrarenal abdominal aorta is calcified but patent without significant stenosis.  Bilateral common and external iliac arteries widely patent.  Bilateral femoral arteries are heavily  calcified.  Intervention: After the above images were acquired the decision was made to proceed with intervention.  A IM guide catheter was inserted.  The patient was fully heparinized.  A 014 [place] core wire was used to cannulate the left renal artery.  The lesion was primarily stented using a 6 x 12 Herculink stent, taking it to nominal pressure.  Lesion imaging revealed resolution of the stenosis.  Catheters and wires were removed.  Minx device was used for closure  Impression:  #1  90% left renal artery stenosis with successful stenting using a 6 x 12 Herculink stent with no residual stenosis.  #2  Exophytic heavily calcified nearly occlusive lesion within the superior mesenteric artery  #3  Heavily calcified bilateral femoral arteries.   Juleen China, M.D. [facility] Office: [phone] Pager:  [phone]

## 2018-02-04 NOTE — Progress Notes (Signed)
Pt transferred to cath lab , on stretcher accompanied, by transporter.

## 2018-02-04 NOTE — Progress Notes (Addendum)
Pt returned to unit.Right femoral soft , palpable . No bleeding noted.

## 2018-02-05 ENCOUNTER — Telehealth: Payer: Self-pay | Admitting: Surgery

## 2018-02-05 DIAGNOSIS — E7800 Pure hypercholesterolemia, unspecified: Secondary | ICD-10-CM

## 2018-02-05 LAB — CBC
HEMATOCRIT: 37.2 % — AB (ref 39.0–52.0)
Hemoglobin: 11.9 g/dL — ABNORMAL LOW (ref 13.0–17.0)
MCH: 29.3 pg (ref 26.0–34.0)
MCHC: 32 g/dL (ref 30.0–36.0)
MCV: 91.6 fL (ref 80.0–100.0)
Platelets: 143 10*3/uL — ABNORMAL LOW (ref 150–400)
RBC: 4.06 MIL/uL — ABNORMAL LOW (ref 4.22–5.81)
RDW: 13.9 % (ref 11.5–15.5)
WBC: 5.1 10*3/uL (ref 4.0–10.5)
nRBC: 0 % (ref 0.0–0.2)

## 2018-02-05 LAB — BASIC METABOLIC PANEL
Anion gap: 10 (ref 5–15)
BUN: 22 mg/dL (ref 8–23)
CHLORIDE: 108 mmol/L (ref 98–111)
CO2: 23 mmol/L (ref 22–32)
Calcium: 8.9 mg/dL (ref 8.9–10.3)
Creatinine, Ser: 1.3 mg/dL — ABNORMAL HIGH (ref 0.61–1.24)
GFR calc Af Amer: 58 mL/min — ABNORMAL LOW (ref >60)
GFR calc non Af Amer: 50 mL/min — ABNORMAL LOW (ref >60)
Glucose, Bld: 93 mg/dL (ref 70–99)
Potassium: 3.4 mmol/L — ABNORMAL LOW (ref 3.5–5.1)
Sodium: 141 mmol/L (ref 135–145)

## 2018-02-05 MED ORDER — POTASSIUM CHLORIDE CRYS ER 20 MEQ PO TBCR
40.0000 meq | EXTENDED_RELEASE_TABLET | Freq: Once | ORAL | Status: AC
  Administered 2018-02-05: 40 meq via ORAL
  Filled 2018-02-05: qty 2

## 2018-02-05 MED ORDER — TAMSULOSIN HCL 0.4 MG PO CAPS: 0.4000 mg | ORAL_CAPSULE | Freq: Every day | ORAL | 0 refills | Status: DC

## 2018-02-05 MED ORDER — AMLODIPINE BESYLATE 5 MG PO TABS: 5.0000 mg | ORAL_TABLET | Freq: Every day | ORAL | 0 refills | Status: DC

## 2018-02-05 MED ORDER — ASPIRIN 81 MG PO TBEC: 81.0000 mg | DELAYED_RELEASE_TABLET | Freq: Every day | ORAL | 2 refills | Status: DC

## 2018-02-05 MED ORDER — HYDRALAZINE HCL 50 MG PO TABS: 50.0000 mg | ORAL_TABLET | Freq: Three times a day (TID) | ORAL | 0 refills | Status: DC

## 2018-02-05 NOTE — Telephone Encounter (Signed)
sch appt spk to pt daughter mld ltr 03/10/2018 8am Renal 9am p/o MD

## 2018-02-05 NOTE — Care Management Important Message (Signed)
Important Message  Patient Details  Name: Tyler Carr MRN: [id number] Date of Birth: [birth year]   Medicare Important Message Given:  Yes    Makana Feigel P Alexandrea Westergard 02/05/2018, 10:13 AM

## 2018-02-05 NOTE — Progress Notes (Addendum)
  Progress Note    02/05/2018 8:10 AM 1 Day Post-Op  Subjective:  No complaints; sitting up in bed eating breakfast  Tm 99.1  Vitals:   02/05/18 0426 02/05/18 0513  BP: 136/65 139/75  Pulse:    Resp: 17   Temp: 99.1 F (37.3 C)   SpO2:      Physical Exam: General:  No distress Lungs:  Non labored Incisions:  Right groin is soft without hematoma Extremities:  Bilateral feet are warm and well perfused.    CBC    Component Value Date/Time   WBC 5.1 02/05/2018 0219   RBC 4.06 (L) 02/05/2018 0219   HGB 11.9 (L) 02/05/2018 0219   HCT 37.2 (L) 02/05/2018 0219   PLT 143 (L) 02/05/2018 0219   MCV 91.6 02/05/2018 0219   MCH 29.3 02/05/2018 0219   MCHC 32.0 02/05/2018 0219   RDW 13.9 02/05/2018 0219   LYMPHSABS 1.3 01/30/2018 1536   MONOABS 0.5 01/30/2018 1536   EOSABS 0.1 01/30/2018 1536   BASOSABS 0.0 01/30/2018 1536    BMET    Component Value Date/Time   NA 141 02/05/2018 0219   K 3.4 (L) 02/05/2018 0219   CL 108 02/05/2018 0219   CO2 23 02/05/2018 0219   GLUCOSE 93 02/05/2018 0219   BUN 22 02/05/2018 0219   CREATININE 1.30 (H) 02/05/2018 0219   CALCIUM 8.9 02/05/2018 0219   GFRNONAA 50 (L) 02/05/2018 0219   GFRAA 58 (L) 02/05/2018 0219    INR    Component Value Date/Time   INR 1.34 01/30/2018 1536     Intake/Output Summary (Last 24 hours) at 02/05/2018 0810 Last data filed at 02/04/2018 2220 Gross per 24 hour  Intake 161.85 ml  Output 300 ml  Net -138.15 ml     Assessment:  85 y.o. male is s/p:  Procedure Performed:             1.  Ultrasound-guided access, right femoral artery             2.  Abdominal gram             3.  Peripheral catheterization (left renal artery)             4.  Left renal artery stent (6 x 12)             5.  Conscious sedation (44 minutes)  1 Day Post-Op   Plan: -pt's right groin soft without hematoma -f/u in 4 weeks with duplex -blood pressure good control today   Doreatha Massed, PA-C [facility] [phone] 02/05/2018 8:10 AM   I agree with the above. POD#1. S/p left renal stenting.  Access sit ok.  Eliquis restarted.  Remains on ASA.  Recommend both for at least 3 months.  I will have patient follow up in 6 months for repeat duplex of stent.  I will  Sign off.  Please call with any questions.  Durene Cal

## 2018-02-05 NOTE — Discharge Summary (Signed)
Physician Discharge Summary  Tyler GuardianMarvin Hottel ZOX:[id number] DOB: 06/02/1930 DOA: 01/30/2018  PCP: Patient, No Pcp Per  Admit date: 01/30/2018 Discharge date: 02/05/2018  Time spent: 45 minutes  Recommendations for Outpatient Follow-up:  Patient will be discharged to home with home health physical therapy.  Patient will need to follow up with primary care provider within one week of discharge.  Follow-up with Dr. Myra GianottiBrabham, vascular surgery, in 6 weeks.  Patient should continue medications as prescribed.  Patient should follow a heart healthy diet.   Discharge Diagnoses:  Principal Problem:   Symptomatic bradycardia Active Problems:   Chronic systolic CHF (congestive heart failure) (HCC)   Hyperlipidemia   Accelerated hypertension   Permanent atrial fibrillation/tachybradycardia syndrome   Hypothyroidism   Hyperkalemia with acute renal insufficiency versus chronic kidney disease, stage III   Dementia   Physical deconditioning    Slurred speech  Discharge Condition: stable  Diet recommendation: Heart healthy  Filed Weights   02/03/18 0600 02/04/18 0440 02/05/18 0554  Weight: 64.9 kg 65.8 kg 64.6 kg    History of present illness:  On 01/30/2018 by Dr. Debe CoderEmily Mullen Tyler GuardianMarvin Carr is a 85 y.o. male with medical history significant of apparently CAD, CHF, possibly Afib who presents for slurred speech.  [name] [facility] a patient of [facility] and we have no records in our system.  His family is present with him (2 great nieces and a nephew) however, they do not know all of his history.  I was able to review his medications as they were brought in with him.  The history of presentation is that the patient [facility] and was started on clonidine for elevated blood pressure.  He took the clonidine the first time overnight and when he awoke this morning he had swelling of the lips and slurred speech.  He states that he felt like he could not adequately move his jaw.  He has dementia and  some memory issues regarding his medical care.  His nieces filled in some blanks.  On examination in the ED, he was bradycardic into the 30s with BP which was preserved.  He is alert and responsive.  His niece states that his HR this morning was around 60.  He has z pads in place.  He denies any chest pain, confusion (more than baseline), weakness unilateral, headache, vision changes, dizziness, lightheadedness, recent fever chills or nausea, vomiting.  No changes in bowel or bladder habits.   Hospital Course:  Symptomatic bradycardia -Patient presented with slurred speech, which has now resolved -Suspected combination due to clonidine, Coreg, Norvasc.  Patient also had reported nausea however no vomiting.  Suspect possible vagal effect as well. -Bradycardia has resolved, Coreg has been restarted  Accelerated hypertension/uncontrolled hypertension likely secondary to renal artery stenosis -Cardiology and vascular surgery consulted and appreciated -Echocardiogram showed an EF of 50% with moderate concentric LVH, mild LV systolic dysfunction. -Renal artery duplex showed > 60% stenosis on the right with elevated resistance index, left 1 to 59% stenosis. -Status post renal angiogram: Ultrasound-guided access, right femoral artery.  Abdominal cramps, peripheral catheterization left renal artery, left renal artery stent 6 x 12 -Dr. Myra GianottiBrabham recommending Eliquis and aspirin for at least 3 months.  Patient to follow-up with repeat duplex of stent in 6 months.  Permanent atrial fibrillation/tachybradycardia syndrome -As above, cardiology consulted and appreciated.  No indication for pacemaker at this time -Continue Coreg as patient has had no episodes of syncope during admission -Patient was placed  on heparin however Eliquis has been restarted  Chronic systolic heart failure -Echocardiogram as above, EF of 50% with moderate concentric LVH, mild LV systolic dysfunction -Patient appears to be euvolemic and  compensated at this time  Hypothyroidism -TSH was elevated 7.3, however T4 and T3 were normal  Hyperkalemia with acute renal insufficiency versus chronic kidney disease, stage III -Baseline creatinine unknown, however has been stable between 1.2 and 1.3 during hospitalization -Aldactone was held due to hyperkalemia -Repeat BMP in 1 week  Hyperlipidemia -Continue simvastatin  Dementia -Stable  Physical deconditioning  -PT recommended HH  Slurred speech -Resolved, suspect due to accelerated hypertension and bradycardia -CT head no evidence of acute or cranial normality -Neurology was consulted, no further work-up  Procedures: Echocardiogram  Renal angiogram 1. Ultrasound-guided access, right femoral artery 2. Abdominal gram 3. Peripheral catheterization (left renal artery) 4. Left renal artery stent (6 x 12) 5. Conscious sedation (44 minutes)   Consultations: Cardiology Vascular surgery Neurology  Discharge Exam: Vitals:   02/05/18 0513 02/05/18 1021  BP: 139/75 (!) 149/75  Pulse:    Resp:    Temp:    SpO2:       General: Well developed, well nourished, NAD, appears stated age  HEENT: NCAT, mucous membranes moist.  Neck: Supple  Cardiovascular: S1 S2 auscultated, irregular, no murmur  Respiratory: Clear to auscultation bilaterally with equal chest rise  Abdomen: Soft, nontender, nondistended, + bowel sounds  Extremities: warm dry without cyanosis clubbing or edema  Neuro: AAOx3, nonfocal  Psych: Normal affect and demeanor   Discharge Instructions Discharge Instructions    Discharge instructions   Complete by:  As directed    Patient will be discharged to home with home health physical therapy.  Patient will need to follow up with primary care provider within one week of discharge.  Follow-up with Dr. Myra GianottiBrabham, vascular surgery, in 6 weeks.  Patient should continue medications as  prescribed.  Patient should follow a heart healthy diet.     Allergies as of 02/05/2018   Not on File     Medication List    STOP taking these medications   cloNIDine 0.2 MG tablet Commonly known as:  CATAPRES   spironolactone 25 MG tablet Commonly known as:  ALDACTONE     TAKE these medications   amLODipine 5 MG tablet Commonly known as:  NORVASC Take 1 tablet (5 mg total) by mouth daily. Start taking on:  February 06, 2018 What changed:    medication strength  how much to take   aspirin 81 MG EC tablet Take 1 tablet (81 mg total) by mouth daily. Start taking on:  February 06, 2018   carvedilol 25 MG tablet Commonly known as:  COREG Take 12.5 mg by mouth 2 (two) times daily.   ELIQUIS 5 MG Tabs tablet Generic drug:  apixaban Take 5 mg by mouth 2 (two) times daily.   furosemide 40 MG tablet Commonly known as:  LASIX Take 40 mg by mouth daily.   hydrALAZINE 50 MG tablet Commonly known as:  APRESOLINE Take 1 tablet (50 mg total) by mouth every 8 (eight) hours.   lisinopril 40 MG tablet Commonly known as:  PRINIVIL,ZESTRIL Take 40 mg by mouth daily.   Melatonin 3 MG Tabs Take 3-6 mg by mouth at bedtime as needed (sleep and agitation).   simvastatin 40 MG tablet Commonly known as:  ZOCOR Take 20 mg by mouth at bedtime.   tamsulosin 0.4 MG Caps capsule Commonly known as:  FLOMAX  Take 1 capsule (0.4 mg total) by mouth daily. Start taking on:  February 06, 2018      Not on File Follow-up Information    primary care physician. Schedule an appointment as soon as possible for a visit.   Why:  Hospital follow up        Nada Libman, MD. Schedule an appointment as soon as possible for a visit in 5 week(s).   Specialties:  Vascular Surgery, Cardiology Why:  Hospital follow up  Contact information: [address] [place] [address] [phone]        [facility] [place] Follow up.   Why:  Hospital follow up, kidney function Contact  information: [address] [place] [address] [phone]            The results of significant diagnostics from this hospitalization (including imaging, microbiology, ancillary and laboratory) are listed below for reference.    Significant Diagnostic Studies: Ct Head Code Stroke Wo Contrast  Result Date: 01/30/2018 CLINICAL DATA:  Code stroke.  Slurred speech. EXAM: CT HEAD WITHOUT CONTRAST TECHNIQUE: Contiguous axial images were obtained from the base of the skull through the vertex without intravenous contrast. COMPARISON:  None. FINDINGS: Brain: There is no evidence of acute infarct, intracranial hemorrhage, mass, midline shift, or extra-axial fluid collection. Generalized cerebral atrophy is relatively mild for age. There is a small chronic left cerebellar infarct. Patchy hypodensities in the cerebral white matter bilaterally are nonspecific but compatible with moderate chronic small vessel ischemic disease. Vascular: Calcified atherosclerosis at the skull base. No hyperdense vessel. Skull: No fracture or focal osseous lesion. Sinuses/Orbits: Small mucous retention cysts in the maxillary sinuses. Clear mastoid air cells. Unremarkable orbits. 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 ASPECTS [facility] Stroke Program Early CT Score) - Ganglionic level infarction (caudate, lentiform nuclei, internal capsule, insula, M1-M3 cortex): 7 - Supraganglionic infarction (M4-M6 cortex): 3 Total score (0-10 with 10 being normal): 10 IMPRESSION: 1. No evidence of acute intracranial abnormality. 2. ASPECTS is 10. 3. Moderate chronic small vessel ischemic disease. 4. Chronic left cerebellar infarct. These results were called by telephone at the time of interpretation on 01/30/2018 at 3:52 pm to Dr. Scherrie November, who verbally acknowledged these results. Electronically Signed   By: Sebastian Ache M.D.   On: 01/30/2018 15:53   Vas US Renal Artery Duplex  Result Date: 02/02/2018 ABDOMINAL VISCERAL Indications: HTN  Accelerated High Risk Factors: Hypertension. Limitations: Air/bowel gas, patient discomfort and cannot hold breath and short of breath. Comparison Study: No comparison study available Performing Technologist: Melodie Bouillon  Examination Guidelines: A complete evaluation includes B-mode imaging, spectral Doppler, color Doppler, and power Doppler as needed of all accessible portions of each vessel. Bilateral testing is considered an integral part of a complete examination. Limited examinations for reoccurring indications may be performed as noted.  Duplex Findings: +--------------------+--------+--------+------+--------+ Mesenteric          PSV cm/sEDV cm/sPlaqueComments +--------------------+--------+--------+------+--------+ Aorta Mid              64      17                  +--------------------+--------+--------+------+--------+ Celiac Artery Origin  187      20                  +--------------------+--------+--------+------+--------+ SMA Origin            392                          +--------------------+--------+--------+------+--------+  +------------------+--------+--------+-------+  Right Renal ArteryPSV cm/sEDV cm/sComment +------------------+--------+--------+-------+ Origin              225      55           +------------------+--------+--------+-------+ Proximal             76      19           +------------------+--------+--------+-------+ Mid                  95      27           +------------------+--------+--------+-------+ Distal               93      27           +------------------+--------+--------+-------+ +-----------------+--------+--------+-------+ Left Renal ArteryPSV cm/sEDV cm/sComment +-----------------+--------+--------+-------+ Origin             125      30           +-----------------+--------+--------+-------+ Proximal           130      43           +-----------------+--------+--------+-------+ Mid                 29       7            +-----------------+--------+--------+-------+ Distal              36      7            +-----------------+--------+--------+-------+ +------------+--------+--------+----+-----------+--------+--------+----+ Right KidneyPSV cm/sEDV cm/sRI  Left KidneyPSV cm/sEDV cm/sRI   +------------+--------+--------+----+-----------+--------+--------+----+ Upper Pole  25      6       0.78Upper Pole 23      9       0.48 +------------+--------+--------+----+-----------+--------+--------+----+ Mid         31      6       0.        23      7       0.68 +------------+--------+--------+----+-----------+--------+--------+----+ Lower Pole  21      6       0.72Lower Pole 25      10      0.61 +------------+--------+--------+----+-----------+--------+--------+----+ Hilar       25      7       0.70Hilar      25      8       0.68 +------------+--------+--------+----+-----------+--------+--------+----+ +------------------+-----+------------------+----+ Right Kidney           Left Kidney            +------------------+-----+------------------+----+ RAR                    RAR                    +------------------+-----+------------------+----+ RAR (manual)      3.52 RAR (manual)      2.03 +------------------+-----+------------------+----+ Cortex                 Cortex                 +------------------+-----+------------------+----+ Cortex thickness       Corex thickness        +------------------+-----+------------------+----+ Kidney length (cm)10.80Kidney length (cm)9.90 +------------------+-----+------------------+----+  Summary: Renal:  Right: Evidence of a greater than 60% stenosis of the right renal  artery. RRV flow present. Abnormal right Resistive Index.        Normal size right kidney. Left:  1-59% stenosis of the left renal artery. LRV flow present.        Normal left Resistive Index. Normal size of left kidney.        Cyst(s)  noted.        Incidental finding: Multiple cystic structure seen on the        border of left kidney, further evaluation needed through        other modality if clinical concern. Mesenteric: Normal Celiac artery findings. 70 to 99% stenosis in the superior mesenteric artery.  *See table(s) above for measurements and observations.  Diagnosing physician: Coral Else MD  Electronically signed by Coral Else MD on 02/02/2018 at 5:02:57 PM.    Final     Microbiology: Recent Results (from the past 240 hour(s))  MRSA PCR Screening     Status: None   Collection Time: 01/30/18  8:50 PM  Result Value Ref Range Status   MRSA by PCR NEGATIVE NEGATIVE Final    Comment:        The GeneXpert MRSA Assay (FDA approved for NASAL specimens only), is one component of a comprehensive MRSA colonization surveillance program. It is not intended to diagnose MRSA infection nor to guide or monitor treatment for MRSA infections. Performed at [facility], [address] [place], [place] [address]      Labs: Basic Metabolic Panel: Recent Labs  Lab 02/01/18 0249 02/02/18 0535 02/03/18 0224 02/04/18 0653 02/05/18 0219  NA 140 140 140 138 141  K 4.2 3.7 3.7 3.6 3.4*  CL 104 107 107 107 108  CO2 25 25 24 23 23   GLUCOSE 89 93 97 93 93  BUN 25* 20 16 21 22   CREATININE 1.35* 1.29* 1.29* 1.25* 1.30*  CALCIUM 9.5 8.8* 8.7* 9.0 8.9   Liver Function Tests: Recent Labs  Lab 01/30/18 1536  AST 23  ALT 16  ALKPHOS 46  BILITOT 0.8  PROT 6.5  ALBUMIN 3.7   No results for input(s): LIPASE, AMYLASE in the last 168 hours. No results for input(s): AMMONIA in the last 168 hours. CBC: Recent Labs  Lab 01/30/18 1536 01/30/18 1552 02/03/18 0224 02/04/18 0249 02/05/18 0219  WBC 4.9  --  5.6 4.5 5.1  NEUTROABS 3.0  --   --   --   --   HGB 12.3* 13.3 12.0* 11.9* 11.9*  HCT 40.4 39.0 36.9* 35.6* 37.2*  MCV 97.3  --  91.1 90.4 91.6  PLT 121*  --  143* 143* 143*   Cardiac Enzymes: No results for  input(s): CKTOTAL, CKMB, CKMBINDEX, TROPONINI in the last 168 hours. BNP: BNP (last 3 results) No results for input(s): BNP in the last 8760 hours.  ProBNP (last 3 results) No results for input(s): PROBNP in the last 8760 hours.  CBG: Recent Labs  Lab 01/30/18 1539 01/30/18 1840 01/31/18 1149  GLUCAP 77 81 66*       Signed:  Nafeesa Dils  [facility] 02/05/2018, 12:22 PM

## 2018-02-05 NOTE — Progress Notes (Signed)
     [facility] will sign off.   Medication Recommendations:  As on MAR.  Other recommendations (labs, testing, etc):  None. Follow up as an outpatient:  Follows up at [facility]

## 2018-02-05 NOTE — Care Management Note (Addendum)
Case Management Note  Patient Details  Name: Tyler Carr MRN: [id number] Date of Birth: 1930/06/30  Subjective/Objective:    Pt originally admitted for stroke.however CT was negative, while admitted pt experienced acceleration HTN and renal insuffiency          Action/Plan:   PTA from home with wife - wife will provide 24 hour supervision at discharge.  CM provided medicare.gov HH list to wife/pt - CM will follow back up   Expected Discharge Date:  02/05/18               Expected Discharge Plan:  Home w Home Health Services  In-House Referral:     Discharge planning Services  CM Consult  Post Acute Care Choice:    Choice offered to:  Patient, Spouse  DME Arranged:    DME Agency:     HH Arranged:  PT HH Agency:  [facility]  Status of Service:  Completed, signed off  If discussed at Long Length of Stay Meetings, dates discussed:    Additional Comments: Pt to discharge home today.  Wife chose [facility] - agency contacted and referral accepted.  Agency informed pt will discharge home today. Cherylann Parr, RN 02/05/2018, 1:28 PM

## 2018-02-05 NOTE — Telephone Encounter (Signed)
-----   Message from Dara Lords, [place] sent at 02/05/2018  8:36 AM EST ----- S/p renal artery stent placement.  F/u with Dr. Myra Gianotti in 4 weeks with renal artery duplex.  Thanks

## 2018-02-05 NOTE — Plan of Care (Signed)
Problem: Education: Goal: Knowledge of disease or condition will improve 02/05/2018 1426 by Marlou SaMcKinney, Quinnley Colasurdo D, RN Outcome: Progressing 02/05/2018 1423 by Marlou SaMcKinney, Charlena Haub D, RN Outcome: Progressing Goal: Understanding of medication regimen will improve 02/05/2018 1426 by Marlou SaMcKinney, Jessilynn Taft D, RN Outcome: Progressing 02/05/2018 1423 by Marlou SaMcKinney, Aftin Lye D, RN Outcome: Progressing Goal: Individualized Educational Video(s) 02/05/2018 1426 by Marlou SaMcKinney, Latayna Ritchie D, RN Outcome: Progressing 02/05/2018 1423 by Marlou SaMcKinney, Kawon Willcutt D, RN Outcome: Progressing   Problem: Activity: Goal: Ability to tolerate increased activity will improve 02/05/2018 1426 by Marlou SaMcKinney, Myleen Brailsford D, RN Outcome: Progressing 02/05/2018 1423 by Marlou SaMcKinney, Faizah Kandler D, RN Outcome: Progressing   Problem: Cardiac: Goal: Ability to achieve and maintain adequate cardiopulmonary perfusion will improve 02/05/2018 1426 by Marlou SaMcKinney, Cornelis Kluver D, RN Outcome: Progressing 02/05/2018 1423 by Marlou SaMcKinney, Jaylean Buenaventura D, RN Outcome: Progressing   Problem: Health Behavior/Discharge Planning: Goal: Ability to safely manage health-related needs after discharge will improve 02/05/2018 1426 by Marlou SaMcKinney, Richetta Cubillos D, RN Outcome: Progressing 02/05/2018 1423 by Marlou SaMcKinney, Braniyah Besse D, RN Outcome: Progressing   Problem: Education: Goal: Knowledge of General Education information will improve Description Including pain rating scale, medication(s)/side effects and non-pharmacologic comfort measures 02/05/2018 1426 by Marlou SaMcKinney, Dmani Mizer D, RN Outcome: Progressing 02/05/2018 1423 by Marlou SaMcKinney, Tashiana Lamarca D, RN Outcome: Progressing   Problem: Health Behavior/Discharge Planning: Goal: Ability to manage health-related needs will improve 02/05/2018 1426 by Marlou SaMcKinney, Jesse Hirst D, RN Outcome: Progressing 02/05/2018 1423 by Marlou SaMcKinney, Serenity Fortner D, RN Outcome: Progressing   Problem: Clinical Measurements: Goal: Ability to maintain clinical  measurements within normal limits will improve 02/05/2018 1426 by Marlou SaMcKinney, Lavoris Canizales D, RN Outcome: Progressing 02/05/2018 1423 by Marlou SaMcKinney, Kile Kabler D, RN Outcome: Progressing Goal: Will remain free from infection 02/05/2018 1426 by Marlou SaMcKinney, Frankie Scipio D, RN Outcome: Progressing 02/05/2018 1423 by Marlou SaMcKinney, Aris Moman D, RN Outcome: Progressing Goal: Diagnostic test results will improve 02/05/2018 1426 by Marlou SaMcKinney, Deshannon Seide D, RN Outcome: Progressing 02/05/2018 1423 by Hendricks MiloMcKinney, Ciclaly Mulcahey D, RN Outcome: Progressing Goal: Respiratory complications will improve 02/05/2018 1426 by Marlou SaMcKinney, Rexanne Inocencio D, RN Outcome: Progressing 02/05/2018 1423 by Hendricks MiloMcKinney, Ikenna Ohms D, RN Outcome: Progressing Goal: Cardiovascular complication will be avoided 02/05/2018 1426 by Marlou SaMcKinney, Alani Sabbagh D, RN Outcome: Progressing 02/05/2018 1423 by Marlou SaMcKinney, Joahan Swatzell D, RN Outcome: Progressing   Problem: Activity: Goal: Risk for activity intolerance will decrease 02/05/2018 1426 by Marlou SaMcKinney, Jalei Shibley D, RN Outcome: Progressing 02/05/2018 1423 by Marlou SaMcKinney, Yobani Schertzer D, RN Outcome: Progressing   Problem: Nutrition: Goal: Adequate nutrition will be maintained 02/05/2018 1426 by Marlou SaMcKinney, Elianne Gubser D, RN Outcome: Progressing 02/05/2018 1423 by Hendricks MiloMcKinney, Valeriano Bain D, RN Outcome: Progressing   Problem: Coping: Goal: Level of anxiety will decrease 02/05/2018 1426 by Marlou SaMcKinney, Makena Murdock D, RN Outcome: Progressing 02/05/2018 1423 by Marlou SaMcKinney, Alicianna Litchford D, RN Outcome: Progressing   Problem: Elimination: Goal: Will not experience complications related to bowel motility 02/05/2018 1426 by Marlou SaMcKinney, Carmeron Heady D, RN Outcome: Progressing 02/05/2018 1423 by Marlou SaMcKinney, Destry Bezdek D, RN Outcome: Progressing Goal: Will not experience complications related to urinary retention 02/05/2018 1426 by Marlou SaMcKinney, Quindon Denker D, RN Outcome: Progressing 02/05/2018 1423 by Marlou SaMcKinney, Mckinnon Glick D, RN Outcome: Progressing   Problem: Pain  Managment: Goal: General experience of comfort will improve 02/05/2018 1426 by Marlou SaMcKinney, Kory Rains D, RN Outcome: Progressing 02/05/2018 1423 by Marlou SaMcKinney, Aul Mangieri D, RN Outcome: Progressing   Problem: Safety: Goal: Ability to remain free from injury will improve 02/05/2018 1426 by Marlou SaMcKinney, Vina Byrd D, RN Outcome: Progressing 02/05/2018 1423 by Marlou SaMcKinney, Elyjah Hazan D, RN Outcome: Progressing   Problem: Skin Integrity: Goal: Risk for impaired skin integrity will decrease 02/05/2018 1426 by Marlou SaMcKinney, Turrell Severt D,  RN Outcome: Progressing 02/05/2018 1423 by Hendricks MiloMcKinney, Kadi Hession D, RN Outcome: Progressing   Problem: Spiritual Needs Goal: Ability to function at adequate level 02/05/2018 1426 by Marlou SaMcKinney, Marisella Puccio D, RN Outcome: Progressing 02/05/2018 1423 by Marlou SaMcKinney, Charda Janis D, RN Outcome: Progressing

## 2018-02-05 NOTE — Progress Notes (Signed)
Physical Therapy Evaluation Patient Details Name: Tyler Carr MRN: [id number] DOB: September 28, 1930 Today's Date: 02/05/2018   History of Present Illness  Patient is 85 y/o male presenting to hospital with slurred speech secondary to symptomatic bradycardia. CT revealed only chronic changes and negative for CVA.  Patient is s/p abdominal aortogram with L renal artery stent placed 1/21. PMH includes a fib, CHF, accelerated HTN, advanced dementia, CAD, and HLD.   Clinical Impression  Patient admitted to hospital with above problems and with deficits below. Patient ambulated with no AD with close supervision to min guard for safety. 1 LOB noted. Patient very confused and agitated due to baseline cognitive deficits. Wife reports patient seems weaker therefore recommending home health PT. Educated wife about needs for assistance with mobility at home.Patient will benefit from acute physical therapy services to maximize independence and safety with functional mobility.     Follow Up Recommendations Home health PT;Supervision/Assistance - 24 hour    Equipment Recommendations  None recommended by PT    Recommendations for Other Services       Precautions / Restrictions Precautions Precautions: Fall Restrictions Weight Bearing Restrictions: No      Mobility  Bed Mobility Overal bed mobility: Needs Assistance Bed Mobility: Supine to Sit;Sit to Supine     Supine to sit: Supervision Sit to supine: Supervision   General bed mobility comments: Patient required supervision for all bed mobility for safety  Transfers Overall transfer level: Needs assistance Equipment used: None Transfers: Sit to/from Stand Sit to Stand: Min guard         General transfer comment: Patient required min guard for safety without AD  Ambulation/Gait Ambulation/Gait assistance: Supervision;Min guard Gait Distance (Feet): 250 Feet Assistive device: None Gait Pattern/deviations: Step-through pattern;Narrow  base of support;Drifts right/left Gait velocity: WFL   General Gait Details: Patient ambulated with close supervision to min guard for safety due to mild unsteadiness. 1 LOB noted requiring min guard for steadying. Educated about safe gait speed to both patient and patient's wife  Stairs Stairs: Yes       General stair comments: Verbal education provided to wife about guarding during stair managment and use of step to pattern to increase safety as this was one of wife's primary concern  Wheelchair Mobility    Modified Rankin (Stroke Patients Only)       Balance Overall balance assessment: Needs assistance Sitting-balance support: Feet unsupported;No upper extremity supported Sitting balance-Leahy Scale: Good     Standing balance support: No upper extremity supported Standing balance-Leahy Scale: Fair Standing balance comment: Required close supervision to min guard for safety and steadying while standing.                              Pertinent Vitals/Pain Pain Assessment: Faces Faces Pain Scale: No hurt    Home Living Family/patient expects to be discharged to:: Private residence Living Arrangements: Spouse/significant other Available Help at Discharge: Family;Available 24 hours/day Type of Home: House Home Access: Level entry     Home Layout: Two level Home Equipment: None      Prior Function Level of Independence: Needs assistance   Gait / Transfers Assistance Needed: Wife provided supervision for mobility. Reports patient did not use AD           Hand Dominance        Extremity/Trunk Assessment   Upper Extremity Assessment Upper Extremity Assessment: Overall WFL for tasks assessed    Lower Extremity  Assessment Lower Extremity Assessment: Generalized weakness    Cervical / Trunk Assessment Cervical / Trunk Assessment: Normal  Communication   Communication: No difficulties  Cognition Arousal/Alertness: Awake/alert Behavior During  Therapy: Agitated Overall Cognitive Status: History of cognitive impairments - at baseline Area of Impairment: Orientation;Following commands;Safety/judgement;Memory;Awareness;Problem solving                 Orientation Level: Disoriented to;Place   Memory: Decreased short-term memory;Decreased recall of precautions Following Commands: Follows one step commands with increased time Safety/Judgement: Decreased awareness of deficits;Decreased awareness of safety Awareness: Emergent Problem Solving: Slow processing;Requires verbal cues General Comments: Patient very confused and agitated. Repeatedly asked what doctor sent [place] with limited understanding of current situation and why PT was there.  Oriented to only person. Patient's wife provided answers to home information questions due to patient confusion and agitation with questions.       General Comments General comments (skin integrity, edema, etc.): Patient wife in room for session. Patient wife very helpful in redirecting and encouraging patient to work with PT    Exercises     Assessment/Plan    PT Assessment Patient needs continued PT services  PT Problem List Decreased strength;Decreased range of motion;Decreased activity tolerance;Decreased balance;Decreased mobility;Decreased cognition;Decreased knowledge of use of DME;Decreased safety awareness       PT Treatment Interventions DME instruction;Gait training;Stair training;Functional mobility training;Therapeutic activities;Therapeutic exercise;Balance training;Cognitive remediation;Patient/family education    PT Goals (Current goals can be found in the Care Plan section)  Acute Rehab PT Goals Patient Stated Goal: I want to go home PT Goal Formulation: With patient Time For Goal Achievement: 02/19/18 Potential to Achieve Goals: Good    Frequency Min 3X/week   Barriers to discharge        Co-evaluation               AM-PAC PT "6 Clicks" Mobility  Outcome  Measure Help needed turning from your back to your side while in a flat bed without using bedrails?: None Help needed moving from lying on your back to sitting on the side of a flat bed without using bedrails?: None Help needed moving to and from a bed to a chair (including a wheelchair)?: A Little Help needed standing up from a chair using your arms (e.g., wheelchair or bedside chair)?: A Little Help needed to walk in hospital room?: A Little Help needed climbing 3-5 steps with a railing? : A Little 6 Click Score: 20    End of Session Equipment Utilized During Treatment: Gait belt Activity Tolerance: Treatment limited secondary to agitation Patient left: in bed;with call bell/phone within reach;with family/visitor present Nurse Communication: Mobility status PT Visit Diagnosis: Unsteadiness on feet (R26.81);Other abnormalities of gait and mobility (R26.89)    Time: 4098-1191 PT Time Calculation (min) (ACUTE ONLY): 16 min   Charges:   PT Evaluation $PT Eval Moderate Complexity: 1 Mod          Vanessa Ralphs, SPT  Vanessa Ralphs 02/05/2018, 1:45 PM

## 2018-02-05 NOTE — Plan of Care (Signed)
  Problem: Education: Goal: Knowledge of disease or condition will improve Outcome: Progressing Goal: Understanding of medication regimen will improve Outcome: Progressing Goal: Individualized Educational Video(s) Outcome: Progressing   Problem: Activity: Goal: Ability to tolerate increased activity will improve Outcome: Progressing   Problem: Cardiac: Goal: Ability to achieve and maintain adequate cardiopulmonary perfusion will improve Outcome: Progressing   Problem: Health Behavior/Discharge Planning: Goal: Ability to safely manage health-related needs after discharge will improve Outcome: Progressing   Problem: Education: Goal: Knowledge of General Education information will improve Description Including pain rating scale, medication(s)/side effects and non-pharmacologic comfort measures Outcome: Progressing   Problem: Health Behavior/Discharge Planning: Goal: Ability to manage health-related needs will improve Outcome: Progressing   Problem: Clinical Measurements: Goal: Ability to maintain clinical measurements within normal limits will improve Outcome: Progressing Goal: Will remain free from infection Outcome: Progressing Goal: Diagnostic test results will improve Outcome: Progressing Goal: Respiratory complications will improve Outcome: Progressing Goal: Cardiovascular complication will be avoided Outcome: Progressing   Problem: Activity: Goal: Risk for activity intolerance will decrease Outcome: Progressing   Problem: Nutrition: Goal: Adequate nutrition will be maintained Outcome: Progressing   Problem: Coping: Goal: Level of anxiety will decrease Outcome: Progressing   Problem: Elimination: Goal: Will not experience complications related to bowel motility Outcome: Progressing Goal: Will not experience complications related to urinary retention Outcome: Progressing   Problem: Pain Managment: Goal: General experience of comfort will improve Outcome:  Progressing   Problem: Safety: Goal: Ability to remain free from injury will improve Outcome: Progressing   Problem: Skin Integrity: Goal: Risk for impaired skin integrity will decrease Outcome: Progressing   Problem: Spiritual Needs Goal: Ability to function at adequate level Outcome: Progressing

## 2018-02-05 NOTE — Progress Notes (Signed)
Pt given verbal discharge instructions. Wife at bedside during d/c instructions. Paper AVS also provided.  No further questions. Pt VSS stable at d/c. IV removed per orders. Pt left 2 bags of belongings behind in closet. Secretary called wife and advise belongings would be at sec desk for the next couple days after which will be disposed of. Pt d/c to [facility] via wheelchair by the tech.

## 2018-02-06 LAB — ALDOSTERONE + RENIN ACTIVITY W/ RATIO
ALDO / PRA Ratio: 35.2 — ABNORMAL HIGH (ref 0.0–30.0)
Aldosterone: 9.6 ng/dL (ref 0.0–30.0)
PRA LC/MS/MS: 0.273 ng/mL/hr (ref 0.167–5.380)

## 2018-02-19 ENCOUNTER — Other Ambulatory Visit: Payer: Self-pay

## 2018-02-19 DIAGNOSIS — I701 Atherosclerosis of renal artery: Secondary | ICD-10-CM

## 2018-03-10 ENCOUNTER — Ambulatory Visit (INDEPENDENT_AMBULATORY_CARE_PROVIDER_SITE_OTHER): Payer: Medicare Other | Admitting: Surgery

## 2018-03-10 ENCOUNTER — Other Ambulatory Visit: Payer: Self-pay

## 2018-03-10 ENCOUNTER — Encounter: Payer: Self-pay | Admitting: Surgery

## 2018-03-10 ENCOUNTER — Ambulatory Visit (HOSPITAL_COMMUNITY)
Admission: RE | Admit: 2018-03-10 | Discharge: 2018-03-10 | Disposition: A | Discharge status: Home / Self Care | Payer: Medicare Other | Source: Ambulatory Visit | Attending: Surgery | Admitting: Surgery

## 2018-03-10 VITALS — BP 176/88 | HR 78 | Temp 98.7°F | Resp 20 | Ht 70.0 in | Wt 142.0 lb

## 2018-03-10 DIAGNOSIS — I701 Atherosclerosis of renal artery: Secondary | ICD-10-CM

## 2018-03-10 NOTE — Progress Notes (Signed)
[facility] [place] [facility]  Patient name: Tyler Carr MRN: [id number] DOB: [birth year] Sex: male   REASON FOR VISIT:    Follow up  HISOTRY OF PRESENT ILLNESS:    Tyler Carr is a 85 y.o. male who I saw as a hospital consult for malignant hypertension on 5 medications.  He underwent angiography on 02/04/2018 and had a left renal artery stent placed for a 90% stenosis.  His blood pressure has been better controlled.  His biggest complaint today is that of leg swelling   PAST MEDICAL HISTORY:   Past Medical History:  Diagnosis Date  . CHF (congestive heart failure) (HCC)   . Hyperlipidemia   . Hypertension      FAMILY HISTORY:   Family History  Problem Relation Age of Onset  . Stroke Mother     SOCIAL HISTORY:   Social History   Tobacco Use  . Smoking status: Never Smoker  . Smokeless tobacco: Never Used  Substance Use Topics  . Alcohol use: Not Currently     ALLERGIES:   No Known Allergies   CURRENT MEDICATIONS:   Current Outpatient Medications  Medication Sig Dispense Refill  . amLODipine (NORVASC) 5 MG tablet Take 1 tablet (5 mg total) by mouth daily. 30 tablet 0  . apixaban (ELIQUIS) 5 MG TABS tablet Take 5 mg by mouth 2 (two) times daily.    Marland Kitchen aspirin EC 81 MG EC tablet Take 1 tablet (81 mg total) by mouth daily. 30 tablet 2  . carvedilol (COREG) 25 MG tablet Take 12.5 mg by mouth 2 (two) times daily.    . furosemide (LASIX) 40 MG tablet Take 40 mg by mouth daily.    . hydrALAZINE (APRESOLINE) 50 MG tablet Take 1 tablet (50 mg total) by mouth every 8 (eight) hours. 90 tablet 0  . lisinopril (PRINIVIL,ZESTRIL) 40 MG tablet Take 40 mg by mouth daily.    . Melatonin 3 MG TABS Take 3-6 mg by mouth at bedtime as needed (sleep and agitation).    . simvastatin (ZOCOR) 40 MG tablet Take 20 mg by mouth at bedtime.    . tamsulosin (FLOMAX) 0.4 MG CAPS capsule Take 1 capsule (0.4 mg total) by mouth daily. 30  capsule 0   No current facility-administered medications for this visit.     REVIEW OF SYSTEMS:   [X]  denotes positive finding, [ ]  denotes negative finding Cardiac  Comments:  Chest pain or chest pressure:    Shortness of breath upon exertion:    Short of breath when lying flat:    Irregular heart rhythm:        Vascular    Pain in calf, thigh, or hip brought on by ambulation:    Pain in feet at night that wakes you up from your sleep:     Blood clot in your veins:    Leg swelling:  x       Pulmonary    Oxygen at home:    Productive cough:     Wheezing:         Neurologic    Sudden weakness in arms or legs:     Sudden numbness in arms or legs:     Sudden onset of difficulty speaking or slurred speech:    Temporary loss of vision in one eye:     Problems with dizziness:         Gastrointestinal    Blood in stool:     Vomited blood:  Genitourinary    Burning when urinating:     Blood in urine:        Psychiatric    Major depression:         Hematologic    Bleeding problems:    Problems with blood clotting too easily:        Skin    Rashes or ulcers:        Constitutional    Fever or chills:      PHYSICAL EXAM:   There were no vitals filed for this visit.  GENERAL: The patient is a well-nourished male, in no acute distress. The vital signs are documented above. CARDIAC: There is a regular rate and rhythm.  VASCULAR: No abdominal bruits PULMONARY: Non-labored respirations ABDOMEN: Soft and non-tender with normal pitched bowel sounds.  MUSCULOSKELETAL: There are no major deformities or cyanosis. NEUROLOGIC: No focal weakness or paresthesias are detected. SKIN: There are no ulcers or rashes noted. PSYCHIATRIC: The patient has a normal affect.  STUDIES:   I have reviewed his ultrasound with the following findings: Right: Cyst(s) noted. Evidence of a greater than 60% stenosis of the        right renal artery. Elevated velocities within the  right        renal artery indicating a >60% stenosis at the proximal        segment; however on 2D and doppler imaging appears widely        patent. Left:  Cyst(s) noted. Left renal stent not definately visualized.        Elevated velocities noted which may be indicative of a 50-99%        stenosis; however left renal artery is widley patent.     MEDICAL ISSUES:   Renal artery stenosis: The patient states that his blood pressure is better controlled and that he has come off of some of his medications.  Ultrasound today suggest right renal artery stenosis, however this was widely patent on his arteriogram last month so I doubt this is real.  The stent is widely patent so elevated velocities on this side are likely reflective of the stent.  He will follow-up in 6 months with a repeat ultrasound.  Leg swelling: The patient tells me he has an appointment to be fitted for compression stockings.  I also told him that he would need to follow-up with his primary physician for further evaluation of cardiac and renal issues as potential etiology for his leg swelling.  Durene Cal, MD [facility] [place] [phone] Pager [phone]

## 2018-05-09 ENCOUNTER — Other Ambulatory Visit: Payer: Self-pay

## 2018-05-09 ENCOUNTER — Encounter (HOSPITAL_COMMUNITY): Payer: Self-pay | Admitting: Emergency Medicine

## 2018-05-09 ENCOUNTER — Inpatient Hospital Stay (HOSPITAL_COMMUNITY)
Admission: EM | Admit: 2018-05-09 | Discharge: 2018-05-14 | DRG: 917 | Disposition: A | Discharge status: Home Health | Payer: Medicare Other | Attending: Internal Medicine | Admitting: Internal Medicine

## 2018-05-09 DIAGNOSIS — T39011A Poisoning by aspirin, accidental (unintentional), initial encounter: Secondary | ICD-10-CM | POA: Diagnosis present

## 2018-05-09 DIAGNOSIS — D631 Anemia in chronic kidney disease: Secondary | ICD-10-CM | POA: Diagnosis present

## 2018-05-09 DIAGNOSIS — J9601 Acute respiratory failure with hypoxia: Secondary | ICD-10-CM | POA: Diagnosis not present

## 2018-05-09 DIAGNOSIS — I495 Sick sinus syndrome: Secondary | ICD-10-CM | POA: Diagnosis present

## 2018-05-09 DIAGNOSIS — I4891 Unspecified atrial fibrillation: Secondary | ICD-10-CM | POA: Diagnosis not present

## 2018-05-09 DIAGNOSIS — R339 Retention of urine, unspecified: Secondary | ICD-10-CM | POA: Diagnosis present

## 2018-05-09 DIAGNOSIS — T45511A Poisoning by anticoagulants, accidental (unintentional), initial encounter: Secondary | ICD-10-CM | POA: Diagnosis present

## 2018-05-09 DIAGNOSIS — T501X1A Poisoning by loop [high-ceiling] diuretics, accidental (unintentional), initial encounter: Secondary | ICD-10-CM | POA: Diagnosis present

## 2018-05-09 DIAGNOSIS — N179 Acute kidney failure, unspecified: Secondary | ICD-10-CM | POA: Diagnosis not present

## 2018-05-09 DIAGNOSIS — Z7982 Long term (current) use of aspirin: Secondary | ICD-10-CM

## 2018-05-09 DIAGNOSIS — F0390 Unspecified dementia without behavioral disturbance: Secondary | ICD-10-CM | POA: Diagnosis not present

## 2018-05-09 DIAGNOSIS — R001 Bradycardia, unspecified: Secondary | ICD-10-CM | POA: Diagnosis not present

## 2018-05-09 DIAGNOSIS — E162 Hypoglycemia, unspecified: Secondary | ICD-10-CM | POA: Diagnosis not present

## 2018-05-09 DIAGNOSIS — I4821 Permanent atrial fibrillation: Secondary | ICD-10-CM | POA: Diagnosis present

## 2018-05-09 DIAGNOSIS — I130 Hypertensive heart and chronic kidney disease with heart failure and stage 1 through stage 4 chronic kidney disease, or unspecified chronic kidney disease: Secondary | ICD-10-CM | POA: Diagnosis present

## 2018-05-09 DIAGNOSIS — T50901A Poisoning by unspecified drugs, medicaments and biological substances, accidental (unintentional), initial encounter: Secondary | ICD-10-CM

## 2018-05-09 DIAGNOSIS — E872 Acidosis: Secondary | ICD-10-CM | POA: Diagnosis not present

## 2018-05-09 DIAGNOSIS — T446X1A Poisoning by alpha-adrenoreceptor antagonists, accidental (unintentional), initial encounter: Secondary | ICD-10-CM | POA: Diagnosis present

## 2018-05-09 DIAGNOSIS — F0150 Vascular dementia without behavioral disturbance: Secondary | ICD-10-CM

## 2018-05-09 DIAGNOSIS — R0602 Shortness of breath: Secondary | ICD-10-CM

## 2018-05-09 DIAGNOSIS — I469 Cardiac arrest, cause unspecified: Secondary | ICD-10-CM | POA: Diagnosis present

## 2018-05-09 DIAGNOSIS — Z823 Family history of stroke: Secondary | ICD-10-CM

## 2018-05-09 DIAGNOSIS — N183 Chronic kidney disease, stage 3 (moderate): Secondary | ICD-10-CM | POA: Diagnosis present

## 2018-05-09 DIAGNOSIS — I214 Non-ST elevation (NSTEMI) myocardial infarction: Secondary | ICD-10-CM | POA: Diagnosis not present

## 2018-05-09 DIAGNOSIS — T438X1A Poisoning by other psychotropic drugs, accidental (unintentional), initial encounter: Secondary | ICD-10-CM | POA: Diagnosis present

## 2018-05-09 DIAGNOSIS — E785 Hyperlipidemia, unspecified: Secondary | ICD-10-CM | POA: Diagnosis present

## 2018-05-09 DIAGNOSIS — J810 Acute pulmonary edema: Secondary | ICD-10-CM | POA: Diagnosis not present

## 2018-05-09 DIAGNOSIS — K7200 Acute and subacute hepatic failure without coma: Secondary | ICD-10-CM | POA: Diagnosis not present

## 2018-05-09 DIAGNOSIS — T465X1A Poisoning by other antihypertensive drugs, accidental (unintentional), initial encounter: Secondary | ICD-10-CM | POA: Diagnosis present

## 2018-05-09 DIAGNOSIS — I959 Hypotension, unspecified: Secondary | ICD-10-CM | POA: Diagnosis present

## 2018-05-09 DIAGNOSIS — I77810 Thoracic aortic ectasia: Secondary | ICD-10-CM | POA: Diagnosis present

## 2018-05-09 DIAGNOSIS — R34 Anuria and oliguria: Secondary | ICD-10-CM | POA: Diagnosis not present

## 2018-05-09 DIAGNOSIS — T447X1A Poisoning by beta-adrenoreceptor antagonists, accidental (unintentional), initial encounter: Secondary | ICD-10-CM | POA: Diagnosis present

## 2018-05-09 DIAGNOSIS — I083 Combined rheumatic disorders of mitral, aortic and tricuspid valves: Secondary | ICD-10-CM | POA: Diagnosis present

## 2018-05-09 DIAGNOSIS — Z9911 Dependence on respirator [ventilator] status: Secondary | ICD-10-CM | POA: Diagnosis not present

## 2018-05-09 DIAGNOSIS — E876 Hypokalemia: Secondary | ICD-10-CM | POA: Diagnosis present

## 2018-05-09 DIAGNOSIS — Z7901 Long term (current) use of anticoagulants: Secondary | ICD-10-CM | POA: Diagnosis not present

## 2018-05-09 DIAGNOSIS — I361 Nonrheumatic tricuspid (valve) insufficiency: Secondary | ICD-10-CM | POA: Diagnosis not present

## 2018-05-09 DIAGNOSIS — R0603 Acute respiratory distress: Secondary | ICD-10-CM

## 2018-05-09 DIAGNOSIS — T461X1A Poisoning by calcium-channel blockers, accidental (unintentional), initial encounter: Secondary | ICD-10-CM | POA: Diagnosis present

## 2018-05-09 DIAGNOSIS — I5043 Acute on chronic combined systolic (congestive) and diastolic (congestive) heart failure: Secondary | ICD-10-CM

## 2018-05-09 DIAGNOSIS — Z789 Other specified health status: Secondary | ICD-10-CM

## 2018-05-09 DIAGNOSIS — I340 Nonrheumatic mitral (valve) insufficiency: Secondary | ICD-10-CM | POA: Diagnosis not present

## 2018-05-09 DIAGNOSIS — Z4659 Encounter for fitting and adjustment of other gastrointestinal appliance and device: Secondary | ICD-10-CM

## 2018-05-09 HISTORY — DX: Unspecified dementia, unspecified severity, without behavioral disturbance, psychotic disturbance, mood disturbance, and anxiety: F03.90

## 2018-05-09 LAB — CBC WITH DIFFERENTIAL/PLATELET
Abs Immature Granulocytes: 0.01 10*3/uL (ref 0.00–0.07)
Basophils Absolute: 0 10*3/uL (ref 0.0–0.1)
Basophils Relative: 1 %
Eosinophils Absolute: 0.1 10*3/uL (ref 0.0–0.5)
Eosinophils Relative: 3 %
HCT: 33 % — ABNORMAL LOW (ref 39.0–52.0)
Hemoglobin: 10.9 g/dL — ABNORMAL LOW (ref 13.0–17.0)
Immature Granulocytes: 0 %
Lymphocytes Relative: 24 %
Lymphs Abs: 1.1 10*3/uL (ref 0.7–4.0)
MCH: 31.1 pg (ref 26.0–34.0)
MCHC: 33 g/dL (ref 30.0–36.0)
MCV: 94 fL (ref 80.0–100.0)
Monocytes Absolute: 0.5 10*3/uL (ref 0.1–1.0)
Monocytes Relative: 12 %
Neutro Abs: 2.7 10*3/uL (ref 1.7–7.7)
Neutrophils Relative %: 60 %
Platelets: 156 10*3/uL (ref 150–400)
RBC: 3.51 MIL/uL — ABNORMAL LOW (ref 4.22–5.81)
RDW: 14.8 % (ref 11.5–15.5)
WBC: 4.5 10*3/uL (ref 4.0–10.5)
nRBC: 0 % (ref 0.0–0.2)

## 2018-05-09 LAB — COMPREHENSIVE METABOLIC PANEL
ALT: 15 U/L (ref 0–44)
AST: 18 U/L (ref 15–41)
Albumin: 3.4 g/dL — ABNORMAL LOW (ref 3.5–5.0)
Alkaline Phosphatase: 49 U/L (ref 38–126)
Anion gap: 11 (ref 5–15)
BUN: 27 mg/dL — ABNORMAL HIGH (ref 8–23)
CO2: 23 mmol/L (ref 22–32)
Calcium: 9.3 mg/dL (ref 8.9–10.3)
Chloride: 110 mmol/L (ref 98–111)
Creatinine, Ser: 1.44 mg/dL — ABNORMAL HIGH (ref 0.61–1.24)
GFR calc Af Amer: 51 mL/min — ABNORMAL LOW (ref >60)
GFR calc non Af Amer: 44 mL/min — ABNORMAL LOW (ref >60)
Glucose, Bld: 101 mg/dL — ABNORMAL HIGH (ref 70–99)
Potassium: 3.6 mmol/L (ref 3.5–5.1)
Sodium: 144 mmol/L (ref 135–145)
Total Bilirubin: 0.8 mg/dL (ref 0.3–1.2)
Total Protein: 6.1 g/dL — ABNORMAL LOW (ref 6.5–8.1)

## 2018-05-09 LAB — SALICYLATE LEVEL: Salicylate Lvl: <7 mg/dL (ref 2.8–30.0)

## 2018-05-09 LAB — ACETAMINOPHEN LEVEL: Acetaminophen (Tylenol), Serum: <10 ug/mL — ABNORMAL LOW (ref 10–30)

## 2018-05-09 MED ORDER — ONDANSETRON HCL 4 MG/2ML IJ SOLN: 4.0000 mg | Freq: Four times a day (QID) | INTRAMUSCULAR | Status: DC | PRN

## 2018-05-09 MED ORDER — CHARCOAL ACTIVATED PO LIQD
50.0000 g | Freq: Once | ORAL | Status: AC
  Administered 2018-05-09: 14:00:00 50 g via ORAL
  Filled 2018-05-09: qty 240

## 2018-05-09 MED ORDER — SODIUM CHLORIDE 0.9 % IV SOLN
INTRAVENOUS | Status: DC
  Administered 2018-05-09 – 2018-05-10 (×2): via INTRAVENOUS

## 2018-05-09 MED ORDER — SODIUM CHLORIDE 0.9 % IV BOLUS
1000.0000 mL | Freq: Once | INTRAVENOUS | Status: AC
  Administered 2018-05-09: 16:00:00 1000 mL via INTRAVENOUS

## 2018-05-09 MED ORDER — SODIUM CHLORIDE 0.9 % IV BOLUS
1000.0000 mL | Freq: Once | INTRAVENOUS | Status: AC
  Administered 2018-05-09: 13:00:00 1000 mL via INTRAVENOUS

## 2018-05-09 MED ORDER — ONDANSETRON HCL 4 MG PO TABS: 4.0000 mg | ORAL_TABLET | Freq: Four times a day (QID) | ORAL | Status: DC | PRN

## 2018-05-09 NOTE — ED Notes (Signed)
ED TO INPATIENT HANDOFF REPORT  ED Nurse Name and Phone #: [phone]  S Name/Age/Gender Tyler GuardianMarvin Carr 85 y.o. male Room/Bed: RESUSC/RESUSC  Code Status   Code Status: Prior  Home/SNF/Other  Patient oriented to: self Is this baseline? Yes   Triage Complete: Triage complete  Chief Complaint Possible OD  Triage Note Pt BIB daughter due to taking meds -- 1 week of meds of his morning meds today at approx 1pm.   Has taken:  Lasix Hydralazine Eliquis Carvedilol Namenda Lisinopril Tamsulosin Amlodipine Baby ASA  Pt has a hx of dementia-- daughter is at bedside--   Allergies No Known Allergies  Level of Care/Admitting Diagnosis ED Disposition    ED Disposition Condition Comment   Admit  [facility] [[id number]]  Level of Care: Progressive [102]  Covid Evaluation: N/A  Diagnosis: Drug overdose, accidental or unintentional, initial encounter [[id number]][[id number]]  Admitting Physician: Rometta EmeryGARBA, MOHAMMAD L [[id number]]  Attending Physician: Rometta EmeryGARBA, MOHAMMAD L [[id number]]  Estimated length of stay: past midnight tomorrow  Certification:: I certify this patient will need inpatient services for at least 2 midnights  PT Class (Do Not Modify): Inpatient [101]  PT Acc Code (Do Not Modify): Private [1]       B Medical/Surgery History Past Medical History:  Diagnosis Date  . CHF (congestive heart failure) (HCC)   . Dementia (HCC)   . Hyperlipidemia   . Hypertension    Past Surgical History:  Procedure Laterality Date  . ABDOMINAL AORTOGRAM N/A 02/04/2018   Procedure: ABDOMINAL AORTOGRAM;  Surgeon: Nada LibmanBrabham, Vance W, MD;  Location: [facility] IN[facility]IVE [facility];  Service: Cardiovascular;  Laterality: N/A;  . HERNIA REPAIR    . PERIPHERAL VASCULAR INTERVENTION Left 02/04/2018   Procedure: PERIPHERAL VASCULAR INTERVENTION;  Surgeon: Nada LibmanBrabham, Vance W, MD;  Location: [facility]VAS[facility] CV LAB;  Service: Cardiovascular;  Laterality: Left;  Renal artery     A IV  Location/Drains/Wounds Patient Lines/Drains/Airways Status   Active Line/Drains/Airways    Name:   Placement date:   Placement time:   Site:   Days:   Peripheral IV 05/09/18 Left Forearm   05/09/18    1328    Forearm   less than 1          Intake/Output Last 24 hours  Intake/Output Summary (Last 24 hours) at 05/09/2018 1549 Last data filed at 05/09/2018 1513 Gross per 24 hour  Intake 1000 ml  Output -  Net 1000 ml    Labs/Imaging Results for orders placed or performed during the hospital encounter of 05/09/18 (from the past 48 hour(s))  Comprehensive metabolic panel     Status: Abnormal   Collection Time: 05/09/18  1:30 PM  Result Value Ref Range   Sodium 144 135 - 145 mmol/L   Potassium 3.6 3.5 - 5.1 mmol/L   Chloride 110 98 - 111 mmol/L   CO2 23 22 - 32 mmol/L   Glucose, Bld 101 (H) 70 - 99 mg/dL   BUN 27 (H) 8 - 23 mg/dL   Creatinine, Ser 6.211.44 (H) 0.61 - 1.24 mg/dL   Calcium 9.3 8.9 - 30.810.3 mg/dL   Total Protein 6.1 (L) 6.5 - 8.1 g/dL   Albumin 3.4 (L) 3.5 - 5.0 g/dL   AST 18 15 - 41 U/L   ALT 15 0 - 44 U/L   Alkaline Phosphatase 49 38 - 126 U/L   Total Bilirubin 0.8 0.3 - 1.2 mg/dL   GFR calc non Af Amer 44 (L) >60 mL/min   GFR  calc Af Amer 51 (L) >60 mL/min   Anion gap 11 5 - 15    Comment: Performed at [facility], [address], [place], [place] [address]  Acetaminophen level     Status: Abnormal   Collection Time: 05/09/18  1:30 PM  Result Value Ref Range   Acetaminophen (Tylenol), Serum <10 (L) 10 - 30 ug/mL    Comment: (NOTE) Therapeutic concentrations vary significantly. A range of 10-30 ug/mL  may be an effective concentration for many patients. However, some  are best treated at concentrations outside of this range. Acetaminophen concentrations >150 ug/mL at 4 hours after ingestion  and >50 ug/mL at 12 hours after ingestion are often associated with  toxic reactions. Performed at [facility], [address] [place], [place] [address]    Salicylate level     Status: None   Collection Time: 05/09/18  1:30 PM  Result Value Ref Range   Salicylate Lvl <7.0 2.8 - 30.0 mg/dL    Comment: Performed at [facility], [address], [place], [place] [address]  CBC with Differential     Status: Abnormal   Collection Time: 05/09/18  1:30 PM  Result Value Ref Range   WBC 4.5 4.0 - 10.5 K/uL   RBC 3.51 (L) 4.22 - 5.81 MIL/uL   Hemoglobin 10.9 (L) 13.0 - 17.0 g/dL   HCT 81.4 (L) 48.1 - 85.6 %   MCV 94.0 80.0 - 100.0 fL   MCH 31.1 26.0 - 34.0 pg   MCHC 33.0 30.0 - 36.0 g/dL   RDW 31.4 97.0 - 26.3 %   Platelets 156 150 - 400 K/uL   nRBC 0.0 0.0 - 0.2 %   Neutrophils Relative % 60 %   Neutro Abs 2.7 1.7 - 7.7 K/uL   Lymphocytes Relative 24 %   Lymphs Abs 1.1 0.7 - 4.0 K/uL   Monocytes Relative 12 %   Monocytes Absolute 0.5 0.1 - 1.0 K/uL   Eosinophils Relative 3 %   Eosinophils Absolute 0.1 0.0 - 0.5 K/uL   Basophils Relative 1 %   Basophils Absolute 0.0 0.0 - 0.1 K/uL   Immature Granulocytes 0 %   Abs Immature Granulocytes 0.01 0.00 - 0.07 K/uL    Comment: Performed at [facility], [address] [place], [place] [address]   No results found.  Pending Labs [employer] (From admission, onward)    Start     Ordered   Signed and Held  Comprehensive metabolic panel  Tomorrow morning,   R     Signed and Held   Signed and Held  CBC  Tomorrow morning,   R     Signed and Held          Vitals/Pain Today's Vitals   05/09/18 1430 05/09/18 1500 05/09/18 1530 05/09/18 1535  BP: (!) 105/52 (!) 100/55 (!) 83/44 (!) 98/45  Pulse: (!) 50 (!) 46 (!) 53 (!) 43  Resp: 19 12 11 13   Temp:      TempSrc:      SpO2: 94% 93% 93% 93%  Weight:      Height:      PainSc:        Isolation Precautions No active isolations  Medications Medications  sodium chloride 0.9 % bolus 1,000 mL (0 mLs Intravenous Stopped 05/09/18 1513)  charcoal activated (NO SORBITOL) (ACTIDOSE-AQUA) suspension 50 g (50 g Oral Given 05/09/18  1332)  sodium chloride 0.9 % bolus 1,000 mL (  1,000 mLs Intravenous New Bag/Given 05/09/18 1549)    Mobility walks with device High fall risk   Focused Assessments   R Recommendations: See Admitting Provider Note  Report given to:   Additional Notes:

## 2018-05-09 NOTE — ED Provider Notes (Signed)
[facility] Provider Note   CSN: [id number] Arrival date & time: 05/09/18  1304    History   Chief Complaint Chief Complaint  Patient presents with  . possible overdose    HPI Tyler Carr is a 85 y.o. male.     HPI Level 5 caveat due to dementia. Patient presents after an accidental medication overdose.  His wife and just refilled all his morning medicines in the week the container.  He took all of them.  History of dementia and family members do not think it was intentional or hurt himself.  There was likely 7 of each of the pills and there.  His morning medicines are hydralazine 50 mg, Eliquis 5 mg, Coreg 12.5 mg.  Norvasc 5 mg, Flomax 0.4 mg.  Lasix 40 mg, Namenda 20 mg, aspirin 81 mg.  Discussed with poison control.  Will need at least 12 hours of monitoring.  Will give activated charcoal if patient will tolerate it.  Patient is awake and without complaints but does have a mild bradycardia and hypotension. Past Medical History:  Diagnosis Date  . CHF (congestive heart failure) (HCC)   . Dementia (HCC)   . Hyperlipidemia   . Hypertension     Patient Active Problem List   Diagnosis Date Noted  . Accelerated hypertension   . Symptomatic bradycardia 01/30/2018  . CHF (congestive heart failure) (HCC)   . Hypertension   . Hyperlipidemia   . Atrial fibrillation with slow ventricular response [facility](HCC)     Past Surgical History:  Procedure Laterality Date  . ABDOMINAL AORTOGRAM N/A 02/04/2018   Procedure: ABDOMINAL AORTOGRAM;  Surgeon: Nada LibmanBrabham, Vance W, MD;  [facility]: [facility];  Service: Cardiovascular;  Laterality: N/A;  . HERNIA REPAIR    . PERIPHERAL VASCULAR INTERVENTION Left 02/04/2018   Procedure: PERIPHERAL VASCULAR INTERVENTION;  Surgeon: Nada LibmanBrabham, Vance W, MD;  Location: [facility];  Service: Cardiovascular;  Laterality: Left;  Renal artery        Home Medications    Prior to Admission medications    Medication Sig Start Date End Date Taking? Authorizing Provider  amLODipine (NORVASC) 5 MG tablet Take 1 tablet (5 mg total) by mouth daily. 02/06/18   Mikhail, Nita SellsMaryann, DO  apixaban (ELIQUIS) 5 MG TABS tablet Take 5 mg by mouth 2 (two) times daily.    [provider]  aspirin EC 81 MG EC tablet Take 1 tablet (81 mg total) by mouth daily. 02/06/18   Mikhail, Nita SellsMaryann, DO  carvedilol (COREG) 25 MG tablet Take 12.5 mg by mouth 2 (two) times daily.    [provider]  furosemide (LASIX) 40 MG tablet Take 40 mg by mouth daily.    [provider]  hydrALAZINE (APRESOLINE) 50 MG tablet Take 1 tablet (50 mg total) by mouth every 8 (eight) hours. 02/05/18   Mikhail, Nita SellsMaryann, DO  lisinopril (PRINIVIL,ZESTRIL) 40 MG tablet Take 40 mg by mouth daily.    [provider]  Melatonin 3 MG TABS Take 3-6 mg by mouth at bedtime as needed (sleep and agitation).    [provider]  simvastatin (ZOCOR) 40 MG tablet Take 20 mg by mouth at bedtime.    [provider]  tamsulosin (FLOMAX) 0.4 MG CAPS capsule Take 1 capsule (0.4 mg total) by mouth daily. 02/06/18   Edsel PetrinMikhail, Maryann, DO    Family History Family History  Problem Relation Age of Onset  . Stroke Mother     Social History  Social History   Tobacco Use  . Smoking status: Never Smoker  . Smokeless tobacco: Never Used  Substance Use Topics  . Alcohol use: Not Currently  . Drug use: Not Currently     Allergies   Patient has no known allergies.   Review of Systems Review of Systems   Physical Exam Updated Vital Signs BP (!) 125/56   Pulse (!) 55   Temp 98 F (36.7 C) (Oral)   Resp 15   Ht  (1.803 m)   Wt 64.4 kg   SpO2 96%   BMI 19.80 kg/m   Physical Exam   ED Treatments / Results  Labs (all labs ordered are listed, but only abnormal results are displayed) Labs Reviewed  COMPREHENSIVE METABOLIC PANEL - Abnormal; Notable for the following components:      Result Value    Glucose, Bld 101 (*)    BUN 27 (*)    Creatinine, Ser 1.44 (*)    Total Protein 6.1 (*)    Albumin 3.4 (*)    GFR calc non Af Amer 44 (*)    GFR calc Af Amer 51 (*)    All other components within normal limits  ACETAMINOPHEN LEVEL - Abnormal; Notable for the following components:   Acetaminophen (Tylenol), Serum <10 (*)    All other components within normal limits  CBC WITH DIFFERENTIAL/PLATELET - Abnormal; Notable for the following components:   RBC 3.51 (*)    Hemoglobin 10.9 (*)    HCT 33.0 (*)    All other components within normal limits  SALICYLATE LEVEL    EKG EKG Interpretation  Date/Time:  Friday May 09 2018 13:15:44 EDT Ventricular Rate:  51 PR Interval:    QRS Duration: 123 QT Interval:  494 QTC Calculation: 455 R Axis:   107 Text Interpretation:  Unknown rhythm, irregular rate Nonspecific intraventricular conduction delay Anterior infarct, old Borderline repolarization abnormality Confirmed by Benjiman Core [phone]) on 05/09/2018 1:27:58 PM   Radiology No results found.  Procedures Procedures (including critical care time)  Medications Ordered in ED Medications  sodium chloride 0.9 % bolus 1,000 mL (1,000 mLs Intravenous New Bag/Given 05/09/18 1329)  charcoal activated (NO SORBITOL) (ACTIDOSE-AQUA) suspension 50 g (50 g Oral Given 05/09/18 1332)     Initial Impression / Assessment and Plan / ED Course  I have reviewed the triage vital signs and the nursing notes.  Pertinent labs & imaging results that were available during my care of the patient were reviewed by me and considered in my medical decision making (see chart for details).       Patient with accidental overdose of medications.  Discussed with poison control.  Needs at least 12 hours of monitoring.  Has had a bradycardia that appears to be somewhat chronic.  Has not wanted a pacemaker when one was suggested around 3 years ago but discussed with the patient's wife and she would want a  temporary pacer if needed.  Further intervention such as intubation she would want to be discussed.  Blood pressures improved somewhat here.  Has been given activated charcoal.  However with the patient's history of bradycardia at baseline mild hypotension to start with and the fact that require least 12 hours of monitoring I feel patient benefit from observation in the hospital.  Will discuss with admitting team.  Final Clinical Impressions(s) / ED Diagnoses   Final diagnoses:  Accidental overdose, initial encounter    ED Discharge Orders    None  Benjiman Core, MD 05/09/18 1436

## 2018-05-09 NOTE — Progress Notes (Signed)
Received call from Dr. Carron Curie, per patient's wife it is ok to provide information to him.    Dr. Sharyon Medicus wanted to know on the progression of Tyler Carr.  Stated that VS were done, pressure are going up slowly, he is awake and alert to self, not sure if this is his baseline but does have hx. of dementia.  Doctor stated that he is friend of the family and wanted to know a little bit more to relay information to family.

## 2018-05-09 NOTE — ED Notes (Signed)
Attempted report 

## 2018-05-09 NOTE — ED Triage Notes (Signed)
Pt BIB daughter due to taking meds -- 1 week of meds of his morning meds today at approx 1pm.   Has taken:  Lasix Hydralazine Eliquis Carvedilol Namenda Lisinopril Tamsulosin Amlodipine Baby ASA  Pt has a hx of dementia-- daughter is at bedside--

## 2018-05-09 NOTE — ED Notes (Signed)
[phone] [facility] DAUGHTER

## 2018-05-09 NOTE — ED Notes (Signed)
Pt will need a Recruitment consultant for admission

## 2018-05-09 NOTE — Progress Notes (Addendum)
Received call from [employer] from [employer] health [facility] Control), asked about patient's VS, labs, nutrition, output, and fluid intake.  Stated to Duwayne Heck that there is no BM at this time or output that was noted in day shift but will follow up with a bladder scan if we don't get any output later on.  Let her know that patient is alert to self not sure if this is baseline but has history of dementia.  Danielle or someone will follow up in the am.

## 2018-05-09 NOTE — ED Notes (Signed)
Nurse Navigator Daughter remains at bedside due to AMS/Demntia and she is the historian. She has been kept informed by the primary nurse Anell Barr.

## 2018-05-09 NOTE — H&P (Signed)
History and Physical   Tyler Carr ZOX:[id number] DOB: 10-15-1930 DOA: 05/09/2018  Referring MD/NP/PA: Dr. Rubin PayorPickering  PCP: Patient, No Pcp Per   Outpatient Specialists:   Nada LibmanBrabham, Vance W, MD  Vascular Surgery    Patient coming from: Home  Chief Complaint: Inadvertent overdose  HPI: Tyler Carr is a 85 y.o. male with medical history significant of congestive heart failure, Renal artery stenosis, hypertension, chronic bradycardia, hyperlipidemia, mild memory impairment who was brought in by daughter secondary to drug overdose.  Patient has a pillbox that is being set up by his wife on a regular basis.  This morning his wife was away from the medications while setting it out on the table.  Patient mistakenly took a whole week's supply of his medication at once.  He is mainly on cardiovascular medications including hydralazine, Eliquis, Coreg, Norvasc, Flomax, Lasix and Namenda with aspirin.  Patient is currently awake and alert communicating but weak.  His blood pressure was in the 80s on arrival.  Poison control has been involved.  At this point he is being admitted for supportive care.  He is noted to have profound bradycardia with rates in the 20s.  He has previously refused a pacemaker.  Patient and daughter however currently interested in pacemaker if recommended..  ED Course: Temperature 98 blood pressure 83/44 pulse 43 respiratory rate of 19 oxygen sat 93% room air.  Chemistry appears to be stable except for creatinine of 1.44 BUN 27.  Albumin 3.4.  Hemoglobin is 10.9 and CBC is otherwise normal.  EKG showed irregular rhythm.  The rate of 51.  Review of Systems: As per HPI otherwise 10 point review of systems negative.    Past Medical History:  Diagnosis Date  . CHF (congestive heart failure) (HCC)   . Dementia (HCC)   . Hyperlipidemia   . Hypertension     Past Surgical History:  Procedure Laterality Date  . ABDOMINAL AORTOGRAM N/A 02/04/2018   Procedure: ABDOMINAL AORTOGRAM;   Surgeon: Nada LibmanBrabham, Vance W, MD;  Location: [facility];  Service: Cardiovascular;  Laterality: N/A;  . HERNIA REPAIR    . PERIPHERAL VASCULAR INTERVENTION Left 02/04/2018   Procedure: PERIPHERAL VASCULAR INTERVENTION;  Surgeon: Nada LibmanBrabham, Vance W, MD;  Location: [facility];  Service: Cardiovascular;  Laterality: Left;  Renal artery     reports that he has never smoked. He has never used smokeless tobacco. He reports previous alcohol use. He reports previous drug use.  No Known Allergies  Family History  Problem Relation Age of Onset  . Stroke Mother      Prior to Admission medications   Medication Sig Start Date End Date Taking? Authorizing Provider  amLODipine (NORVASC) 5 MG tablet Take 1 tablet (5 mg total) by mouth daily. 02/06/18   Mikhail, Nita SellsMaryann, DO  apixaban (ELIQUIS) 5 MG TABS tablet Take 5 mg by mouth 2 (two) times daily.    [provider]  aspirin EC 81 MG EC tablet Take 1 tablet (81 mg total) by mouth daily. 02/06/18   Mikhail, Nita SellsMaryann, DO  carvedilol (COREG) 25 MG tablet Take 12.5 mg by mouth 2 (two) times daily.    [provider]  furosemide (LASIX) 40 MG tablet Take 40 mg by mouth daily.    [provider]  hydrALAZINE (APRESOLINE) 50 MG tablet Take 1 tablet (50 mg total) by mouth every 8 (eight) hours. 02/05/18   Mikhail, Nita SellsMaryann, DO  lisinopril (PRINIVIL,ZESTRIL) 40 MG tablet Take 40 mg by mouth daily.  [provider]  Melatonin 3 MG TABS Take 3-6 mg by mouth at bedtime as needed (sleep and agitation).    [provider]  simvastatin (ZOCOR) 40 MG tablet Take 20 mg by mouth at bedtime.    [provider]  tamsulosin (FLOMAX) 0.4 MG CAPS capsule Take 1 capsule (0.4 mg total) by mouth daily. 02/06/18   Edsel Petrin, DO    Physical Exam: Vitals:   05/09/18 1314 05/09/18 1317 05/09/18 1345 05/09/18 1400  BP:  (!) 97/51 (!) 96/57 (!) 125/56  Pulse:  (!) 52 (!) 58 (!) 55  Resp:  14 17 15   Temp:  98  F (36.7 C)    TempSrc:  Oral    SpO2:  98% 96% 96%  Weight: 64.4 kg     Height: 5\' 11"  (1.803 m)         Constitutional: NAD, calm, comfortable, slightly drowsy Vitals:   05/09/18 1314 05/09/18 1317 05/09/18 1345 05/09/18 1400  BP:  (!) 97/51 (!) 96/57 (!) 125/56  Pulse:  (!) 52 (!) 58 (!) 55  Resp:  14 17 15   Temp:  98 F (36.7 C)    TempSrc:  Oral    SpO2:  98% 96% 96%  Weight: 64.4 kg     Height: 5\' 11"  (1.803 m)      Eyes: PERRL, lids and conjunctivae normal ENMT: Mucous membranes are moist. Posterior pharynx clear of any exudate or lesions.Normal dentition.  Neck: normal, supple, no masses, no thyromegaly Respiratory: clear to auscultation bilaterally, no wheezing, no crackles. Normal respiratory effort. No accessory muscle use.  Cardiovascular: Irregularly irregular with bradycardia, no murmurs / rubs / gallops. No extremity edema. 2+ pedal pulses. No carotid bruits.  Abdomen: no tenderness, no masses palpated. No hepatosplenomegaly. Bowel sounds positive.  Musculoskeletal: no clubbing / cyanosis. No joint deformity upper and lower extremities. Good ROM, no contractures. Normal muscle tone.  Skin: no rashes, lesions, ulcers. No induration Neurologic: CN 2-12 grossly intact. Sensation intact, DTR normal. Strength 5/5 in all 4.  Psychiatric: Dementia but talking appropriately, no agitation.     Labs on Admission: I have personally reviewed following labs and imaging studies  CBC: Recent Labs  Lab 05/09/18 1330  WBC 4.5  NEUTROABS 2.7  HGB 10.9*  HCT 33.0*  MCV 94.0  PLT 156   Basic Metabolic Panel: Recent Labs  Lab 05/09/18 1330  NA 144  K 3.6  CL 110  CO2 23  GLUCOSE 101*  BUN 27*  CREATININE 1.44*  CALCIUM 9.3   GFR: Estimated Creatinine Clearance: 34.2 mL/min (A) (by C-G formula based on SCr of 1.44 mg/dL (H)). Liver Function Tests: Recent Labs  Lab 05/09/18 1330  AST 18  ALT 15  ALKPHOS 49  BILITOT 0.8  PROT 6.1*  ALBUMIN 3.4*   No  results for input(s): LIPASE, AMYLASE in the last 168 hours. No results for input(s): AMMONIA in the last 168 hours. Coagulation Profile: No results for input(s): INR, PROTIME in the last 168 hours. Cardiac Enzymes: No results for input(s): CKTOTAL, CKMB, CKMBINDEX, TROPONINI in the last 168 hours. BNP (last 3 results) No results for input(s): PROBNP in the last 8760 hours. HbA1C: No results for input(s): HGBA1C in the last 72 hours. CBG: No results for input(s): GLUCAP in the last 168 hours. Lipid Profile: No results for input(s): CHOL, HDL, LDLCALC, TRIG, CHOLHDL, LDLDIRECT in the last 72 hours. Thyroid Function Tests: No results for input(s): TSH, T4TOTAL, FREET4, T3FREE, THYROIDAB in the last  72 hours. Anemia Panel: No results for input(s): VITAMINB12, FOLATE, FERRITIN, TIBC, IRON, RETICCTPCT in the last 72 hours. Urine analysis:    Component Value Date/Time   COLORURINE YELLOW 01/30/2018 1746   APPEARANCEUR CLEAR 01/30/2018 1746   LABSPEC 1.016 01/30/2018 1746   PHURINE 6.0 01/30/2018 1746   GLUCOSEU NEGATIVE 01/30/2018 1746   HGBUR NEGATIVE 01/30/2018 1746   BILIRUBINUR NEGATIVE 01/30/2018 1746   KETONESUR NEGATIVE 01/30/2018 1746   PROTEINUR NEGATIVE 01/30/2018 1746   NITRITE NEGATIVE 01/30/2018 1746   LEUKOCYTESUR NEGATIVE 01/30/2018 1746   Sepsis Labs: (procalcitonin:4,lacticidven:4) )No results found for this or any previous visit (from the past 240 hour(s)).   Radiological Exams on Admission: No results found.  EKG: Independently reviewed.  It showed atrial fibrillation with a rate of 51, no significant changes.  No change from previous.  Assessment/Plan Principal Problem:   Drug overdose, accidental or unintentional, initial encounter Active Problems:   Hyperlipidemia   Symptomatic bradycardia   Atrial fibrillation with slow ventricular response (HCC)   Hypotension     #1 unintentional drug overdose: Patient made a mistake secondary to his  dementia.  At this point we are worried about his vital signs.  Is getting fluid boluses.  Blood pressure has started to stabilize.  We will admit him to stepdown unit.  Temporary pacemaker in place.  Monitoring patient's blood pressure.  Hold all medications for now.  Patient may have to have a sitter in place.  #2 hypotension: Secondary to unintentional overdose.  Again continue as above.  #3 symptomatic bradycardia: Patient was offered pacemaker before but he declined.  He is more acceptable to a right now.  Patient however just overdose on Coreg and Norvasc.  We will wait till these medications have cleared his system and see where his heart rate is and then a decision will be made.  #4 atrial fibrillation: Patient has been on Eliquis with proper rate control.  With overdose we will hold Eliquis for now.  If no bleeding in the next 24 hours will resume.  #5 hyperlipidemia: Again we are holding his statins.  #6 dementia: Patient's dementia may have led to his current overdose.  Discussed with daughter and wife.  Medication safety will have to be reviewed at discharge.  We may need a sitter if patient becomes more agitated.  DVT prophylaxis: SCD Code Status: Full code Family Communication: Discussed with daughter who is at bedside Disposition Plan: To be determined Consults called: None Admission status: Inpatient to progressive care  Severity of Illness: The appropriate patient status for this patient is INPATIENT. Inpatient status is judged to be reasonable and necessary in order to provide the required intensity of service to ensure the patient's safety. The patient's presenting symptoms, physical exam findings, and initial radiographic and laboratory data in the context of their chronic comorbidities is felt to place them at high risk for further clinical deterioration. Furthermore, it is not anticipated that the patient will be medically stable for discharge from the hospital within 2  midnights of admission. The following factors support the patient status of inpatient.   " The patient's presenting symptoms include drug overdose. " The worrisome physical exam findings include persistent bradycardia and hypotension. " The initial radiographic and laboratory data are worrisome because of normal lab work. " The chronic co-morbidities include hypertension and renal artery stenosis.   * I certify that at the point of admission it is my clinical judgment that the patient will require inpatient hospital care spanning  beyond 2 midnights from the point of admission due to high intensity of service, high risk for further deterioration and high frequency of surveillance required.Lonia Blood MD [facility] Pager [phone]  If 7PM-7AM, please contact night-coverage [email or web address] Password [facility]  05/09/2018, 3:15 PM

## 2018-05-10 ENCOUNTER — Inpatient Hospital Stay (HOSPITAL_COMMUNITY): Payer: Medicare Other

## 2018-05-10 DIAGNOSIS — R001 Bradycardia, unspecified: Secondary | ICD-10-CM

## 2018-05-10 DIAGNOSIS — I959 Hypotension, unspecified: Secondary | ICD-10-CM

## 2018-05-10 DIAGNOSIS — E785 Hyperlipidemia, unspecified: Secondary | ICD-10-CM

## 2018-05-10 LAB — BLOOD GAS, ARTERIAL
Acid-base deficit: 7.2 mmol/L — ABNORMAL HIGH (ref 0.0–2.0)
Bicarbonate: 19.3 mmol/L — ABNORMAL LOW (ref 20.0–28.0)
Drawn by: 44166
O2 Content: 6 L/min
O2 Saturation: 78.9 %
Patient temperature: 98.6
pCO2 arterial: 49.8 mmHg — ABNORMAL HIGH (ref 32.0–48.0)
pH, Arterial: 7.213 — ABNORMAL LOW (ref 7.350–7.450)
pO2, Arterial: 56.7 mmHg — ABNORMAL LOW (ref 83.0–108.0)

## 2018-05-10 LAB — COMPREHENSIVE METABOLIC PANEL
ALT: 21 U/L (ref 0–44)
AST: 24 U/L (ref 15–41)
Albumin: 3.7 g/dL (ref 3.5–5.0)
Alkaline Phosphatase: 56 U/L (ref 38–126)
Anion gap: 11 (ref 5–15)
BUN: 25 mg/dL — ABNORMAL HIGH (ref 8–23)
CO2: 21 mmol/L — ABNORMAL LOW (ref 22–32)
Calcium: 9.2 mg/dL (ref 8.9–10.3)
Chloride: 109 mmol/L (ref 98–111)
Creatinine, Ser: 1.14 mg/dL (ref 0.61–1.24)
GFR calc Af Amer: >60 mL/min (ref >60)
GFR calc non Af Amer: 58 mL/min — ABNORMAL LOW (ref >60)
Glucose, Bld: 136 mg/dL — ABNORMAL HIGH (ref 70–99)
Potassium: 2.9 mmol/L — ABNORMAL LOW (ref 3.5–5.1)
Sodium: 141 mmol/L (ref 135–145)
Total Bilirubin: 0.7 mg/dL (ref 0.3–1.2)
Total Protein: 6.7 g/dL (ref 6.5–8.1)

## 2018-05-10 LAB — URINALYSIS, ROUTINE W REFLEX MICROSCOPIC
Bilirubin Urine: NEGATIVE
Glucose, UA: NEGATIVE mg/dL
Hgb urine dipstick: NEGATIVE
Ketones, ur: NEGATIVE mg/dL
Leukocytes,Ua: NEGATIVE
Nitrite: NEGATIVE
Protein, ur: NEGATIVE mg/dL
Specific Gravity, Urine: 1.012 (ref 1.005–1.030)
pH: 5 (ref 5.0–8.0)

## 2018-05-10 LAB — CBC
HCT: 34.9 % — ABNORMAL LOW (ref 39.0–52.0)
Hemoglobin: 11.4 g/dL — ABNORMAL LOW (ref 13.0–17.0)
MCH: 30.1 pg (ref 26.0–34.0)
MCHC: 32.7 g/dL (ref 30.0–36.0)
MCV: 92.1 fL (ref 80.0–100.0)
Platelets: 180 10*3/uL (ref 150–400)
RBC: 3.79 MIL/uL — ABNORMAL LOW (ref 4.22–5.81)
RDW: 14.9 % (ref 11.5–15.5)
WBC: 6.5 10*3/uL (ref 4.0–10.5)
nRBC: 0 % (ref 0.0–0.2)

## 2018-05-10 LAB — POTASSIUM: Potassium: 3.4 mmol/L — ABNORMAL LOW (ref 3.5–5.1)

## 2018-05-10 MED ORDER — FUROSEMIDE 10 MG/ML IJ SOLN
40.0000 mg | Freq: Once | INTRAMUSCULAR | Status: AC
  Administered 2018-05-10: 23:00:00 40 mg via INTRAVENOUS
  Filled 2018-05-10: qty 4

## 2018-05-10 MED ORDER — HYDRALAZINE HCL 20 MG/ML IJ SOLN
5.0000 mg | Freq: Four times a day (QID) | INTRAMUSCULAR | Status: DC | PRN
  Filled 2018-05-10: qty 1

## 2018-05-10 MED ORDER — POTASSIUM CHLORIDE CRYS ER 20 MEQ PO TBCR
40.0000 meq | EXTENDED_RELEASE_TABLET | Freq: Two times a day (BID) | ORAL | Status: DC
  Administered 2018-05-10: 40 meq via ORAL
  Filled 2018-05-10 (×2): qty 2

## 2018-05-10 MED ORDER — APIXABAN 5 MG PO TABS
5.0000 mg | ORAL_TABLET | Freq: Two times a day (BID) | ORAL | Status: DC
  Administered 2018-05-10: 15:00:00 5 mg via ORAL
  Filled 2018-05-10 (×2): qty 1

## 2018-05-10 MED ORDER — ALPRAZOLAM 0.25 MG PO TABS
0.2500 mg | ORAL_TABLET | Freq: Once | ORAL | Status: AC | PRN
  Administered 2018-05-10: 23:00:00 0.25 mg via ORAL
  Filled 2018-05-10: qty 1

## 2018-05-10 MED ORDER — TAMSULOSIN HCL 0.4 MG PO CAPS
0.4000 mg | ORAL_CAPSULE | Freq: Every day | ORAL | Status: DC
  Administered 2018-05-10 – 2018-05-13 (×2): 0.4 mg via ORAL
  Filled 2018-05-10 (×2): qty 1

## 2018-05-10 NOTE — Significant Event (Signed)
Rapid Response Event Note  Overview:Called d/t SpO2-76% on 2L [place].  RN and RT increased FiO2 to 6L [place] with SpO2 increasing to 88-90%. Time Called: 2230 Arrival Time: 2235 Event Type: Respiratory  Initial Focused Assessment: Pt laying in bed, agitated, picking at lines/leads/gown, etc. Pt alert to self only, however confusion/dementia is baseline. T-97.5, HR-100S Afib, BP-unable to obtain d/t pt mvmt -160/96 earlier, RR-30s, SpO2-88% on 6L [place]. Pt in mild distress. Lungs with crackles t/o.   Interventions: ABG-7.21/49/56/19 PCXR-Cardiomegaly, vascular congestion. Left infrahilar airspace disease could reflect asymmetric edema or infection. Small effulsions. 40mg  lasix given per order. 0.25mg  xanax given per order.  2345-pt SpO2-94% on 6LNC, pt resting.   Plan of Care (if not transferred): Continue to monitor pt, call RRT if further assistance needed. Event Summary:  Bruna Potter, NP   at   2255     at          [place], [place]

## 2018-05-10 NOTE — Progress Notes (Signed)
RAPID RESPONSE RT NOTE:  On arrival noted patient appearing to be uncomfortable and squirming all over the bed, learning from primary RN that patient baseline is dementia. Hypoxia noted. SATs in the 70's on my arrival on 3L [place]. BBS to auscultation reveal Coarse Crackles throughout, suggesting pul vascular congestion/fluids in the lungs. ABG pending. Rapid Response RN notified.

## 2018-05-10 NOTE — Progress Notes (Signed)
Patient having sob and using accessory muscles, placed on O2 4L.  BP going up at 171/78.  I will keep monitoring patient.

## 2018-05-10 NOTE — Progress Notes (Signed)
Patient pulled out IV tonight, patient is disoriented and agitated, wanting to leave the room.  After, I explained that he was in the hospital and his daughter had brought him to the hospital, he was a little relax and understanding.  I will keep monitoring patient.

## 2018-05-10 NOTE — Progress Notes (Signed)
ANTICOAGULATION CONSULT NOTE - Initial Consult  Pharmacy Consult for Eliquis Indication: atrial fibrillation  No Known Allergies  Patient Measurements: Height: 5\' 11"  (180.3 cm) Weight: 142 lb (64.4 kg) IBW/kg (Calculated) : 75.3   Vital Signs: Temp: 97.1 F (36.2 C) (04/25 1229) Temp Source: Axillary (04/25 1229) BP: 145/78 (04/25 1355) Pulse Rate: 85 (04/25 1229)  Labs: Recent Labs    05/09/18 1330 05/10/18 0447  HGB 10.9* 11.4*  HCT 33.0* 34.9*  PLT 156 180  CREATININE 1.44* 1.14    Estimated Creatinine Clearance: 43.2 mL/min (by C-G formula based on SCr of 1.14 mg/dL).   Medical History: Past Medical History:  Diagnosis Date  . CHF (congestive heart failure) (HCC)   . Dementia (HCC)   . Hyperlipidemia   . Hypertension     Medications:  Scheduled:  . potassium chloride  40 mEq Oral BID    Assessment: 85 yo male on chronic Eliquis.  This was held upon admission, but pharmacy asked to resume today.  Pt age > 80, but weight is > 60 kg, and Scr 1.1.  Goal of Therapy:  Monitor platelets by anticoagulation protocol: Yes   Plan:  Resume Eliquis 5 mg BID.  Jenetta Downer, [facility] Clinical Pharmacist Phone [phone]  05/10/2018 2:22 PM

## 2018-05-10 NOTE — Progress Notes (Addendum)
PROGRESS NOTE    Tyler Carr  ZOX:[id number] DOB: 04-16-1930 DOA: 05/09/2018 PCP: Patient, No Pcp Per    Brief Narrative:  Tyler Carr is a 85 y.o. male with medical history significant of congestive heart failure, Renal artery stenosis, hypertension, chronic bradycardia, hyperlipidemia, mild memory impairment who was brought in by daughter secondary to drug overdose.  Patient has a pillbox that is being set up by his wife on a regular basis.  This morning his wife was away from the medications while setting it out on the table.  Patient mistakenly took a whole week's supply of his medication at once.  He is mainly on cardiovascular medications including hydralazine, Eliquis, Coreg, Norvasc, Flomax, Lasix and Namenda with aspirin.  Patient is currently awake and alert communicating    Assessment & Plan:   Principal Problem:   Drug overdose, accidental or unintentional, initial encounter Active Problems:   Hyperlipidemia   Symptomatic bradycardia   Atrial fibrillation with slow ventricular response (HCC)   Hypotension   Accidental drug overdose Monitor the patient in stepdown unit.  Patient is alert and answering simple questions at this time.   Hypotension probably secondary to the overdose of antihypertensive medications. Resolved   Symptomatic bradycardia Heart rate has improved and it is in the 50s to 60s per minute.  Holding his home medications at this time.    Chronic atrial fibrillation Rate better and patient is on Eliquis will resume it today.   Hyperlipidemia   Hypokalemia replace as needed   Acute kidney injury Resolved with IV fluids.    Mild anemia of chronic disease hemoglobin stable around 11.   DVT prophylaxis: Eliquis Code Status: FULL CODE.  Family Communication: none at bedside, called wife and daughter with the phone number available. Couldn't reach them and the voice mail box  Is full and couldn't take any messages. Will try to reach  them again later today.  Disposition Plan: pending clinical improvement.   Consultants:   None.   Procedures:none.    Antimicrobials: none.    Subjective: No chest pain or sob.   Objective: Vitals:   05/10/18 0912 05/10/18 0914 05/10/18 0944 05/10/18 1229  BP:  (!) 146/107 (!) 155/93 (!) 160/91  Pulse:    85  Resp: 20 (!) 26 17 (!) 27  Temp:    (!) 97.1 F (36.2 C)  TempSrc:    Axillary  SpO2:      Weight:      Height:        Intake/Output Summary (Last 24 hours) at 05/10/2018 1309 Last data filed at 05/10/2018 1006 Gross per 24 hour  Intake 2280 ml  Output 1425 ml  Net 855 ml   Filed Weights   05/09/18 1314  Weight: 64.4 kg    Examination:  General exam: Appears calm and comfortable  Respiratory system: Clear to auscultation. Respiratory effort normal. Cardiovascular system: S1 & S2 heard, RRR.  No pedal edema. Gastrointestinal system: Abdomen is nondistended, soft and nontender. Normal bowel sounds heard. Central nervous system: Alert and oriented to person only . Able to move all extremities.  Extremities: Symmetric 5 x 5 power. Skin: No rashes, lesions or ulcers Psychiatry:  Mood & affect appropriate.     Data Reviewed: I have personally reviewed following labs and imaging studies  CBC: Recent Labs  Lab 05/09/18 1330 05/10/18 0447  WBC 4.5 6.5  NEUTROABS 2.7  --   HGB 10.9* 11.4*  HCT 33.0* 34.9*  MCV 94.0 92.1  PLT 156  180   Basic Metabolic Panel: Recent Labs  Lab 05/09/18 1330 05/10/18 0447  NA 144 141  K 3.6 2.9*  CL 110 109  CO2 23 21*  GLUCOSE 101* 136*  BUN 27* 25*  CREATININE 1.44* 1.14  CALCIUM 9.3 9.2   GFR: Estimated Creatinine Clearance: 43.2 mL/min (by C-G formula based on SCr of 1.14 mg/dL). Liver Function Tests: Recent Labs  Lab 05/09/18 1330 05/10/18 0447  AST 18 24  ALT 15 21  ALKPHOS 49 56  BILITOT 0.8 0.7  PROT 6.1* 6.7  ALBUMIN 3.4* 3.7   No results for input(s): LIPASE, AMYLASE in the last 168  hours. No results for input(s): AMMONIA in the last 168 hours. Coagulation Profile: No results for input(s): INR, PROTIME in the last 168 hours. Cardiac Enzymes: No results for input(s): CKTOTAL, CKMB, CKMBINDEX, TROPONINI in the last 168 hours. BNP (last 3 results) No results for input(s): PROBNP in the last 8760 hours. HbA1C: No results for input(s): HGBA1C in the last 72 hours. CBG: No results for input(s): GLUCAP in the last 168 hours. Lipid Profile: No results for input(s): CHOL, HDL, LDLCALC, TRIG, CHOLHDL, LDLDIRECT in the last 72 hours. Thyroid Function Tests: No results for input(s): TSH, T4TOTAL, FREET4, T3FREE, THYROIDAB in the last 72 hours. Anemia Panel: No results for input(s): VITAMINB12, FOLATE, FERRITIN, TIBC, IRON, RETICCTPCT in the last 72 hours. Sepsis Labs: No results for input(s): PROCALCITON, LATICACIDVEN in the last 168 hours.  No results found for this or any previous visit (from the past 240 hour(s)).       Radiology Studies: No results found.      Scheduled Meds: . potassium chloride  40 mEq Oral BID   Continuous Infusions: . sodium chloride 100 mL/hr at 05/10/18 0252     LOS: 1 day    Time spent: 36 minutes    Kathlen Mody, MD [facility] Pager [phone]  If 7PM-7AM, please contact night-coverage [email or web address] Password [facility] 05/10/2018, 1:09 PM    Addendum:  Urinary retention: > 800.  inand out cath ordered.  And get UA.    Pt tachypneic and SOB Stop fluids and get CXR for evaluation of pulm edema.    Kathlen Mody, MD

## 2018-05-10 NOTE — Progress Notes (Signed)
In and Out Cath done. Urine out = 525 ml. U/A sent.

## 2018-05-10 NOTE — Progress Notes (Signed)
Bladder scan done = 837 urine.

## 2018-05-11 ENCOUNTER — Inpatient Hospital Stay (HOSPITAL_COMMUNITY): Payer: Medicare Other

## 2018-05-11 DIAGNOSIS — I340 Nonrheumatic mitral (valve) insufficiency: Secondary | ICD-10-CM

## 2018-05-11 DIAGNOSIS — J9601 Acute respiratory failure with hypoxia: Secondary | ICD-10-CM

## 2018-05-11 DIAGNOSIS — I361 Nonrheumatic tricuspid (valve) insufficiency: Secondary | ICD-10-CM

## 2018-05-11 DIAGNOSIS — J810 Acute pulmonary edema: Secondary | ICD-10-CM

## 2018-05-11 DIAGNOSIS — Z9911 Dependence on respirator [ventilator] status: Secondary | ICD-10-CM

## 2018-05-11 DIAGNOSIS — I469 Cardiac arrest, cause unspecified: Secondary | ICD-10-CM

## 2018-05-11 LAB — PROTIME-INR
INR: 1.5 — ABNORMAL HIGH (ref 0.8–1.2)
Prothrombin Time: 17.5 seconds — ABNORMAL HIGH (ref 11.4–15.2)

## 2018-05-11 LAB — CBC
HCT: 37 % — ABNORMAL LOW (ref 39.0–52.0)
Hemoglobin: 11.9 g/dL — ABNORMAL LOW (ref 13.0–17.0)
MCH: 30.4 pg (ref 26.0–34.0)
MCHC: 32.2 g/dL (ref 30.0–36.0)
MCV: 94.6 fL (ref 80.0–100.0)
Platelets: 202 10*3/uL (ref 150–400)
RBC: 3.91 MIL/uL — ABNORMAL LOW (ref 4.22–5.81)
RDW: 15.1 % (ref 11.5–15.5)
WBC: 11.6 10*3/uL — ABNORMAL HIGH (ref 4.0–10.5)
nRBC: 0.2 % (ref 0.0–0.2)

## 2018-05-11 LAB — BLOOD GAS, ARTERIAL
Acid-base deficit: 8.9 mmol/L — ABNORMAL HIGH (ref 0.0–2.0)
Bicarbonate: 17.1 mmol/L — ABNORMAL LOW (ref 20.0–28.0)
Drawn by: 55062
FIO2: 100
O2 Saturation: 97.2 %
Patient temperature: 98.6
pCO2 arterial: 41.4 mmHg (ref 32.0–48.0)
pH, Arterial: 7.239 — ABNORMAL LOW (ref 7.350–7.450)
pO2, Arterial: 127 mmHg — ABNORMAL HIGH (ref 83.0–108.0)

## 2018-05-11 LAB — CBC WITH DIFFERENTIAL/PLATELET
Abs Immature Granulocytes: 0.06 10*3/uL (ref 0.00–0.07)
Basophils Absolute: 0 10*3/uL (ref 0.0–0.1)
Basophils Relative: 0 %
Eosinophils Absolute: 0 10*3/uL (ref 0.0–0.5)
Eosinophils Relative: 0 %
HCT: 36.9 % — ABNORMAL LOW (ref 39.0–52.0)
Hemoglobin: 11.9 g/dL — ABNORMAL LOW (ref 13.0–17.0)
Immature Granulocytes: 1 %
Lymphocytes Relative: 4 %
Lymphs Abs: 0.4 10*3/uL — ABNORMAL LOW (ref 0.7–4.0)
MCH: 30.4 pg (ref 26.0–34.0)
MCHC: 32.2 g/dL (ref 30.0–36.0)
MCV: 94.1 fL (ref 80.0–100.0)
Monocytes Absolute: 0.7 10*3/uL (ref 0.1–1.0)
Monocytes Relative: 8 %
Neutro Abs: 8 10*3/uL — ABNORMAL HIGH (ref 1.7–7.7)
Neutrophils Relative %: 87 %
Platelets: 181 10*3/uL (ref 150–400)
RBC: 3.92 MIL/uL — ABNORMAL LOW (ref 4.22–5.81)
RDW: 15.1 % (ref 11.5–15.5)
WBC: 9.1 10*3/uL (ref 4.0–10.5)
nRBC: 0 % (ref 0.0–0.2)

## 2018-05-11 LAB — COMPREHENSIVE METABOLIC PANEL
ALT: 48 U/L — ABNORMAL HIGH (ref 0–44)
ALT: 54 U/L — ABNORMAL HIGH (ref 0–44)
AST: 110 U/L — ABNORMAL HIGH (ref 15–41)
AST: 119 U/L — ABNORMAL HIGH (ref 15–41)
Albumin: 3.6 g/dL (ref 3.5–5.0)
Albumin: 3.6 g/dL (ref 3.5–5.0)
Alkaline Phosphatase: 63 U/L (ref 38–126)
Alkaline Phosphatase: 54 U/L (ref 38–126)
Anion gap: 17 — ABNORMAL HIGH (ref 5–15)
Anion gap: 19 — ABNORMAL HIGH (ref 5–15)
BUN: 26 mg/dL — ABNORMAL HIGH (ref 8–23)
BUN: 28 mg/dL — ABNORMAL HIGH (ref 8–23)
CO2: 16 mmol/L — ABNORMAL LOW (ref 22–32)
CO2: 17 mmol/L — ABNORMAL LOW (ref 22–32)
Calcium: 8.8 mg/dL — ABNORMAL LOW (ref 8.9–10.3)
Calcium: 9.3 mg/dL (ref 8.9–10.3)
Chloride: 111 mmol/L (ref 98–111)
Chloride: 110 mmol/L (ref 98–111)
Creatinine, Ser: 1.35 mg/dL — ABNORMAL HIGH (ref 0.61–1.24)
Creatinine, Ser: 1.22 mg/dL (ref 0.61–1.24)
GFR calc Af Amer: 55 mL/min — ABNORMAL LOW (ref >60)
GFR calc Af Amer: >60 mL/min (ref >60)
GFR calc non Af Amer: 48 mL/min — ABNORMAL LOW (ref >60)
GFR calc non Af Amer: 54 mL/min — ABNORMAL LOW (ref >60)
Glucose, Bld: 236 mg/dL — ABNORMAL HIGH (ref 70–99)
Glucose, Bld: 149 mg/dL — ABNORMAL HIGH (ref 70–99)
Potassium: 3.1 mmol/L — ABNORMAL LOW (ref 3.5–5.1)
Potassium: 3.8 mmol/L (ref 3.5–5.1)
Sodium: 144 mmol/L (ref 135–145)
Sodium: 146 mmol/L — ABNORMAL HIGH (ref 135–145)
Total Bilirubin: 0.8 mg/dL (ref 0.3–1.2)
Total Bilirubin: 0.8 mg/dL (ref 0.3–1.2)
Total Protein: 6.6 g/dL (ref 6.5–8.1)
Total Protein: 6.6 g/dL (ref 6.5–8.1)

## 2018-05-11 LAB — POCT I-STAT 7, (LYTES, BLD GAS, ICA,H+H)
Acid-base deficit: 1 mmol/L (ref 0.0–2.0)
Bicarbonate: 21.5 mmol/L (ref 20.0–28.0)
Calcium, Ion: 1.23 mmol/L (ref 1.15–1.40)
HCT: 33 % — ABNORMAL LOW (ref 39.0–52.0)
Hemoglobin: 11.2 g/dL — ABNORMAL LOW (ref 13.0–17.0)
O2 Saturation: 99 %
Patient temperature: 96.4
Potassium: 4.2 mmol/L (ref 3.5–5.1)
Sodium: 143 mmol/L (ref 135–145)
TCO2: 22 mmol/L (ref 22–32)
pCO2 arterial: 28.3 mmHg — ABNORMAL LOW (ref 32.0–48.0)
pH, Arterial: 7.484 — ABNORMAL HIGH (ref 7.350–7.450)
pO2, Arterial: 129 mmHg — ABNORMAL HIGH (ref 83.0–108.0)

## 2018-05-11 LAB — ECHOCARDIOGRAM LIMITED
Height: 71 in
Weight: 2543.23 oz

## 2018-05-11 LAB — MAGNESIUM
Magnesium: 2.2 mg/dL (ref 1.7–2.4)
Magnesium: 2 mg/dL (ref 1.7–2.4)

## 2018-05-11 LAB — PHOSPHORUS: Phosphorus: 5.4 mg/dL — ABNORMAL HIGH (ref 2.5–4.6)

## 2018-05-11 LAB — TROPONIN I
Troponin I: 10.78 ng/mL (ref <0.03)
Troponin I: 23.58 ng/mL (ref <0.03)
Troponin I: 28.68 ng/mL (ref <0.03)

## 2018-05-11 LAB — GLUCOSE, CAPILLARY
Glucose-Capillary: 161 mg/dL — ABNORMAL HIGH (ref 70–99)
Glucose-Capillary: 123 mg/dL — ABNORMAL HIGH (ref 70–99)
Glucose-Capillary: 80 mg/dL (ref 70–99)
Glucose-Capillary: 87 mg/dL (ref 70–99)
Glucose-Capillary: 81 mg/dL (ref 70–99)
Glucose-Capillary: 88 mg/dL (ref 70–99)

## 2018-05-11 LAB — LACTIC ACID, PLASMA
Lactic Acid, Venous: 5.1 mmol/L (ref 0.5–1.9)
Lactic Acid, Venous: 2.1 mmol/L (ref 0.5–1.9)

## 2018-05-11 LAB — APTT: aPTT: 35 seconds (ref 24–36)

## 2018-05-11 LAB — RAPID URINE DRUG SCREEN, HOSP PERFORMED
Amphetamines: NOT DETECTED
Barbiturates: NOT DETECTED
Benzodiazepines: NOT DETECTED
Cocaine: NOT DETECTED
Opiates: NOT DETECTED
Tetrahydrocannabinol: NOT DETECTED

## 2018-05-11 LAB — MRSA PCR SCREENING: MRSA by PCR: NEGATIVE

## 2018-05-11 LAB — TRIGLYCERIDES: Triglycerides: 37 mg/dL (ref <150)

## 2018-05-11 LAB — AMMONIA: Ammonia: 17 umol/L (ref 9–35)

## 2018-05-11 LAB — POTASSIUM: Potassium: 3.7 mmol/L (ref 3.5–5.1)

## 2018-05-11 MED ORDER — FENTANYL CITRATE (PF) 100 MCG/2ML IJ SOLN
INTRAMUSCULAR | Status: AC
  Administered 2018-05-11: 100 ug via INTRAVENOUS
  Filled 2018-05-11: qty 2

## 2018-05-11 MED ORDER — SODIUM CHLORIDE 0.9 % IV SOLN
INTRAVENOUS | Status: DC | PRN
  Administered 2018-05-11 (×2): via INTRAVENOUS

## 2018-05-11 MED ORDER — CHLORHEXIDINE GLUCONATE CLOTH 2 % EX PADS
6.0000 | MEDICATED_PAD | Freq: Every day | CUTANEOUS | Status: DC
  Administered 2018-05-11 – 2018-05-12 (×2): 6 via TOPICAL

## 2018-05-11 MED ORDER — PROPOFOL 1000 MG/100ML IV EMUL
0.0000 ug/kg/min | INTRAVENOUS | Status: DC
  Administered 2018-05-11 (×2): 5 ug/kg/min via INTRAVENOUS
  Filled 2018-05-11: qty 100

## 2018-05-11 MED ORDER — POTASSIUM CHLORIDE 10 MEQ/100ML IV SOLN
10.0000 meq | INTRAVENOUS | Status: AC
  Administered 2018-05-11 (×2): 10 meq via INTRAVENOUS
  Filled 2018-05-11 (×3): qty 100

## 2018-05-11 MED ORDER — PROPOFOL 1000 MG/100ML IV EMUL
INTRAVENOUS | Status: AC
  Filled 2018-05-11: qty 100

## 2018-05-11 MED ORDER — FUROSEMIDE 10 MG/ML IJ SOLN
60.0000 mg | Freq: Once | INTRAMUSCULAR | Status: AC
  Administered 2018-05-11: 60 mg via INTRAVENOUS
  Filled 2018-05-11: qty 6

## 2018-05-11 MED ORDER — SODIUM CHLORIDE 0.9% FLUSH: 10.0000 mL | INTRAVENOUS | Status: DC | PRN

## 2018-05-11 MED ORDER — CHLORHEXIDINE GLUCONATE 0.12% ORAL RINSE (MEDLINE KIT)
15.0000 mL | Freq: Two times a day (BID) | OROMUCOSAL | Status: DC
  Administered 2018-05-11 (×2): 15 mL via OROMUCOSAL

## 2018-05-11 MED ORDER — PANTOPRAZOLE SODIUM 40 MG IV SOLR
40.0000 mg | Freq: Every day | INTRAVENOUS | Status: DC
  Administered 2018-05-11 – 2018-05-14 (×4): 40 mg via INTRAVENOUS
  Filled 2018-05-11 (×4): qty 40

## 2018-05-11 MED ORDER — MIDAZOLAM HCL 2 MG/2ML IJ SOLN: 1.0000 mg | INTRAMUSCULAR | Status: DC | PRN

## 2018-05-11 MED ORDER — CHLORHEXIDINE GLUCONATE 0.12% ORAL RINSE (MEDLINE KIT)
15.0000 mL | Freq: Two times a day (BID) | OROMUCOSAL | Status: DC
  Administered 2018-05-11: 01:00:00 15 mL via OROMUCOSAL

## 2018-05-11 MED ORDER — IPRATROPIUM-ALBUTEROL 0.5-2.5 (3) MG/3ML IN SOLN: 3.0000 mL | Freq: Four times a day (QID) | RESPIRATORY_TRACT | Status: DC | PRN

## 2018-05-11 MED ORDER — MIDAZOLAM HCL 2 MG/2ML IJ SOLN
1.0000 mg | INTRAMUSCULAR | Status: DC | PRN
  Administered 2018-05-11: 09:00:00 1 mg via INTRAVENOUS
  Filled 2018-05-11: qty 2

## 2018-05-11 MED ORDER — ORAL CARE MOUTH RINSE: 15.0000 mL | OROMUCOSAL | Status: DC

## 2018-05-11 MED ORDER — POTASSIUM CHLORIDE 10 MEQ/100ML IV SOLN
INTRAVENOUS | Status: AC
  Administered 2018-05-11: 01:00:00 10 meq via INTRAVENOUS
  Filled 2018-05-11: qty 100

## 2018-05-11 MED ORDER — FENTANYL CITRATE (PF) 100 MCG/2ML IJ SOLN: 25.0000 ug | INTRAMUSCULAR | Status: DC | PRN

## 2018-05-11 MED ORDER — ENOXAPARIN SODIUM 80 MG/0.8ML ~~LOC~~ SOLN
70.0000 mg | Freq: Two times a day (BID) | SUBCUTANEOUS | Status: DC
  Administered 2018-05-11 – 2018-05-14 (×7): 70 mg via SUBCUTANEOUS
  Filled 2018-05-11 (×4): qty 0.7
  Filled 2018-05-11 (×4): qty 0.8

## 2018-05-11 MED ORDER — POTASSIUM CHLORIDE 10 MEQ/100ML IV SOLN
10.0000 meq | INTRAVENOUS | Status: AC
  Administered 2018-05-11 (×3): 10 meq via INTRAVENOUS
  Filled 2018-05-11: qty 100

## 2018-05-11 MED ORDER — DEXMEDETOMIDINE HCL IN NACL 200 MCG/50ML IV SOLN
0.0000 ug/kg/h | INTRAVENOUS | Status: DC
  Administered 2018-05-11: 20:00:00 0.4 ug/kg/h via INTRAVENOUS
  Administered 2018-05-11: 0.6 ug/kg/h via INTRAVENOUS
  Administered 2018-05-11: 0.4 ug/kg/h via INTRAVENOUS
  Filled 2018-05-11 (×3): qty 50

## 2018-05-11 MED ORDER — INSULIN ASPART 100 UNIT/ML ~~LOC~~ SOLN
0.0000 [IU] | SUBCUTANEOUS | Status: DC
  Administered 2018-05-11 – 2018-05-14 (×2): 1 [IU] via SUBCUTANEOUS

## 2018-05-11 MED ORDER — FENTANYL CITRATE (PF) 100 MCG/2ML IJ SOLN
25.0000 ug | INTRAMUSCULAR | Status: DC | PRN
  Administered 2018-05-11 (×2): 100 ug via INTRAVENOUS
  Filled 2018-05-11: qty 2

## 2018-05-11 MED ORDER — ORAL CARE MOUTH RINSE
15.0000 mL | OROMUCOSAL | Status: DC
  Administered 2018-05-11 (×5): 15 mL via OROMUCOSAL

## 2018-05-11 NOTE — Progress Notes (Signed)
Echocardiogram  2D Echocardiogram has been performed.  Belva Chimes 05/11/2018, 3:40 PM

## 2018-05-11 NOTE — Progress Notes (Signed)
CRITICAL VALUE ALERT  Critical Value:  Troponin 10.78  Date & Time Notied:  05/11/18 0357  Provider Notified: Pola Corn  Orders Received/Actions taken: No new orders at this time.

## 2018-05-11 NOTE — Procedures (Signed)
Extubation Procedure Note  Patient Details:   Name: Tyler Carr DOB: [birth year] MRN: [id number]   Airway Documentation:    Vent end date: 05/11/18 Vent end time: 1135   Evaluation  O2 sats: stable throughout Complications: No apparent complications Patient did tolerate procedure well. Bilateral Breath Sounds: Rhonchi, Coarse crackles, Diminished   No   Pt extubated to 5L HFNC at this time per CCM MD. Positive cuff leak noted prior to extubation. Bilateral Rhonchi NS throughout. Pt unable/unwilling to open mouth post extubation to suction secretions. Unsure as to pt's baseline but unable to speak post extubation. MD and RN aware.   Carolan Shiver 05/11/2018, 11:41 AM

## 2018-05-11 NOTE — Progress Notes (Signed)
NAME:  Tyler Carr, MRN:  [id number], DOB:  10/29/1930, LOS: 2 ADMISSION DATE:  05/09/2018, CONSULTATION DATE:  Rapid response REFERRING MD:  Rapid response, CHIEF COMPLAINT:  Cardiac arrest   Brief History   tx from floor with cardiac arrest  History of present illness   85 year old male with history of chf, renal artery stenosis, htn, chronic bradycardia, hld, afib presented to hospital on 05/10/18 for drug overdose and tx to ICU on 05/11/18 with cardiac arrest.    Pt was admitetd 05/10/18 after being noted uintentionally taking all weeks meds in one sitting; including hydralazine, eliquis, coreg, norvasc, flomax, lasix, namenda.  The patient developed weakness and was taken to ed.  He was given sorbitol and activated charcoal.  Recommended for 24 hour surveillance.    RRT at 10:40 pm with hypoxia on 2L [place].  He was increased to 6L [place].  abg 7.21/49/56.  cxr with vascular congestion.  Given lasix, xanax.  ssats improved to 90%.    RRT called 12am with cardiac arrest.  Pt was in pea for 12 minutes and received 3 rounds of epi.  Returned to slow afib.  Oxygen 100%.  Alert and agitated with ETT.  Started on propofol gtt  Past Medical History  Chg Chronic bradycardia hld afib  Significant Hospital Events   ETT 05/11/18  Consults:    Procedures:  ETT 05/11/18  Significant Diagnostic Tests:  cxr mild cardiomegaly Bedside echo 05/11/18 no obvious wall motion abnormalities, ef around 50%  Micro Data:    Antimicrobials:     Interim history/subjective:  Intermittently agitated overnight; RN reports he will intermittently answer yes/no questions appropriately   Objective   Blood pressure 115/71, pulse 75, temperature (!) 97 F (36.1 C), resp. rate 18, height  (1.803 m), weight 72.1 kg, SpO2 98 %.    Vent Mode: PRVC FiO2 (%):  [60 %-100 %] 60 % Set Rate:  [16 bmp] 16 bmp Vt Set:  [500 mL-600 mL] 600 mL PEEP:  [5 cmH20] 5 cmH20 Plateau Pressure:  [17 cmH20-23 cmH20] 17  cmH20   Intake/Output Summary (Last 24 hours) at 05/11/2018 4098 Last data filed at 05/11/2018 0800 Gross per 24 hour  Intake 2108.62 ml  Output 2000 ml  Net 108.62 ml   Filed Weights   05/09/18 1314 05/11/18 0100  Weight: 64.4 kg 72.1 kg    Examination: General: NAD, awakes to painful stimuli and intermittently shake head/nod HENT: PERRL, ETT/OG in place, anicteric sclera Lungs: CTAB, no wheezing or rales Cardiovascular: irregularly irregular rhythm Abdomen: soft nt nd bs+ Extremities: mild pedal edema Neuro: sedated, wakes to sternal rub; will intermittently appropriately shake/nod head to questions GU: foley in place  Resolved Hospital Problem list     Assessment & Plan:  85 year old male with history of chronic bradycardia, afib, chf presented with drug overdose tx to icu for cardiac arrest  PEA Cardiac arrest in setting of hypoxia Troponin elevated to 10 initially; no ST elevation of EKG.  - trend troponin - telemetry - on therapeutic lovenox  Acute hypoxic respiratory failure: AM ABG 7.48/28/129/21 - decrease FIO2 to 50, decrease rate to 12 - wean as able  Anion gap metabolic acidosis, lactic acidosis in setting of cardiac arrest - trend lactic acid  Slow afib - will start therapeutic lovenox per pharmacy  - will hold coreg for now   CHF; last EF 50% with diffuse hypokinesis - will hold lasix for now   AKI- resolved  -  will place foley  - will monitor urine output  - replete electrolytes as necessary  Shock liver: - trend hepatic function panel  Urinary retention on ED presentation  - will place foley catheter   HTN - will continue hydralazine prn order   Best practice:  Diet: NPO Pain/Anxiety/Delirium protocol (if indicated): propofol gtt  VAP protocol (if indicated): not indicated DVT prophylaxis: lovenox therapeutic for afib GI prophylaxis: protonix  Glucose control: SSI Mobility: bedrest Code Status: full code Family Communication:  last discussed on transfer to ICU AM of 4/26; family wants still full code   Disposition: ICU  Labs   CBC: Recent Labs  Lab 05/09/18 1330 05/10/18 0447 05/11/18 0023 05/11/18 0217 05/11/18 0531  WBC 4.5 6.5 11.6* 9.1  --   NEUTROABS 2.7  --   --  8.0*  --   HGB 10.9* 11.4* 11.9* 11.9* 11.2*  HCT 33.0* 34.9* 37.0* 36.9* 33.0*  MCV 94.0 92.1 94.6 94.1  --   PLT 156 180 202 181  --     Basic Metabolic Panel: Recent Labs  Lab 05/09/18 1330 05/10/18 0447 05/10/18 1815 05/11/18 0023 05/11/18 0217 05/11/18 0531  NA 144 141  --  144 146* 143  K 3.6 2.9* 3.4* 3.1* 3.8 4.2  CL 110 109  --  111 110  --   CO2 23 21*  --  16* 17*  --   GLUCOSE 101* 136*  --  236* 149*  --   BUN 27* 25*  --  26* 28*  --   CREATININE 1.44* 1.14  --  1.35* 1.22  --   CALCIUM 9.3 9.2  --  8.8* 9.3  --   MG  --   --   --   --  2.2  --   PHOS  --   --   --   --  5.4*  --    GFR: Estimated Creatinine Clearance: 45.1 mL/min (by C-G formula based on SCr of 1.22 mg/dL). Recent Labs  Lab 05/09/18 1330 05/10/18 0447 05/11/18 0023 05/11/18 0217  WBC 4.5 6.5 11.6* 9.1  LATICACIDVEN  --   --   --  5.1*    Liver Function Tests: Recent Labs  Lab 05/09/18 1330 05/10/18 0447 05/11/18 0023 05/11/18 0217  AST 18 24 110* 119*  ALT 15 21 48* 54*  ALKPHOS 49 56 63 54  BILITOT 0.8 0.7 0.8 0.8  PROT 6.1* 6.7 6.6 6.6  ALBUMIN 3.4* 3.7 3.6 3.6   No results for input(s): LIPASE, AMYLASE in the last 168 hours. Recent Labs  Lab 05/11/18 0217  AMMONIA 17    ABG    Component Value Date/Time   PHART 7.484 (H) 05/11/2018 0531   PCO2ART 28.3 (L) 05/11/2018 0531   PO2ART 129.0 (H) 05/11/2018 0531   HCO3 21.5 05/11/2018 0531   TCO2 22 05/11/2018 0531   ACIDBASEDEF 1.0 05/11/2018 0531   O2SAT 99.0 05/11/2018 0531     Coagulation Profile: Recent Labs  Lab 05/11/18 0217  INR 1.5*    Cardiac Enzymes: Recent Labs  Lab 05/11/18 0217  TROPONINI 10.78*    HbA1C: No results found for: HGBA1C   CBG: Recent Labs  Lab 05/11/18 0159 05/11/18 0331 05/11/18 0742  GLUCAP 161* 123* 80    Review of Systems:   Unable to obtain due to sedation  Past Medical History  He,  has a past medical history of CHF (congestive heart failure) (HCC), Dementia (HCC), Hyperlipidemia, and Hypertension.   Surgical History  Past Surgical History:  Procedure Laterality Date  . ABDOMINAL AORTOGRAM N/A 02/04/2018   Procedure: ABDOMINAL AORTOGRAM;  Surgeon: Nada LibmanBrabham, Vance W, MD;  Location: [facility];  Service: Cardiovascular;  Laterality: N/A;  . HERNIA REPAIR    . PERIPHERAL VASCULAR INTERVENTION Left 02/04/2018   Procedure: PERIPHERAL VASCULAR INTERVENTION;  Surgeon: Nada LibmanBrabham, Vance W, MD;  Location: [facility];  Service: Cardiovascular;  Laterality: Left;  Renal artery     Social History   reports that he has never smoked. He has never used smokeless tobacco. He reports previous alcohol use. He reports previous drug use.   Family History   His family history includes Stroke in his mother.   Allergies No Known Allergies   Home Medications  Prior to Admission medications   Medication Sig Start Date End Date Taking? Authorizing Provider  amLODipine (NORVASC) 5 MG tablet Take 1 tablet (5 mg total) by mouth daily. 02/06/18  Yes Mikhail, Nita SellsMaryann, DO  apixaban (ELIQUIS) 5 MG TABS tablet Take 5 mg by mouth 2 (two) times daily.   Yes [provider]  aspirin EC 81 MG EC tablet Take 1 tablet (81 mg total) by mouth daily. 02/06/18  Yes Mikhail, Maryann, DO  brimonidine (ALPHAGAN) 0.15 % ophthalmic solution Place 1 drop into both eyes 2 (two) times a day.   Yes [provider]  carvedilol (COREG) 25 MG tablet Take 12.5 mg by mouth 2 (two) times daily.   Yes [provider]  dorzolamide-timolol (COSOPT) 22.3-6.8 MG/ML ophthalmic solution Place 1 drop into both eyes 2 (two) times daily.   Yes [provider]  furosemide (LASIX) 40 MG tablet Take 40 mg by  mouth daily.   Yes [provider]  hydrALAZINE (APRESOLINE) 50 MG tablet Take 1 tablet (50 mg total) by mouth every 8 (eight) hours. 02/05/18  Yes Mikhail, Nita SellsMaryann, DO  lisinopril (PRINIVIL,ZESTRIL) 40 MG tablet Take 40 mg by mouth daily.   Yes [provider]  memantine (NAMENDA) 10 MG tablet Take 5 mg by mouth See admin instructions. Titration: Take 5 mg by mouth at bedtime for 2 weeks, then 10 mg at bedtime for 2 weeks, then 15 mg at bedtime for 2 weeks, then 20 mg at bedtime (thereafter) 04/21/18  Yes [provider]  PRESCRIPTION MEDICATION 1 application See admin instructions. [employer]- Apply as directed to bilateral legs/waist area every morning for a period of 1 hour   Yes [provider]  simvastatin (ZOCOR) 40 MG tablet Take 20 mg by mouth at bedtime.   Yes [provider]  tamsulosin (FLOMAX) 0.4 MG CAPS capsule Take 1 capsule (0.4 mg total) by mouth daily. 02/06/18  Yes Mikhail, Nita SellsMaryann, DO  Melatonin 3 MG TABS Take 3-6 mg by mouth at bedtime as needed (sleep and agitation).    [provider]     Nyra MarketGorica Adis Sturgill, MD PGY3 Pager [phone]

## 2018-05-11 NOTE — Procedures (Signed)
Intubation Procedure Note Tyler Carr [id number] 1930/12/02  Procedure: Intubation Indications: Respiratory insufficiency  Procedure Details Consent: Unable to obtain consent because of emergent medical necessity. Time Out: Verified patient identification, verified procedure, site/side was marked, verified correct patient position, special equipment/implants available, medications/allergies/relevent history reviewed, required imaging and test results available.  Performed  Maximum sterile technique was used including antiseptics, cap, gloves, hand hygiene and mask.  MAC and 4    Evaluation Hemodynamic Status: BP stable throughout; O2 sats: stable throughout Patient's Current Condition: Unstable Complications: No apparent complications Patient did tolerate procedure well. Chest X-ray ordered to verify placement.  CXR: pending.   Leigh Aurora, B.S, RRT, RCP 05/11/2018

## 2018-05-11 NOTE — Progress Notes (Signed)
PT Cancellation Note  Patient Details Name: Tyler Carr MRN: [id number] DOB: [birth year]   Cancelled Treatment:    Reason Eval/Treat Not Completed: Patient not medically ready (cardiac arrest noted; troponin 10.78).  Laurina Bustle, PT, DPT Acute Rehabilitation Services Pager [phone] Office [phone]    Vanetta Mulders 05/11/2018, 7:58 AM

## 2018-05-11 NOTE — H&P (Signed)
NAME:  Tyler GuardianMarvin Kathman, MRN:  [id number], DOB:  10/01/1930, LOS: 2 ADMISSION DATE:  05/09/2018, CONSULTATION DATE:  Rapid response REFERRING MD:  Rapid response, CHIEF COMPLAINT:  Cardiac arrest   Brief History   tx from floor with cardiac arrest  History of present illness   85 year old male with history of chf, renal artery stenosis, htn, chronic bradycardia, hld, afib presented to hospital on 05/10/18 for drug overdose and tx to ICU on 05/11/18 with cardiac arrest.    Pt was admitetd 05/10/18 after being noted uintentionally taking all weeks meds in one sitting; including hydralazine, eliquis, coreg, norvasc, flomax, lasix, namenda.  The patient developed weakness and was taken to ed.  He was given sorbitol and activated charcoal.  Recommended for 24 hour surveillance.    RRT at 10:40 pm with hypoxia on 2L [place].  He was increased to 6L [place].  abg 7.21/49/56.  cxr with vascular congestion.  Given lasix, xanax.  ssats improved to 90%.    RRT called 12am with cardiac arrest.  Pt was in pea for 12 minutes and received 3 rounds of epi.  Returned to slow afib.  Oxygen 100%.  Alert and agitated with ETT.  Started on propofol gtt  Past Medical History  Chg Chronic bradycardia hld afib  Significant Hospital Events   ETT 05/11/18  Consults:    Procedures:  ETT 05/11/18  Significant Diagnostic Tests:  cxr mild cardiomegaly Bedside echo 05/11/18 no obvious wall motion abnormalities, ef around 50%  Micro Data:    Antimicrobials:     Interim history/subjective:    Objective   Blood pressure 124/90, pulse 67, temperature (!) 97.5 F (36.4 C), temperature source Oral, resp. rate (!) 25, height 5\' 11"  (1.803 m), weight 72.1 kg, SpO2 100 %.    Vent Mode: PRVC FiO2 (%):  [100 %] 100 % Set Rate:  [16 bmp] 16 bmp Vt Set:  [500 mL] 500 mL PEEP:  [5 cmH20] 5 cmH20 Plateau Pressure:  [23 cmH20] 23 cmH20   Intake/Output Summary (Last 24 hours) at 05/11/2018 0134 Last data filed at 05/11/2018  0130 Gross per 24 hour  Intake 2433.91 ml  Output 1950 ml  Net 483.91 ml   Filed Weights   05/09/18 1314 05/11/18 0100  Weight: 64.4 kg 72.1 kg    Examination: General: nad, alert with stimulation HENT: perrla Lungs: cta b/l Cardiovascular: irregular rhythm, regular rate, no m/r/g Abdomen: soft nt nd bs+ Extremities: no le edema Neuro: no focal deficits GU:   Resolved Hospital Problem list     Assessment & Plan:  85 year old male with history of chronic bradycardia, afib, chf presented with drug overdose tx to icu for cardiac arrest  Cardiac arrest- pea arrest on floor after two episodes of hypoxia.  He was admitted for accidental drug overdose at home .  Cardiac arrest seemed to be related to hypoxic resp failure which is likey from altered mental status.  Beside echo showed no wma, approximately normal ef.   - will trend trop  - will follow lactate  - does not need hypothermia protocol - will get utox for altered mental status evaluation  - on vent prvc 528ml/kg  Metabolic acidosis- uncertain etiology.  Last bs was 236.  AG17.  Recheck bedside 161 - will repeat cmp  Electrolytes- k- 3.1 - will replete potassium  - will check mag, phose - will place on icu replacement order set   Slow afib - will start therapeutic lovenox per  pharmacy  - will hold coreg for now   chf - will hold lasix for now   AKI- uncertain etiology  - will place foley  - will monitor urine output   Urinary retention on ed presentation  - will place foley catheter   htn - will continue hydralazine prn order    Best practice:  Diet: NPI Pain/Anxiety/Delirium protocol (if indicated): propofol gtt  VAP protocol (if indicated): not indicated DVT prophylaxis: lovenox therapeutic for afib GI prophylaxis: protonix  Glucose control: SSI Mobility: bedrest Code Status: full code Family Communication: discussed patietn case with daugther.  She is aware of intubation.  She would like to  continue full code.   Disposition: ICU  Labs   CBC: Recent Labs  Lab 05/09/18 1330 05/10/18 0447 05/11/18 0023  WBC 4.5 6.5 11.6*  NEUTROABS 2.7  --   --   HGB 10.9* 11.4* 11.9*  HCT 33.0* 34.9* 37.0*  MCV 94.0 92.1 94.6  PLT 156 180 202    Basic Metabolic Panel: Recent Labs  Lab 05/09/18 1330 05/10/18 0447 05/10/18 1815 05/11/18 0023  NA 144 141  --  144  K 3.6 2.9* 3.4* 3.1*  CL 110 109  --  111  CO2 23 21*  --  16*  GLUCOSE 101* 136*  --  236*  BUN 27* 25*  --  26*  CREATININE 1.44* 1.14  --  1.35*  CALCIUM 9.3 9.2  --  8.8*   GFR: Estimated Creatinine Clearance: 40.8 mL/min (A) (by C-G formula based on SCr of 1.35 mg/dL (H)). Recent Labs  Lab 05/09/18 1330 05/10/18 0447 05/11/18 0023  WBC 4.5 6.5 11.6*    Liver Function Tests: Recent Labs  Lab 05/09/18 1330 05/10/18 0447 05/11/18 0023  AST 18 24 110*  ALT 15 21 48*  ALKPHOS 49 56 63  BILITOT 0.8 0.7 0.8  PROT 6.1* 6.7 6.6  ALBUMIN 3.4* 3.7 3.6   No results for input(s): LIPASE, AMYLASE in the last 168 hours. No results for input(s): AMMONIA in the last 168 hours.  ABG    Component Value Date/Time   PHART 7.239 (L) 05/11/2018 0036   PCO2ART 41.4 05/11/2018 0036   PO2ART 127 (H) 05/11/2018 0036   HCO3 17.1 (L) 05/11/2018 0036   TCO2 23 01/30/2018 1552   ACIDBASEDEF 8.9 (H) 05/11/2018 0036   O2SAT 97.2 05/11/2018 0036     Coagulation Profile: No results for input(s): INR, PROTIME in the last 168 hours.  Cardiac Enzymes: No results for input(s): CKTOTAL, CKMB, CKMBINDEX, TROPONINI in the last 168 hours.  HbA1C: No results found for: HGBA1C  CBG: No results for input(s): GLUCAP in the last 168 hours.  Review of Systems:     Past Medical History  He,  has a past medical history of CHF (congestive heart failure) (HCC), Dementia (HCC), Hyperlipidemia, and Hypertension.   Surgical History    Past Surgical History:  Procedure Laterality Date  . ABDOMINAL AORTOGRAM N/A 02/04/2018    Procedure: ABDOMINAL AORTOGRAM;  Surgeon: Nada Libman, MD;  Location: [facility];  Service: Cardiovascular;  Laterality: N/A;  . HERNIA REPAIR    . PERIPHERAL VASCULAR INTERVENTION Left 02/04/2018   Procedure: PERIPHERAL VASCULAR INTERVENTION;  Surgeon: Nada Libman, MD;  Location: [facility];  Service: Cardiovascular;  Laterality: Left;  Renal artery     Social History   reports that he has never smoked. He has never used smokeless tobacco. He reports previous alcohol use. He reports  previous drug use.   Family History   His family history includes Stroke in his mother.   Allergies No Known Allergies   Home Medications  Prior to Admission medications   Medication Sig Start Date End Date Taking? Authorizing Provider  amLODipine (NORVASC) 5 MG tablet Take 1 tablet (5 mg total) by mouth daily. 02/06/18  Yes Mikhail, Nita Sells, DO  apixaban (ELIQUIS) 5 MG TABS tablet Take 5 mg by mouth 2 (two) times daily.   Yes [provider]  aspirin EC 81 MG EC tablet Take 1 tablet (81 mg total) by mouth daily. 02/06/18  Yes Mikhail, Maryann, DO  brimonidine (ALPHAGAN) 0.15 % ophthalmic solution Place 1 drop into both eyes 2 (two) times a day.   Yes [provider]  carvedilol (COREG) 25 MG tablet Take 12.5 mg by mouth 2 (two) times daily.   Yes [provider]  dorzolamide-timolol (COSOPT) 22.3-6.8 MG/ML ophthalmic solution Place 1 drop into both eyes 2 (two) times daily.   Yes [provider]  furosemide (LASIX) 40 MG tablet Take 40 mg by mouth daily.   Yes [provider]  hydrALAZINE (APRESOLINE) 50 MG tablet Take 1 tablet (50 mg total) by mouth every 8 (eight) hours. 02/05/18  Yes Mikhail, Nita Sells, DO  lisinopril (PRINIVIL,ZESTRIL) 40 MG tablet Take 40 mg by mouth daily.   Yes [provider]  memantine (NAMENDA) 10 MG tablet Take 5 mg by mouth See admin instructions. Titration: Take 5 mg by mouth at bedtime for 2 weeks, then  10 mg at bedtime for 2 weeks, then 15 mg at bedtime for 2 weeks, then 20 mg at bedtime (thereafter) 04/21/18  Yes [provider]  PRESCRIPTION MEDICATION 1 application See admin instructions. [employer]- Apply as directed to bilateral legs/waist area every morning for a period of 1 hour   Yes [provider]  simvastatin (ZOCOR) 40 MG tablet Take 20 mg by mouth at bedtime.   Yes [provider]  tamsulosin (FLOMAX) 0.4 MG CAPS capsule Take 1 capsule (0.4 mg total) by mouth daily. 02/06/18  Yes Mikhail, Nita Sells, DO  Melatonin 3 MG TABS Take 3-6 mg by mouth at bedtime as needed (sleep and agitation).    [provider]     Critical care time: 40 minutes

## 2018-05-11 NOTE — Progress Notes (Signed)
CRITICAL VALUE ALERT  Critical Value: Lactic Acid 5.1  Date & Time Notied:  05/11/18 1941  Provider Notified: Pola Corn   Orders Received/Actions taken: No new orders at this time.

## 2018-05-11 NOTE — Progress Notes (Signed)
ANTICOAGULATION CONSULT NOTE - Initial Consult  Pharmacy Consult for lovenox2 Indication: atrial fibrillation  No Known Allergies  Patient Measurements: Height: 5' 11" (180.3 cm) Weight: 158 lb 15.2 oz (72.1 kg) IBW/kg (Calculated) : 75.3  Vital Signs: Temp: 97.5 F (36.4 C) (04/25 1930) Temp Source: Oral (04/25 1930) BP: 119/78 (04/26 0100) Pulse Rate: 71 (04/26 0100)  Labs: Recent Labs    05/09/18 1330 05/10/18 0447 05/11/18 0023  HGB 10.9* 11.4* 11.9*  HCT 33.0* 34.9* 37.0*  PLT 156 180 202  CREATININE 1.44* 1.14  --     Estimated Creatinine Clearance: 48.3 mL/min (by C-G formula based on SCr of 1.14 mg/dL).   Medical History: Past Medical History:  Diagnosis Date  . CHF (congestive heart failure) (HCC)   . Dementia (HCC)   . Hyperlipidemia   . Hypertension     Medications:  Scheduled:  . apixaban  5 mg Oral BID  . chlorhexidine gluconate (MEDLINE KIT)  15 mL Mouth Rinse BID  . Chlorhexidine Gluconate Cloth  6 each Topical Daily  . mouth rinse  15 mL Mouth Rinse 10 times per day  . pantoprazole (PROTONIX) IV  40 mg Intravenous Daily  . tamsulosin  0.4 mg Oral QPC supper   Infusions:  . potassium chloride 10 mEq (05/11/18 0056)  . propofol (DIPRIVAN) infusion 5 mcg/kg/min (05/11/18 0110)    Assessment: 85 yo M on Eliquis 5 mg BID PTA for afib. Patient is now intubated post PEA arrest and pharmacy has been consulted to begin lovenox therapy for afib. Last dose of Eliquis given 4/25 @ 1529.  Goal of Therapy:  Monitor platelets by anticoagulation protocol: Yes   Plan:  Lovenox 1 mg/kg q12h (12 hours post last Eliquis dose) Will discontinue Eliquis order while on Lovenox F/u transition back to Eliquis (can be given via NG tube if one is placed)   , PharmD PGY1 Pharmacy Resident Phone [phone] 05/11/2018       1:16 AM  

## 2018-05-11 NOTE — Progress Notes (Signed)
[id number] sitter came out of room to inform me that patient was unresponsive. Upon entering room pt was unresponsive and in PEA. Code blue was activated.

## 2018-05-11 NOTE — Code Documentation (Signed)
  Patient Name: Tyler Carr   MRN: [id number]   Date of Birth/ Sex: 03-Jun-1930 , male      Admission Date: 05/09/2018  Attending Provider: Kathlen Mody, MD  Primary Diagnosis: Drug overdose, accidental or unintentional, initial encounter   Indication: Pt was in his usual state of health until this PM, when he was noted to be unresponsive and in pulseless electrical activity. Code blue was subsequently called. At the time of arrival on scene, ACLS protocol was underway.   Technical Description:  - CPR performance duration:  12 minutes  - Was defibrillation or cardioversion used? No   - Was external pacer placed? No  - Was patient intubated pre/post CPR? Yes   Medications Administered: Y = Yes; Blank = No Amiodarone    Atropine    Calcium    Epinephrine  Y  Lidocaine    Magnesium    Norepinephrine    Phenylephrine    Sodium bicarbonate  Y   Vasopressin     Post CPR evaluation:  - Final Status - Was patient successfully resuscitated ? Yes - What is current rhythm? Atrial fibrillation  - What is current hemodynamic status? Blood pressure stable, opens eyes to voice   Miscellaneous Information:  - Labs sent, including: None    - Primary team notified?  Yes  - Family Notified? No  - Additional notes/ transfer status: Transferred to MICU, 40 meq K ordered      Eulah Pont, MD  05/11/2018, 12:20 AM

## 2018-05-11 NOTE — Progress Notes (Signed)
Chaplain responded to Code Blue.  Family not present.  MD attempted to contact wife/daughter, but no answer.  Pt en route to 2H-04.  Please call as requested or needed.  Theodoro Parma [phone]    05/11/18 0000  Clinical Encounter Type  Visited With Patient not available  Visit Type Code  Referral From Nurse  Consult/Referral To Chaplain  Stress Factors  Patient Stress Factors Health changes

## 2018-05-11 NOTE — Progress Notes (Signed)
2 Orders placed for potassium replacement. Verified orders with Elink/CCM. Pt is due to receive a total of 5 runs of potassium. RN will continue to monitor pt closely.

## 2018-05-12 DIAGNOSIS — I4821 Permanent atrial fibrillation: Secondary | ICD-10-CM

## 2018-05-12 DIAGNOSIS — I5043 Acute on chronic combined systolic (congestive) and diastolic (congestive) heart failure: Secondary | ICD-10-CM

## 2018-05-12 DIAGNOSIS — E162 Hypoglycemia, unspecified: Secondary | ICD-10-CM

## 2018-05-12 LAB — COMPREHENSIVE METABOLIC PANEL
ALT: 47 U/L — ABNORMAL HIGH (ref 0–44)
AST: 125 U/L — ABNORMAL HIGH (ref 15–41)
Albumin: 3 g/dL — ABNORMAL LOW (ref 3.5–5.0)
Alkaline Phosphatase: 53 U/L (ref 38–126)
Anion gap: 13 (ref 5–15)
BUN: 35 mg/dL — ABNORMAL HIGH (ref 8–23)
CO2: 21 mmol/L — ABNORMAL LOW (ref 22–32)
Calcium: 9 mg/dL (ref 8.9–10.3)
Chloride: 112 mmol/L — ABNORMAL HIGH (ref 98–111)
Creatinine, Ser: 1.31 mg/dL — ABNORMAL HIGH (ref 0.61–1.24)
GFR calc Af Amer: 57 mL/min — ABNORMAL LOW (ref >60)
GFR calc non Af Amer: 49 mL/min — ABNORMAL LOW (ref >60)
Glucose, Bld: 78 mg/dL (ref 70–99)
Potassium: 4 mmol/L (ref 3.5–5.1)
Sodium: 146 mmol/L — ABNORMAL HIGH (ref 135–145)
Total Bilirubin: 1.2 mg/dL (ref 0.3–1.2)
Total Protein: 5.7 g/dL — ABNORMAL LOW (ref 6.5–8.1)

## 2018-05-12 LAB — GLUCOSE, CAPILLARY
Glucose-Capillary: 89 mg/dL (ref 70–99)
Glucose-Capillary: 92 mg/dL (ref 70–99)
Glucose-Capillary: 92 mg/dL (ref 70–99)
Glucose-Capillary: 85 mg/dL (ref 70–99)
Glucose-Capillary: 83 mg/dL (ref 70–99)
Glucose-Capillary: 88 mg/dL (ref 70–99)

## 2018-05-12 LAB — CBC
HCT: 35.9 % — ABNORMAL LOW (ref 39.0–52.0)
Hemoglobin: 11.8 g/dL — ABNORMAL LOW (ref 13.0–17.0)
MCH: 30.8 pg (ref 26.0–34.0)
MCHC: 32.9 g/dL (ref 30.0–36.0)
MCV: 93.7 fL (ref 80.0–100.0)
Platelets: 149 10*3/uL — ABNORMAL LOW (ref 150–400)
RBC: 3.83 MIL/uL — ABNORMAL LOW (ref 4.22–5.81)
RDW: 15.1 % (ref 11.5–15.5)
WBC: 9.5 10*3/uL (ref 4.0–10.5)
nRBC: 0 % (ref 0.0–0.2)

## 2018-05-12 LAB — MAGNESIUM: Magnesium: 2.1 mg/dL (ref 1.7–2.4)

## 2018-05-12 LAB — PHOSPHORUS: Phosphorus: 2.7 mg/dL (ref 2.5–4.6)

## 2018-05-12 LAB — TROPONIN I: Troponin I: 19.16 ng/mL (ref <0.03)

## 2018-05-12 MED ORDER — ORAL CARE MOUTH RINSE
15.0000 mL | Freq: Two times a day (BID) | OROMUCOSAL | Status: DC
  Administered 2018-05-12: 15 mL via OROMUCOSAL

## 2018-05-12 MED ORDER — CARVEDILOL 3.125 MG PO TABS
3.1250 mg | ORAL_TABLET | Freq: Two times a day (BID) | ORAL | Status: DC
  Administered 2018-05-13 – 2018-05-14 (×2): 3.125 mg via ORAL
  Filled 2018-05-12 (×3): qty 1

## 2018-05-12 MED ORDER — FUROSEMIDE 10 MG/ML IJ SOLN
40.0000 mg | Freq: Once | INTRAMUSCULAR | Status: AC
  Administered 2018-05-12: 40 mg via INTRAVENOUS
  Filled 2018-05-12: qty 4

## 2018-05-12 MED ORDER — SODIUM CHLORIDE 0.45 % IV SOLN
INTRAVENOUS | Status: DC
  Administered 2018-05-12 – 2018-05-14 (×3): via INTRAVENOUS

## 2018-05-12 MED ORDER — ORAL CARE MOUTH RINSE
15.0000 mL | Freq: Two times a day (BID) | OROMUCOSAL | Status: DC
  Administered 2018-05-13: 15 mL via OROMUCOSAL

## 2018-05-12 MED ORDER — ATORVASTATIN CALCIUM 40 MG PO TABS
40.0000 mg | ORAL_TABLET | Freq: Every day | ORAL | Status: DC
  Administered 2018-05-13: 40 mg via ORAL
  Filled 2018-05-12: qty 1

## 2018-05-12 MED ORDER — ASPIRIN 81 MG PO CHEW
81.0000 mg | CHEWABLE_TABLET | Freq: Every day | ORAL | Status: DC
  Administered 2018-05-14: 10:00:00 81 mg via ORAL
  Filled 2018-05-12 (×2): qty 1

## 2018-05-12 MED ORDER — DEXTROSE 10 % IV SOLN
INTRAVENOUS | Status: DC
  Administered 2018-05-12 – 2018-05-14 (×3): via INTRAVENOUS

## 2018-05-12 MED ORDER — CHLORHEXIDINE GLUCONATE 0.12 % MT SOLN
15.0000 mL | Freq: Two times a day (BID) | OROMUCOSAL | Status: DC
  Administered 2018-05-12 – 2018-05-14 (×4): 15 mL via OROMUCOSAL
  Filled 2018-05-12 (×5): qty 15

## 2018-05-12 MED ORDER — SACUBITRIL-VALSARTAN 24-26 MG PO TABS
1.0000 | ORAL_TABLET | Freq: Two times a day (BID) | ORAL | Status: DC
  Administered 2018-05-13 – 2018-05-14 (×2): 1 via ORAL
  Filled 2018-05-12 (×5): qty 1

## 2018-05-12 MED ORDER — CHLORHEXIDINE GLUCONATE 0.12 % MT SOLN: 15.0000 mL | Freq: Two times a day (BID) | OROMUCOSAL | Status: DC

## 2018-05-12 NOTE — Evaluation (Addendum)
Physical Therapy Evaluation Patient Details Name: Tyler Carr MRN: [id number] DOB: 10/01/1930 Today's Date: 05/12/2018   History of Present Illness  Pt is an 85 yo male admitted on 4/25 for unintentional drug overdose, who then suffered cardiac arrest 4/26 (PEA x12 minutes). He was briefly intubated (<24 hours). PMH includes: CHF, renal artery stenosis, HTN, chronic bradycardia, HLD, afib   Clinical Impression  Pt admitted with above diagnosis. Pt currently with functional limitations due to the deficits listed below (see PT Problem List). Pt was able to sit EOB for 8 min with min guard assist. Pt HR 124-147 bpm sitting EOB.  98% on 2L O2, 124/69.  Pt progress limited by poor cognition.  Pt perseverates stating his name for every question PT asked after asking his name.  Hx of dementia. Will need 24 hour care and if family cannot provide this, will need SNF.   Pt will benefit from skilled PT to increase their independence and safety with mobility to allow discharge to the venue listed below.      Follow Up Recommendations Home health PT;Supervision/Assistance - 24 hour(if family can't provide 24 hour care, will need SNF)    Equipment Recommendations  None recommended by PT    Recommendations for Other Services       Precautions / Restrictions Precautions Precautions: Fall Restrictions Weight Bearing Restrictions: No      Mobility  Bed Mobility Overal bed mobility: Needs Assistance Bed Mobility: Supine to Sit     Supine to sit: Min assist     General bed mobility comments: Assist and cues to come to EOB with pt lifiting hands and keeping arms lifted even though PT tried to cue to place hands at side.  He sat at EOB with LEs and arms extended and just not following to relax and just sit.    Transfers                 General transfer comment: Did not attempt as pt not following commands well.    Ambulation/Gait                Stairs            Wheelchair  Mobility    Modified Rankin (Stroke Patients Only)       Balance Overall balance assessment: Needs assistance Sitting-balance support: No upper extremity supported;Feet supported Sitting balance-Leahy Scale: Fair Sitting balance - Comments: sat EOB x 8 min with min guard assist                                     Pertinent Vitals/Pain Pain Assessment: No/denies pain Faces Pain Scale: No hurt    Home Living Family/patient expects to be discharged to:: Private residence   Available Help at Discharge: Family;Available 24 hours/day Type of Home: House Home Access: Level entry     Home Layout: Two level Home Equipment: None Additional Comments: Per nurse, pt and wife have moved in with daughter.      Prior Function Level of Independence: Needs assistance   Gait / Transfers Assistance Needed: Wife provided supervision for mobility. Reports patient did not use AD           Hand Dominance        Extremity/Trunk Assessment   Upper Extremity Assessment Upper Extremity Assessment: Defer to OT evaluation    Lower Extremity Assessment Lower Extremity Assessment: Generalized weakness;Difficult to assess due  to impaired cognition;RLE deficits/detail;LLE deficits/detail RLE Deficits / Details: pt extends and does not follwo commands to complete assessment LLE Deficits / Details: pt extends and does not follwo commands to complete assessment     Cervical / Trunk Assessment Cervical / Trunk Assessment: Kyphotic  Communication   Communication: No difficulties  Cognition Arousal/Alertness: Awake/alert Behavior During Therapy: Flat affect Overall Cognitive Status: History of cognitive impairments - at baseline Area of Impairment: Awareness;Problem solving;Safety/judgement;Orientation;Attention;Memory                 Orientation Level: Disoriented to;Place;Time;Situation Current Attention Level: Focused Memory: Decreased short-term memory;Decreased  recall of precautions   Safety/Judgement: Decreased awareness of safety;Decreased awareness of deficits   Problem Solving: Slow processing;Requires verbal cues;Decreased initiation;Requires tactile cues General Comments: Pt states his name over and over when asked other questions after asking him what his name was.        General Comments      Exercises General Exercises - Lower Extremity Ankle Circles/Pumps: AROM;Both;10 reps;Supine Quad Sets: AROM;Both;10 reps;Supine   Assessment/Plan    PT Assessment Patient needs continued PT services  PT Problem List Decreased activity tolerance;Decreased balance;Decreased mobility;Decreased strength;Decreased knowledge of use of DME;Decreased safety awareness;Decreased knowledge of precautions;Cardiopulmonary status limiting activity       PT Treatment Interventions DME instruction;Gait training;Functional mobility training;Therapeutic activities;Therapeutic exercise;Balance training;Patient/family education    PT Goals (Current goals can be found in the Care Plan section)  Acute Rehab PT Goals Patient Stated Goal: unable to state PT Goal Formulation: Patient unable to participate in goal setting Time For Goal Achievement: 05/26/18 Potential to Achieve Goals: Good    Frequency Min 3X/week   Barriers to discharge        Co-evaluation               AM-PAC PT "6 Clicks" Mobility  Outcome Measure Help needed turning from your back to your side while in a flat bed without using bedrails?: A Lot Help needed moving from lying on your back to sitting on the side of a flat bed without using bedrails?: A Lot Help needed moving to and from a bed to a chair (including a wheelchair)?: Total Help needed standing up from a chair using your arms (e.g., wheelchair or bedside chair)?: Total Help needed to walk in hospital room?: Total Help needed climbing 3-5 steps with a railing? : Total 6 Click Score: 8    End of Session Equipment  Utilized During Treatment: Gait belt;Oxygen Activity Tolerance: Patient limited by fatigue Patient left: in bed;with call bell/phone within reach;with bed alarm set Nurse Communication: Mobility status;Need for lift equipment PT Visit Diagnosis: Unsteadiness on feet (R26.81);Muscle weakness (generalized) (M62.81)    Time: 1048-1100 PT Time Calculation (min) (ACUTE ONLY): 12 min   Charges:   PT Evaluation $PT Eval Moderate Complexity: 1 Mod          Ruthie Berch,PT Acute Rehabilitation Services Pager:  [phone]  Office:  [phone]    Berline Lopes 05/12/2018, 12:42 PM

## 2018-05-12 NOTE — Consult Note (Addendum)
Cardiology Consultation:   Patient ID: Tyler Carr; [id number]; 1930/08/22   Admit date: 05/09/2018 Date of Consult: 05/12/2018  Primary Care Provider: Patient, No Pcp Per Primary Cardiologist: [facility] Primary Electrophysiologist:  None   Patient Profile:   Tyler Carr is a 85 y.o. male with a PMH of  Diastolic CHF (EF 40% 01/2018), permanent atrial fibrillation with tachy-brady syndrome, HTN s/p left renal artery stenting, HLD, CKD stage 3, and dementia, who is being seen today for the evaluation of PEA arrest/NSTEMI at the request of Dr. Lucinda Dell.  History of Present Illness:   Tyler Carr presented 05/09/2018 after an accidental overdose. He has a history of dementia and his wife manages his medications. On the morning of 05/09/2018, his wife was organizing his medications for the week and after stepping away for a minute, came back to find that the patient had mistakenly taken a whole weeks worth supply of his medications (aspirin, hydralazine, carvedilol, amlodipine, lasix, flomax, eliquis, and namenda). He was found to be hypotensive and profoundly bradycardic (HR in the 20s) on arrival. He was given given sorbitol and activated charcoal and admitted for supportive care. On the evening of 05/10/2018 he was found to be hypoxic with vascular congestion of CXR and metabolic acidosis on ABG. He was given IV lasix, however shortly after he had PEA arrest with ROSC achieved after 12 minutes with 3 rounds of epi. He was transferred to the ICU for ongoing care. Troponin peaked at 28.68 and trended down. EKG without STE but with chronic anterior q waves. Echo 05/11/2018 with EF 30% (previously 50% 01/2018) and severe hypokinesis of the LV apex with relative sparing of the base. Patient was extubated 05/11/2018. Cardiology asked to evaluate for PEA arrest.  He was last seen by [facility] during admission 01/2018 for symptomatic bradycardia. He had known permanent atrial fibrillation c/b  tachy-brady syndrome. He had reportedly refused PPM in the past and ultimately tolerated low dose BBlocker for rate control of his atrial fibrillation. Prior to this admission his last echo was 01/2018 which revealed EF 50%, diffuse mild hypokinesis, severe biatrial enlargement, and moderate LVH. He has no prior heart disease history and no ischemic evaluation in epic. According to notes he receives his cardiac [facility] at the [facility] but did undergo stenting of his L renal artery with Dr. Myra Gianotti in 1/20.   Past Medical History:  Diagnosis Date   CHF (congestive heart failure) (HCC)    Dementia (HCC)    Hyperlipidemia    Hypertension     Past Surgical History:  Procedure Laterality Date   ABDOMINAL AORTOGRAM N/A 02/04/2018   Procedure: ABDOMINAL AORTOGRAM;  Surgeon: Nada Libman, MD;  Location: [facility] INVASIVE CV LAB;  Service: Cardiovascular;  Laterality: N/A;   HERNIA REPAIR     PERIPHERAL VASCULAR INTERVENTION Left 02/04/2018   Procedure: PERIPHERAL VASCULAR INTERVENTION;  Surgeon: Nada Libman, MD;  Location: [facility] INVASIVE CV LAB;  Service: Cardiovascular;  Laterality: Left;  Renal artery     Home Medications:  Prior to Admission medications   Medication Sig Start Date End Date Taking? Authorizing Provider  amLODipine (NORVASC) 5 MG tablet Take 1 tablet (5 mg total) by mouth daily. 02/06/18  Yes Mikhail, Nita Sells, DO  apixaban (ELIQUIS) 5 MG TABS tablet Take 5 mg by mouth 2 (two) times daily.   Yes [provider]  aspirin EC 81 MG EC tablet Take 1 tablet (81 mg total) by mouth daily. 02/06/18  Yes Mikhail, Nita Sells, DO  brimonidine (ALPHAGAN) 0.15 % ophthalmic solution Place 1 drop into both eyes 2 (two) times a day.   Yes [provider]  carvedilol (COREG) 25 MG tablet Take 12.5 mg by mouth 2 (two) times daily.   Yes [provider]  dorzolamide-timolol (COSOPT) 22.3-6.8 MG/ML ophthalmic solution Place 1 drop into both eyes 2 (two) times daily.   Yes [provider]  furosemide (LASIX) 40 MG tablet Take 40 mg by mouth daily.   Yes [provider]  hydrALAZINE (APRESOLINE) 50 MG tablet Take 1 tablet (50 mg total) by mouth every 8 (eight) hours. 02/05/18  Yes Mikhail, Nita Sells, DO  lisinopril (PRINIVIL,ZESTRIL) 40 MG tablet Take 40 mg by mouth daily.   Yes [provider]  memantine (NAMENDA) 10 MG tablet Take 5 mg by mouth See admin instructions. Titration: Take 5 mg by mouth at bedtime for 2 weeks, then 10 mg at bedtime for 2 weeks, then 15 mg at bedtime for 2 weeks, then 20 mg at bedtime (thereafter) 04/21/18  Yes [provider]  PRESCRIPTION MEDICATION 1 application See admin instructions. [employer]- Apply as directed to bilateral legs/waist area every morning for a period of 1 hour   Yes [provider]  simvastatin (ZOCOR) 40 MG tablet Take 20 mg by mouth at bedtime.   Yes [provider]  tamsulosin (FLOMAX) 0.4 MG CAPS capsule Take 1 capsule (0.4 mg total) by mouth daily. 02/06/18  Yes Mikhail, Nita Sells, DO  Melatonin 3 MG TABS Take 3-6 mg by mouth at bedtime as needed (sleep and agitation).    [provider]    Inpatient Medications: Scheduled Meds:  aspirin  81 mg Oral Daily   [START ON 05/13/2018] chlorhexidine  15 mL Mouth Rinse BID   Chlorhexidine Gluconate Cloth  6 each Topical Daily   enoxaparin (LOVENOX) injection  70 mg Subcutaneous Q12H   insulin aspart  0-9 Units Subcutaneous Q4H   mouth rinse  15 mL Mouth Rinse q12n4p   pantoprazole (PROTONIX) IV  40 mg Intravenous Daily   tamsulosin  0.4 mg Oral QPC supper   Continuous Infusions:  sodium chloride 10 mL/hr at 05/12/18 0800   dexmedetomidine (PRECEDEX) IV infusion Stopped (05/12/18 0625)   dextrose 20 mL/hr at 05/12/18 0851   PRN Meds: sodium chloride, hydrALAZINE, ipratropium-albuterol, ondansetron **OR** ondansetron (ZOFRAN) IV, sodium chloride flush  Allergies:   No Known  Allergies  Social History:   Social History   Socioeconomic History   Marital status: Married    Spouse name: Not on file   Number of children: Not on file   Years of education: Not on file   Highest education level: Not on file  Occupational History   Not on file  Social Needs   Financial resource strain: Patient refused   Food insecurity:    Worry: Patient refused    Inability: Patient refused   Transportation needs:    Medical: Patient refused    Non-medical: Patient refused  Tobacco Use   Smoking status: Never Smoker   Smokeless tobacco: Never Used  Substance and Sexual Activity   Alcohol use: Not Currently   Drug use: Not Currently   Sexual activity: Not Currently  Lifestyle   Physical activity:    Days per week: Patient refused    Minutes per session: Patient refused   Stress: Patient refused  Relationships   Social connections:    Talks on phone: Patient refused    Gets together: Patient refused  Attends religious service: Patient refused    Active member of club or organization: Patient refused    Attends meetings of clubs or organizations: Patient refused    Relationship status: Patient refused   Intimate partner violence:    Fear of current or ex partner: Patient refused    Emotionally abused: Patient refused    Physically abused: Patient refused    Forced sexual activity: Patient refused  Other Topics Concern   Not on file  Social History Narrative   Not on file    Family History:    Family History  Problem Relation Age of Onset   Stroke Mother      ROS:   Unable to obtain further ROS due to dementia/confusion   Physical Exam/Data:   Vitals:   05/12/18 0700 05/12/18 0733 05/12/18 0800 05/12/18 0900  BP: 131/74  135/77 127/68  Pulse: 78  85 88  Resp: (!) 21  (!) 22 20  Temp:  99.3 F (37.4 C)    TempSrc:  Axillary    SpO2: 99%  96% 98%  Weight:      Height:        Intake/Output Summary (Last 24 hours) at  05/12/2018 0924 Last data filed at 05/12/2018 0800 Gross per 24 hour  Intake 386.37 ml  Output 2710 ml  Net -2323.63 ml   Filed Weights   05/09/18 1314 05/11/18 0100 05/12/18 0105  Weight: 64.4 kg 72.1 kg 69 kg   Body mass index is 21.22 kg/m.   Physical Exam per MD:  Per MD eaxam  EKG:  The EKG was personally reviewed and demonstrates:  AF with anterior q waves and PVCs. Personally reviewed   Telemetry:  Telemetry was personally reviewed and demonstrates:  AF 90-110. Personally reviewed  Relevant CV Studies: Echocardiogram 05/11/2018: IMPRESSIONS    1. The left ventricle had a visually estimated ejection fraction of of 30%. The cavity size was normal. There is moderate asymmetric left ventricular hypertrophy.  2. Severe hypokinesis of the LV apex with relative sparing of the base.  3. The right ventricle has moderately reduced systolic function. The cavity was normal. There is no increase in right ventricular wall thickness.  4. Severe biatrial enlargement.  5. Mitral valve regurgitation is mild to moderate by color flow Doppler.  6. Tricuspid valve regurgitation is moderate.  7. Aortic valve regurgitation is mild by color flow Doppler.  8. 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  9. The inferior vena cava was dilated in size with <50% respiratory variability. Patient is on mechanical ventilator. 10. Trivial posterior pericardial effusion is present. 11. Moderate pleural effusion.  Laboratory Data:  Chemistry Recent Labs  Lab 05/11/18 0023 05/11/18 0217 05/11/18 0531 05/11/18 1915 05/12/18 0233  NA 144 146* 143  --  146*  K 3.1* 3.8 4.2 3.7 4.0  CL 111 110  --   --  112*  CO2 16* 17*  --   --  21*  GLUCOSE 236* 149*  --   --  78  BUN 26* 28*  --   --  35*  CREATININE 1.35* 1.22  --   --  1.31*  CALCIUM 8.8* 9.3  --   --  9.0  GFRNONAA 48* 54*  --   --  49*  GFRAA 55* >60  --   --  57*  ANIONGAP 17* 19*  --   --  13    Recent Labs  Lab  05/11/18 0023 05/11/18 0217 05/12/18  0233  PROT 6.6 6.6 5.7*  ALBUMIN 3.6 3.6 3.0*  AST 110* 119* 125*  ALT 48* 54* 47*  ALKPHOS 63 54 53  BILITOT 0.8 0.8 1.2   Hematology Recent Labs  Lab 05/11/18 0023 05/11/18 0217 05/11/18 0531 05/12/18 0233  WBC 11.6* 9.1  --  9.5  RBC 3.91* 3.92*  --  3.83*  HGB 11.9* 11.9* 11.2* 11.8*  HCT 37.0* 36.9* 33.0* 35.9*  MCV 94.6 94.1  --  93.7  MCH 30.4 30.4  --  30.8  MCHC 32.2 32.2  --  32.9  RDW 15.1 15.1  --  15.1  PLT 202 181  --  149*   Cardiac Enzymes Recent Labs  Lab 05/11/18 0217 05/11/18 0708 05/11/18 1318 05/12/18 0702  TROPONINI 10.78* 23.58* 28.68* 19.16*   No results for input(s): TROPIPOC in the last 168 hours.  BNPNo results for input(s): BNP, PROBNP in the last 168 hours.  DDimer No results for input(s): DDIMER in the last 168 hours.  Radiology/Studies:  Dg Abd 1 View  Result Date: 05/11/2018 CLINICAL DATA:  Encounter for orogastric tube placement. EXAM: ABDOMEN - 1 VIEW COMPARISON:  None. FINDINGS: Gas pattern is nonspecific. Orogastric tube tip lies in the LEFT upper quadrant, likely along the greater curvature. Massive cardiomegaly. IMPRESSION: Orogastric tube tip lies in the LEFT upper quadrant, likely along the greater curvature. Electronically Signed   By: Elsie Stain M.D.   On: 05/11/2018 07:01   Dg Chest Port 1 View  Result Date: 05/11/2018 CLINICAL DATA:  Endotracheal intubation EXAM: PORTABLE CHEST 1 VIEW COMPARISON:  05/10/2018 FINDINGS: Endotracheal tube tip is just below the clavicular heads, well above the carina. There is unchanged moderate cardiomegaly with central pulmonary vascular congestion. Small right pleural effusion. Left lung base is incompletely visualized. IMPRESSION: Radiographically appropriate position of endotracheal tube. Electronically Signed   By: Deatra Robinson M.D.   On: 05/11/2018 01:02   Dg Chest Port 1 View  Result Date: 05/10/2018 CLINICAL DATA:  Acute respiratory distress  EXAM: PORTABLE CHEST 1 VIEW COMPARISON:  05/10/2018 FINDINGS: Cardiomegaly with vascular congestion. Improving bibasilar opacities since prior study. Suspect small effusions. Left infrahilar opacity could reflect residual asymmetric edema or infection. IMPRESSION: Cardiomegaly, vascular congestion. Left infrahilar airspace disease could reflect asymmetric edema or infection. Small effusions. Electronically Signed   By: Charlett Nose M.D.   On: 05/10/2018 23:02   Dg Chest Port 1 View  Result Date: 05/10/2018 CLINICAL DATA:  Shortness of breath EXAM: PORTABLE CHEST 1 VIEW COMPARISON:  None. FINDINGS: Non inclusion of the lung bases. Cardiomegaly with vascular congestion. Diffuse interstitial opacity suspicious for edema. Probable pleural effusions. Basilar airspace disease. IMPRESSION: Incompletely included lung bases. Cardiomegaly with vascular congestion and diffuse interstitial edema. Suspect that there are small pleural effusions and basilar atelectasis or infiltrate. Electronically Signed   By: Jasmine Pang M.D.   On: 05/10/2018 18:48    Assessment and Plan:   1. PEA arrest: patient noted to be hypoxic with metabolic acidosis late 05/10/2018 with subsequent PEA arrest. He achieved ROSC after 12 minutes and 3 rounds of epi. Trop peaked at 28 and trended down. No prior ischemic evaluation in Epic and patient has no prior heart disease history. He follows at the [facility] for cardiology care. Echo this admission showed EF 30%, previously 50% 01/2018. He has a history of dementia with unclear baseline. Given age and comorbidities, he is not an ideal candidate for ischemic evaluation -   2. Acute on chronic combined CHF:  Echo this admission with EF 30% with diffuse apical hypokinesis; EF 50% 01/2018. Noted to be hypoxic with vascular congestion on CXR prior to PEA arrest. Likely volume overload was a result of IVF resuscitation in the setting of accidental overdose. He has been diuresed with IV lasix with net -2.1L  UOP in the past 25 hours and -1L this admission. Possible drop in EF is related to an ischemic event - no prior ischemic evaluations in Epic. Home carvedilol, hydralazine, and lisinopril on hold given intermittent hypotension.    3. Permanent atrial fibrillation with tachy-brady syndrome: patient presented with HR in the 20s after accidental overdose. HR stable over the past 24 hours with rates primarily in the 60s. Reported to have refused PPM in the past. Home carvedilol has been on hold. Currently on therapeutic lovenox for stroke ppx while NPO (failed swallow eval yesterday) - Continue to monitor on telemetry. No recent pauses.  - Continue lovenox for stroke ppx - can resume home eliquis once cleared to take po and no further procedures planned.   4. HTN: BP somewhat labile. Home medications on hold. Plan to discontinue amlodipine in the setting of worsening EF.  - He is s/p stenting of left renal artery in 1/20 - Continue to monitor BP and resume home hydralazine and carvedilol as tolerated  5. HLD: AST/ALT mildly elevated, likely 2/2 shock liver.  - Resume home statin once LFTs improve  6. CKD stage 3: Cr at baseline of 1.3 today; peaked at 1.4 on admission - Continue to monitor Cr closely with diuresis.  7. Dementia: unclear baseline. On namenda at home.    For questions or updates, please contact [facility] Please consult [email or web address] for contact info under Cardiology/STEMI.   Signed, Beatriz StallionKrista M. Kroeger, PA-C  05/12/2018 9:24 AM [phone]  Patient seen and examined with the above-signed Advanced Practice Provider and/or Housestaff. I personally reviewed laboratory data, imaging studies and relevant notes. I independently examined the patient and formulated the important aspects of the plan. I have edited the note to reflect any of my changes or salient points. I have personally discussed the plan with the patient and/or family.  85 y/o male with HTN (s/p recent stenting or  left renal artery 1/20), diastolic HF, permanent AF with tachy-brady syndrome (previous refused PPM) and dementia who was admitted with PEA arrest after taking too many of his cardiac medications.   Now extubated. BP stable. Course complicated by apparent NSTEMI with troponin 28. Echo with EF 30 % with apical HK. ECG with permanent AF and chronic anterior Qs.   On exam he is pleasantly confused and unable to give any meaningful history. Does not appear to be in pain or have SOB  JVP 6-7 Cor IRR no s3 Lungs CTA Ab soft NT nD Ext warm no c/c/e Neuro moves all 4 purposefully CN intact. Confused  Oriented x 0  Echo and ECG reviewed personally. 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   With his advanced dementia he is not candidate for CABG thus the only role for cath would be for symptom management. With lack of symptoms would treat with medical therapy including ASA, b-blocker (as HR tolerates), low-dose Entresto and statin. Will not add Plavix (and can stop ASA) as Eliquis will be restarted soon.  Currently no evidence of recurrent pause on tele and no indication  for PPM. Consider repeating limited echo in several weeks to reassess EF for possible Tako-Tsubo recovery.   Arvilla Meres, MD  12:47 PM

## 2018-05-12 NOTE — Progress Notes (Signed)
NAME:  Tyler Carr, MRN:  [id number], DOB:  06-07-1930, LOS: 3 ADMISSION DATE:  05/09/2018, CONSULTATION DATE:  Rapid response REFERRING MD:  Rapid response, CHIEF COMPLAINT:  Cardiac arrest   Brief History   85 year old male initially admitted for unintentional drug overdose of 1 week of chronic meds on 05/10/2018, transferred from a floor on 05/11/2018 with cardiac arrest.  PEA for 12 minutes and received 3 rounds of epi.  Intubated on 05/11/2018. Tolerated extubation on 05/11/2018.  2D echocardiogram was performed (EF of 30%, prev Echo 01/31/2018: ef 50%).  Early 05/12/2018 a.m. patient had oliguria and was given a one-time dose of 40 mg of Lasix IV. 1L diuresed s/p lasix.   History of present illness   85 year old male with history of chf, renal artery stenosis, htn, chronic bradycardia, hld, afib presented to hospital on 05/10/18 for drug overdose and tx to ICU on 05/11/18 with cardiac arrest.  Pt was admitetd 05/10/18 after being noted uintentionally taking all weeks meds in one sitting; including hydralazine, eliquis, coreg, norvasc, flomax, lasix, namenda.  The patient developed weakness and was taken to ed.  He was given sorbitol and activated charcoal.  Recommended for 24 hour surveillance.  RRT at 10:40 pm with hypoxia on 2L [place].  He was increased to 6L [place].  abg 7.21/49/56.  cxr with vascular congestion.  Given lasix, xanax.  sats improved to 90%.  RRT called 12am with cardiac arrest.  Pt was in pea for 12 minutes and received 3 rounds of epi.  Returned to slow afib.  Oxygen 100%.  Alert and agitated with ETT.  Started on propofol gtt  Past Medical History  chf, renal artery stenosis, htn, chronic bradycardia, hld, afib  Significant Hospital Events   Admit 05/10/2018 Rapid response transfer to ICU 12 minutes PEA 05/11/2018 ETT 05/11/18 >>> 05/12/2018  Consults:  None  Procedures:  ETT 05/11/18 >>>05/12/2018 Midline Single-lumen 05/11/2018 >>>  Significant Diagnostic Tests:   Bedside echo  05/11/18 >>>  Left Ventricle: The left ventricle has a visually estimated ejection fraction of of 30%. The cavity size was normal. There is moderate asymmetric left ventricular hypertrophy. Severe hypokinesis of the LV apex with relative sparing of the base.  Micro Data:  Urine Tox 05/11/2018 >>> negative   Antimicrobials:    Interim history/subjective:  No significant events overnight. Nursing staff reporting patient is plesantly confused.   Objective   Blood pressure 131/74, pulse 78, temperature 99.3 F (37.4 C), temperature source Axillary, resp. rate (!) 21, height 5\' 11"  (1.803 m), weight 69 kg, SpO2 99 %.    Vent Mode: CPAP;PSV FiO2 (%):  [40 %-50 %] 40 % PEEP:  [5 cmH20] 5 cmH20 Pressure Support:  [5 cmH20] 5 cmH20   Intake/Output Summary (Last 24 hours) at 05/12/2018 0808 Last data filed at 05/12/2018 0700 Gross per 24 hour  Intake 403.11 ml  Output 2535 ml  Net -2131.89 ml   Filed Weights   05/09/18 1314 05/11/18 0100 05/12/18 0105  Weight: 64.4 kg 72.1 kg 69 kg    Physical Exam  BP 131/74   Pulse 78   Temp 99.3 F (37.4 C) (Axillary)   Resp (!) 21   Ht 5\' 11"  (1.803 m)   Wt 69 kg   SpO2 99%   BMI 21.22 kg/m   GEN: alert, not oriented to person/place/time; pleasant , NAD, chronically ill, appears stated age HEENT:  Poor dentition, dry oral mucosa, PERRLA, slugglish pupils bilaterally, anicteric sclera RESP  Clear to  auscultation, no wheezes or rales, no accessory muscle use  CARD:  Reg irregular rhythm,  1+ Le peripheral edema, pulses intact: radial 2+ bilaterally, DP 2+ bilaterally GI:   Soft & nt; nml bowel sounds [place]: Warm bilaterally, no deformities or joint swelling noted.  Neuro: alert, not oriented to place/person/time, follows commands, plesant   Skin: Warm, no lesions or rashes   Resolved Hospital Problem list     Assessment & Plan:  85 year old male with history of chronic bradycardia, afib, chf presented with unintentional drug overdose (1  week of chronic meds) tx to icu for cardiac arrest. S/P extubation 05/11/2018. Planned transfer to floor today.   PEA Cardiac arrest in setting of hypoxia 05/11/18 - Troponin 28.68 2D echo 05/11/18 - worsened ef of 30%  P:  Telemetry  Cards to see today  Restart home ASA  Trend Troponin  Acute hypoxic respiratory failure: 05/11/18 - Tolerating extubation   P:  Monitor clinically  Continue O2 via   Transfer to floor today   Anion gap metabolic acidosis, lactic acidosis in setting of cardiac arrest 05/11/18 - lactic 2.1  P:  Trend lactic   Slow afib On Lovenox  Holding Coreg   P:  Cards to see today  Restart asa  Hold coreg  Cont lovenox per pharm   CHF; last EF 50% with diffuse hypokinesis Jan/2020 - Echo - EF 50 05/11/18 - beside Echo - EF 30   P:  Cards to see today   Altered Mental Status  Home med: Namenda Currently follows commands Unsure of baseline mental status  NPO - failed bedside swallow yesterday   P:  Continue to monitor clinically  Speech Swallow eval today  Nursing to follow up with family about patients basline mental status  Restart namenda when able  limit sedating medications as able  AKI 1L diuresis last night   lasix IV push 05/12/18 am   P:  Okay to pull foley today  Monitor urine output  Replete electrolytes as needed   Shock liver: P:  Trend Hepatic function panel   HTN 131/74 today   P:  PRN hydralyzine  Restart home meds after swallow eval   Hypoglycemia d/t NPO  cbg - 70 this am Failed bedside swallow by nursing on 05/11/18   P:  Start D10 fluids at 10-20 for supplementing blood sugars  Planned swallow eval today by speech   PCCM to sign off on 05/13/2018  Best practice:  Diet: NPO Pain/Anxiety/Delirium protocol (if indicated): n/a VAP protocol (if indicated): not indicated DVT prophylaxis: lovenox therapeutic for afib GI prophylaxis: protonix  Glucose control: SSI Mobility: bedrest Code Status: full  code Family Communication: last discussed on transfer to ICU AM of 4/26; family wants still full code   Disposition: ICU, transfer to floor today, PCCM to sign off on 05/13/2018  Labs   CBC: Recent Labs  Lab 05/09/18 1330 05/10/18 0447 05/11/18 0023 05/11/18 0217 05/11/18 0531 05/12/18 0233  WBC 4.5 6.5 11.6* 9.1  --  9.5  NEUTROABS 2.7  --   --  8.0*  --   --   HGB 10.9* 11.4* 11.9* 11.9* 11.2* 11.8*  HCT 33.0* 34.9* 37.0* 36.9* 33.0* 35.9*  MCV 94.0 92.1 94.6 94.1  --  93.7  PLT 156 180 202 181  --  149*    Basic Metabolic Panel: Recent Labs  Lab 05/09/18 1330 05/10/18 0447  05/11/18 0023 05/11/18 0217 05/11/18 0531 05/11/18 1915 05/12/18 0233  NA 144 141  --  144 146* 143  --  146*  K 3.6 2.9*   < > 3.1* 3.8 4.2 3.7 4.0  CL 110 109  --  111 110  --   --  112*  CO2 23 21*  --  16* 17*  --   --  21*  GLUCOSE 101* 136*  --  236* 149*  --   --  78  BUN 27* 25*  --  26* 28*  --   --  35*  CREATININE 1.44* 1.14  --  1.35* 1.22  --   --  1.31*  CALCIUM 9.3 9.2  --  8.8* 9.3  --   --  9.0  MG  --   --   --   --  2.2  --  2.0 2.1  PHOS  --   --   --   --  5.4*  --   --  2.7   < > = values in this interval not displayed.   GFR: Estimated Creatinine Clearance: 40.2 mL/min (A) (by C-G formula based on SCr of 1.31 mg/dL (H)). Recent Labs  Lab 05/10/18 0447 05/11/18 0023 05/11/18 0217 05/11/18 1006 05/12/18 0233  WBC 6.5 11.6* 9.1  --  9.5  LATICACIDVEN  --   --  5.1* 2.1*  --     Liver Function Tests: Recent Labs  Lab 05/09/18 1330 05/10/18 0447 05/11/18 0023 05/11/18 0217 05/12/18 0233  AST 18 24 110* 119* 125*  ALT 15 21 48* 54* 47*  ALKPHOS 49 56 63 54 53  BILITOT 0.8 0.7 0.8 0.8 1.2  PROT 6.1* 6.7 6.6 6.6 5.7*  ALBUMIN 3.4* 3.7 3.6 3.6 3.0*   No results for input(s): LIPASE, AMYLASE in the last 168 hours. Recent Labs  Lab 05/11/18 0217  AMMONIA 17    ABG    Component Value Date/Time   PHART 7.484 (H) 05/11/2018 0531   PCO2ART 28.3 (L)  05/11/2018 0531   PO2ART 129.0 (H) 05/11/2018 0531   HCO3 21.5 05/11/2018 0531   TCO2 22 05/11/2018 0531   ACIDBASEDEF 1.0 05/11/2018 0531   O2SAT 99.0 05/11/2018 0531     Coagulation Profile: Recent Labs  Lab 05/11/18 0217  INR 1.5*    Cardiac Enzymes: Recent Labs  Lab 05/11/18 0217 05/11/18 0708 05/11/18 1318  TROPONINI 10.78* 23.58* 28.68*    HbA1C: No results found for: HGBA1C  CBG: Recent Labs  Lab 05/11/18 1139 05/11/18 1630 05/11/18 1953 05/11/18 2323 05/12/18 0338  GLUCAP 87 81 88 89 92     Coral CeoBrian P Chasidy Janak, NP [facility]   Critical Care time: 30 min

## 2018-05-12 NOTE — Evaluation (Signed)
Clinical/Bedside Swallow Evaluation Patient Details  Name: Tyler GuardianMarvin Carr MRN: [id number] Date of Birth: [birth year]  Today's Date: 05/12/2018 Time: SLP Start Time (ACUTE ONLY): 1036 SLP Stop Time (ACUTE ONLY): 1048 SLP Time Calculation (min) (ACUTE ONLY): 12 min  Past Medical History:  Past Medical History:  Diagnosis Date  . CHF (congestive heart failure) (HCC)   . Dementia (HCC)   . Hyperlipidemia   . Hypertension    Past Surgical History:  Past Surgical History:  Procedure Laterality Date  . ABDOMINAL AORTOGRAM N/A 02/04/2018   Procedure: ABDOMINAL AORTOGRAM;  Surgeon: Nada LibmanBrabham, Vance W, MD;  Location: [facility];  Service: Cardiovascular;  Laterality: N/A;  . HERNIA REPAIR    . PERIPHERAL VASCULAR INTERVENTION Left 02/04/2018   Procedure: PERIPHERAL VASCULAR INTERVENTION;  Surgeon: Nada LibmanBrabham, Vance W, MD;  Location: [facility]AS[facility];  Service: Cardiovascular;  Laterality: Left;  Renal artery   HPI:  Pt is an 85 yo male admitted on 4/25 for unintentional drug overdose, who then suffered cardiac arrest 4/26 (PEA x12 minutes). He was briefly intubated (<24 hours). PMH includes: CHF, renal artery stenosis, HTN, chronic bradycardia, HLD, afib   Assessment / Plan / Recommendation Clinical Impression  Pt shows signs of a possible pharyngeal dysphagia, with multiple subswallows per bolus (upwards of 6-7 at times) and delayed throat clearing. His cognition also impacts his oral phase of swallowing, with Max cues provided for bolus acceptance (although with less cueing needed to initiate swallow once bolus was in his oral cavity). He is not yet ready for a PO diet. Recommend that he remain NPO with focus on thorough oral care and additional SLP f/u to assess for PO readiness versus need for MBS pending improvements in mentation.  SLP Visit Diagnosis: Dysphagia, unspecified (R13.10)    Aspiration Risk  Moderate aspiration risk    Diet Recommendation NPO   Medication Administration:  Via alternative means    Other  Recommendations Oral Care Recommendations: Oral care QID Other Recommendations: Have oral suction available   Follow up Recommendations Skilled Nursing facility      Frequency and Duration min 2x/week  2 weeks       Prognosis Prognosis for Safe Diet Advancement: Good Barriers to Reach Goals: Cognitive deficits      Swallow Study   General HPI: Pt is an 85 yo male admitted on 4/25 for unintentional drug overdose, who then suffered cardiac arrest 4/26 (PEA x12 minutes). He was briefly intubated (<24 hours). PMH includes: CHF, renal artery stenosis, HTN, chronic bradycardia, HLD, afib Type of Study: Bedside Swallow Evaluation Previous Swallow Assessment: none in chart Diet Prior to this Study: NPO Temperature Spikes Noted: No Respiratory Status: Nasal cannula History of Recent Intubation: Yes Length of Intubations (days): (<24 hours) Date extubated: 05/11/18 Behavior/Cognition: Alert;Cooperative;Requires cueing Oral Cavity Assessment: Dry Oral Care Completed by SLP: No Oral Cavity - Dentition: Adequate natural dentition Self-Feeding Abilities: Total assist Patient Positioning: Upright in bed Baseline Vocal Quality: Normal Volitional Cough: (not tested) Volitional Swallow: Unable to elicit    Oral/Motor/Sensory Function Overall Oral Motor/Sensory Function: (cognitively limited, appears symmetrical)   Ice Chips Ice chips: Impaired Presentation: Spoon Oral Phase Impairments: Poor awareness of bolus;Other (comment)(no attempts at mastication or manipulation)   Thin Liquid Thin Liquid: Impaired Presentation: Straw Oral Phase Impairments: Poor awareness of bolus Pharyngeal  Phase Impairments: Multiple swallows;Throat Clearing - Delayed    Nectar Thick Nectar Thick Liquid: Not tested   Honey Thick Honey Thick Liquid: Not tested   Puree Puree:  Impaired Presentation: Spoon Oral Phase Impairments: Poor awareness of bolus Pharyngeal Phase  Impairments: Multiple swallows;Throat Clearing - Delayed   Solid     Solid: Not tested      Tyler Carr Tyler Carr 05/12/2018,11:38 AM  Tyler Carr, M.A. CCC-SLP Acute Herbalist [phone] Office [phone]

## 2018-05-12 NOTE — Progress Notes (Signed)
Pt in no distress needing bipap at this time.  RT will continue to monitor. 

## 2018-05-12 NOTE — Progress Notes (Signed)
eLink Physician-Brief Progress Note Patient Name: Tyler Carr DOB: 1930-03-31 MRN: [id number]   Date of Service  05/12/2018  HPI/Events of Note  Oliguria - LVEF = 30% and Creatinine = 1.22   eICU Interventions  Will order: 1. Lasix 40 mg IV X 1 now.      Intervention Category Intermediate Interventions: Oliguria - evaluation and management  [name] 05/12/2018, 1:29 AM

## 2018-05-12 NOTE — Progress Notes (Signed)
Patient is confused with fluctuating responses   but pleasant updated daughter of new room and status. She status that this is his baseline and would like for him to return home if able to eat and walk Patient has a low grade  Temp, recheccked orally 98 blood sugar 85  no urine out bladder scan 71ml   IV fluids d5ns @20ml /hr.

## 2018-05-12 NOTE — Progress Notes (Signed)
CRITICAL VALUE ALERT  Critical Value:  Troponin 19.16  Date & Time Notied:  05/12/2018 @ 0910  Provider Notified: Samuella Cota MD  Orders Received/Actions taken: none - trending down

## 2018-05-13 DIAGNOSIS — I4891 Unspecified atrial fibrillation: Secondary | ICD-10-CM

## 2018-05-13 LAB — COMPREHENSIVE METABOLIC PANEL
ALT: 40 U/L (ref 0–44)
AST: 83 U/L — ABNORMAL HIGH (ref 15–41)
Albumin: 2.6 g/dL — ABNORMAL LOW (ref 3.5–5.0)
Alkaline Phosphatase: 52 U/L (ref 38–126)
Anion gap: 12 (ref 5–15)
BUN: 32 mg/dL — ABNORMAL HIGH (ref 8–23)
CO2: 20 mmol/L — ABNORMAL LOW (ref 22–32)
Calcium: 8.1 mg/dL — ABNORMAL LOW (ref 8.9–10.3)
Chloride: 112 mmol/L — ABNORMAL HIGH (ref 98–111)
Creatinine, Ser: 1.25 mg/dL — ABNORMAL HIGH (ref 0.61–1.24)
GFR calc Af Amer: >60 mL/min (ref >60)
GFR calc non Af Amer: 52 mL/min — ABNORMAL LOW (ref >60)
Glucose, Bld: 109 mg/dL — ABNORMAL HIGH (ref 70–99)
Potassium: 2.8 mmol/L — ABNORMAL LOW (ref 3.5–5.1)
Sodium: 144 mmol/L (ref 135–145)
Total Bilirubin: 1.2 mg/dL (ref 0.3–1.2)
Total Protein: 5.2 g/dL — ABNORMAL LOW (ref 6.5–8.1)

## 2018-05-13 LAB — GLUCOSE, CAPILLARY
Glucose-Capillary: 80 mg/dL (ref 70–99)
Glucose-Capillary: 90 mg/dL (ref 70–99)
Glucose-Capillary: 113 mg/dL — ABNORMAL HIGH (ref 70–99)
Glucose-Capillary: 96 mg/dL (ref 70–99)
Glucose-Capillary: 105 mg/dL — ABNORMAL HIGH (ref 70–99)
Glucose-Capillary: 91 mg/dL (ref 70–99)

## 2018-05-13 LAB — LACTIC ACID, PLASMA: Lactic Acid, Venous: 1.1 mmol/L (ref 0.5–1.9)

## 2018-05-13 LAB — MAGNESIUM: Magnesium: 1.8 mg/dL (ref 1.7–2.4)

## 2018-05-13 MED ORDER — POTASSIUM CHLORIDE CRYS ER 20 MEQ PO TBCR: 40.0000 meq | EXTENDED_RELEASE_TABLET | ORAL | Status: DC

## 2018-05-13 MED ORDER — POTASSIUM CHLORIDE 10 MEQ/100ML IV SOLN
10.0000 meq | INTRAVENOUS | Status: AC
  Administered 2018-05-13 (×2): 10 meq via INTRAVENOUS
  Filled 2018-05-13 (×2): qty 100

## 2018-05-13 MED ORDER — POTASSIUM CHLORIDE 20 MEQ/15ML (10%) PO SOLN
40.0000 meq | ORAL | Status: AC
  Administered 2018-05-13: 40 meq via ORAL
  Filled 2018-05-13 (×2): qty 30

## 2018-05-13 MED ORDER — METOPROLOL TARTRATE 5 MG/5ML IV SOLN: 5.0000 mg | INTRAVENOUS | Status: DC | PRN

## 2018-05-13 NOTE — Progress Notes (Signed)
Progress Note  Patient Name: Tyler Carr Date of Encounter: 05/13/2018  Primary Cardiologist: No primary care provider on file.   Subjective   No significant overnight events. Confused this morning.   Inpatient Medications    Scheduled Meds: . aspirin  81 mg Oral Daily  . atorvastatin  40 mg Oral q1800  . carvedilol  3.125 mg Oral BID WC  . chlorhexidine  15 mL Mouth Rinse BID  . Chlorhexidine Gluconate Cloth  6 each Topical Daily  . enoxaparin (LOVENOX) injection  70 mg Subcutaneous Q12H  . insulin aspart  0-9 Units Subcutaneous Q4H  . mouth rinse  15 mL Mouth Rinse q12n4p  . pantoprazole (PROTONIX) IV  40 mg Intravenous Daily  . potassium chloride  40 mEq Oral Q4H  . sacubitril-valsartan  1 tablet Oral BID  . tamsulosin  0.4 mg Oral QPC supper   Continuous Infusions: . sodium chloride 30 mL/hr at 05/12/18 1916  . sodium chloride Stopped (05/12/18 1014)  . dextrose 20 mL/hr at 05/12/18 1200  . potassium chloride     PRN Meds: sodium chloride, hydrALAZINE, ipratropium-albuterol, ondansetron **OR** ondansetron (ZOFRAN) IV, sodium chloride flush   Vital Signs    Vitals:   05/12/18 1625 05/12/18 2006 05/12/18 2330 05/13/18 0427  BP:  (!) 144/93 (!) 152/89   Pulse: 79 98 95   Resp: Temp: 98.9 F (37.2 C) (!) 97.4 F (36.3 C) 98.7 F (37.1 C)   TempSrc: Oral Oral Oral   SpO2: 98% 92% 95%   Weight:    68.2 kg  Height:        Intake/Output Summary (Last 24 hours) at 05/13/2018 0848 Last data filed at 05/13/2018 0536 Gross per 24 hour  Intake 744.76 ml  Output 575 ml  Net 169.76 ml   Last 3 Weights 05/13/2018 05/12/2018 05/11/2018  Weight (lbs) 150 lb 5.7 oz 152 lb 1.9 oz 158 lb 15.2 oz  Weight (kg) 68.2 kg 69 kg 72.1 kg      Telemetry    Atrial fibrillation heart rate 80 to 120 bpm - Personally Reviewed  ECG    No new ECG tracing today. - Personally Reviewed  Physical Exam  Physical Exam per MD: Elderly male, confused but answers simple  questions appropriately GEN: No acute distress.   Neck: No JVD Cardiac:  Irregularly irregular, tachycardic, 2/6 systolic murmur at the apex Respiratory: Clear to auscultation bilaterally. GI: Soft, nontender, non-distended  MS: No edema; No deformity. Neuro:  Nonfocal  Psych: Normal affect   Labs    Chemistry Recent Labs  Lab 05/11/18 0217 05/11/18 0531 05/11/18 1915 05/12/18 0233 05/13/18 0430  NA 146* 143  --  146* 144  K 3.8 4.2 3.7 4.0 2.8*  CL 110  --   --  112* 112*  CO2 17*  --   --  21* 20*  GLUCOSE 149*  --   --  78 109*  BUN 28*  --   --  35* 32*  CREATININE 1.22  --   --  1.31* 1.25*  CALCIUM 9.3  --   --  9.0 8.1*  PROT 6.6  --   --  5.7* 5.2*  ALBUMIN 3.6  --   --  3.0* 2.6*  AST 119*  --   --  125* 83*  ALT 54*  --   --  47* 40  ALKPHOS 54  --   --  53 52  BILITOT 0.8  --   --  1.2 1.2  GFRNONAA 54*  --   --  49* 52*  GFRAA >60  --   --  57* >60  ANIONGAP 19*  --   --  13 12     Hematology Recent Labs  Lab 05/11/18 0023 05/11/18 0217 05/11/18 0531 05/12/18 0233  WBC 11.6* 9.1  --  9.5  RBC 3.91* 3.92*  --  3.83*  HGB 11.9* 11.9* 11.2* 11.8*  HCT 37.0* 36.9* 33.0* 35.9*  MCV 94.6 94.1  --  93.7  MCH 30.4 30.4  --  30.8  MCHC 32.2 32.2  --  32.9  RDW 15.1 15.1  --  15.1  PLT 202 181  --  149*    Cardiac Enzymes Recent Labs  Lab 05/11/18 0217 05/11/18 0708 05/11/18 1318 05/12/18 0702  TROPONINI 10.78* 23.58* 28.68* 19.16*   No results for input(s): TROPIPOC in the last 168 hours.   BNPNo results for input(s): BNP, PROBNP in the last 168 hours.   DDimer No results for input(s): DDIMER in the last 168 hours.   Radiology    No results found.  Cardiac Studies   Echocardiogram 05/11/2018: 1. The left ventricle had a visually estimated ejection fraction of of 30%. The cavity size was normal. There is moderate asymmetric left ventricular hypertrophy.  2. Severe hypokinesis of the LV apex with relative sparing of the base.  3. The  right ventricle has moderately reduced systolic function. The cavity was normal. There is no increase in right ventricular wall thickness.  4. Severe biatrial enlargement.  5. Mitral valve regurgitation is mild to moderate by color flow Doppler.  6. Tricuspid valve regurgitation is moderate.  7. Aortic valve regurgitation is mild by color flow Doppler.  8. 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  9. The inferior vena cava was dilated in size with <50% respiratory variability. Patient is on mechanical ventilator. 10. Trivial posterior pericardial effusion is present. 11. Moderate pleural effusion.  Patient Profile   Mr. Tyler Carr is a 85 y.o. male with a history of diastolic CHF with EF of 50% in 01/2018, permanent atrial fibrillation with tachy-brady syndrome, hypertension s/p left renal artery stenting, hyperlipidemia, CKD stage III, and dementia, who was admitted on 05/09/2018 after an accidental overdose (he took a whole weeks worth of his home medications including Aspirin, Hydralazine, Carvedilol, Amlodipine, Lasix, Flomax, Eliquis, and Namenda). On the evening of 05/10/2018, he was found to be hypoxic with vascular congestion on chest x-ray and metabolic acidosis on ABG. He was given IV Lasix but shortly after had a PEA arrest with ROSC after 12 minutes with 3 rounds of Epinephrine. Troponin peaked at 28.68. Cardiology consulted for further evaluation.  Assessment & Plan    PEA Arrest Patient achieved ROSC after 12 minutes with 3 rounds of Epinephrine. Troponin peaked at 28.68 and has since trended down. EKG showed permanent atrial fibrillation with chronic anterior Q wave. No prior ischemic evaluation in Epic and patient has no known heart disease. [facility]. Echo this admission showed EF of 30%, down from 50% in 01/2018. Given age, dementia, and comorbidities, he is note a candidate for ischemic evaluation. Plan is to treat medically at this time with  Aspirin and statin. Will not add Plavix (and can stop ASA) as Eliquis will be restarted soon.  Acute on Chronic Combined CHF Echo this admission showed LVEF of 30% with diffuse apical hypokinesis. EF noted to be 50% in 01/2018. Patient was  noted to be hypoxic with vascular congestion on chest x-ray prior to PEA arrest. Volume load likely a result of IV fluid resuscitation in the setting of accidental overdose. Currently net negative 1 L since admission. Drop in EF and elevated troponin felt to be secondary to either NSTEMI or possibly stress-induced cardiomyopathy. Plan is to treat medically at this time with beta-blocker (as heart rate tolerates) and low-dose Entresto once patient  no longer NPO. Consider repeating limited Echo in several weeks to reassess EF.  Permanent Atrial Fibrillation with Tachy-Brady Syndrome Patient presented with heart rates in the 20's after accidental overdose. However, it looks like rates have been stable over the last 48 hours. Patient reportedly refused PPM in the past. Restart PO beta-blocker when able. Currently on Lovenox for stroke prevention. Restart home Eliquis once cleared to take PO.  Hypertension - BP has been mostly elevated over the last 24 hours with systolic BP often in the 150's. - Currently on IV Hydralazine as needed.  - Gradually start to resume home medications once cleared to take PO.  Otherwise, per primary team.  For questions or updates, please contact [facility] Please consult [email or web address] for contact info under     Signed, Corrin Parker, PA-C  05/13/2018, 8:48 AM    Patient seen, examined. Available data reviewed. Agree with findings, assessment, and plan as outlined by Marjie Skiff, PA-C.  The patient is independently interviewed and examined.  My examination findings are documented above and the physical examination section.  The patient is confused but pleasant this morning.  He denies any chest pain or shortness of breath.   Telemetry shows atrial fibrillation with modestly increased heart rates.  I agree with the plans as outlined above.  The patient will be restarted on his home dose of apixaban once he is taking oral medication.  He should be started back on his home regimen again when he is tolerating orals.  Will give him IV metoprolol as needed while he is n.p.o.  Otherwise plans as outlined above.  No other cardiac recommendations at this time.  The patient is not a candidate for any invasive evaluation because of advanced dementia and comorbid medical conditions.  [facility] will sign off.   Medication Recommendations:  See above. Apixaban, carvedilol, entresto when taking PO. IV metoprolol for HR > 120 bpm (will write orders) Other recommendations (labs, testing, etc):  none Follow up as an outpatient:  With PCP.  Cardiology care through the [facility] system.  Tonny Bollman, M.D. 05/13/2018 11:22 AM

## 2018-05-13 NOTE — Progress Notes (Signed)
PROGRESS NOTE    Tyler Carr  BJY:[id number] DOB: Jan 29, 1930 DOA: 05/09/2018 PCP: Patient, No Pcp Per   Brief Narrative: As HPI 85 year old male initially admitted for unintentional drug overdose of 1 week of chronic meds on 05/10/2018, transferred from a floor on 05/11/2018 with cardiac arrest.  PEA for 12 minutes and received 3 rounds of epi.  Intubated on 05/11/2018. Tolerated extubation on 05/11/2018.  2D echocardiogram was performed (EF of 30%, prev Echo 01/31/2018: ef 50%).  Early 05/12/2018 a.m. patient had oliguria and was given a one-time dose of 40 mg of Lasix IV. 1L diuresed s/p lasix.  Patient was transferred to [facility] service 4/28 patient remains confused but follows commands, n.p.o. pending speech eval. Cardiology following for elevated troponin PEA arrest. Subjective: Seen and examined.  Alert awake, oriented to self, confused why he is here. Denies any chest pain nausea vomiting. Mitten on the right arm  Assessment & Plan:   PEA arrest in the setting of hypoxia with ROSC after 12 minutes of CPR.  Elevated troponin could be either non-ST E MI or possibly stress-induced cardiomyopathy.  Noted cardiology recommendation and plan is to continue on medical treatment once able to take p.o. well and heart rate able to tolerate- startdbeta-blocker, low-dose Entresto.  Follow-up limited echo in several weeks for EF evaluation  Acute hypoxic respiratory failure.  Resolved, patient was extubated in the ICU.  Currently on room air.  Acute encephalopathy- patient follows commands but not oriented.  Unclear of his baseline mental status.  He is on Namenda at home. cont Supportive care initiate p.o. medications once cleared by speech.  PT OT.  Elevated troponin peaked 28.6.see above.  Non-ST elevation MI versus stress-induced cardiomyopathy.  Acute on chronic combined CHF with current EF 30%, was 50% on January 2020.  Stress-induced cardiomyopathy versus an ST elevation MI given elevated  troponin.  Plan is beta-blocker low-dose Entresto.  Permanent A. fib with tachybradycardia syndrome: Restart Coreg once able to take p.o. and monitor heart rate.  Continue Lovenox and resume home Eliquis once able to take p.o.  Essential hypertension : BP remains elevated, continue IV hydralazine for now and resume p.o. medication once cleared by speech  Drug overdose, accidental or unintentional, initial encounter.  Admitted 4/25 after being noted of unintentional taking of all his medication in 1 sitting including hydralazine, Eliquis, Coreg, Norvasc, Flomax, Lasix and Namenda, was given orbitol and activated charcoal in the ER.  Patient will need supervision at home with medication.  AKI creatinine improved.  Continue diuresis.  Hypoglycemia, from n.p.o.  Sugar stable, patient on dextrose fluid, speech following hopefully he will be able to take p.o.  DVT prophylaxis: sq lovenox BID Code Status:  Family Communication: will try to call family and update  Disposition Plan: remains inpatient pending clinical improvement.  Continue PT.   Consultants:  PCCM CARDIOLOGY  Procedures: Echocardiogram 05/11/2018: 1. The left ventricle had a visually estimated ejection fraction of of 30%. The cavity size was normal. There is moderate asymmetric left ventricular hypertrophy. 2. Severe hypokinesis of the LV apex with relative sparing of the base. 3. The right ventricle has moderately reduced systolic function. The cavity was normal. There is no increase in right ventricular wall thickness. 4. Severe biatrial enlargement. 5. Mitral valve regurgitation is mild to moderate by color flow Doppler. 6. Tricuspid valve regurgitation is moderate. 7. Aortic valve regurgitation is mild by color flow Doppler. 8. 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 9. The inferior vena  cava was dilated in size with <50% respiratory variability. Patient is on mechanical ventilator. 10. Trivial  posterior pericardial 11. Moderate pleural effusion. Antimicrobials: Anti-infectives (From admission, onward)   None       Objective: Vitals:   05/12/18 2006 05/12/18 2330 05/13/18 0427 05/13/18 0851  BP: (!) 144/93 (!) 152/89  (!) 156/88  Pulse: 98 95  92  Resp: 20 18  16   Temp: (!) 97.4 F (36.3 C) 98.7 F (37.1 C)  98.1 F (36.7 C)  TempSrc: Oral Oral  Oral  SpO2: 92% 95%  94%  Weight:   68.2 kg   Height:        Intake/Output Summary (Last 24 hours) at 05/13/2018 0935 Last data filed at 05/13/2018 0536 Gross per 24 hour  Intake 733.25 ml  Output 575 ml  Net 158.25 ml   Filed Weights   05/11/18 0100 05/12/18 0105 05/13/18 0427  Weight: 72.1 kg 69 kg 68.2 kg   Weight change: -0.8 kg  Body mass index is 20.97 kg/m.  Intake/Output from previous day: 04/27 0701 - 04/28 0700 In: 754.8 [I.V.:754.8] Out: 750 [Urine:750] Intake/Output this shift: No intake/output data recorded.  Examination:  General exam: Appears calm and comfortable,Not in distress, older fore the age HEENT:PERRL,Oral mucosa moist, Ear/Nose normal on gross exam Respiratory system: Bilateral equal air entry, normal vesicular breath sounds, no wheezes or crackles  Cardiovascular system: S1 & S2 heard,No JVD, murmurs. Gastrointestinal system: Abdomen is  soft, non tender, non distended, BS +  Nervous System:Alert and oriented x1. No focal on exam Extremities: No edema, no clubbing, distal peripheral pulses palpable. Skin: No rashes, lesions, no icterus MSK: Normal muscle bulk,tone ,power  Medications:  Scheduled Meds: . aspirin  81 mg Oral Daily  . atorvastatin  40 mg Oral q1800  . carvedilol  3.125 mg Oral BID WC  . chlorhexidine  15 mL Mouth Rinse BID  . Chlorhexidine Gluconate Cloth  6 each Topical Daily  . enoxaparin (LOVENOX) injection  70 mg Subcutaneous Q12H  . insulin aspart  0-9 Units Subcutaneous Q4H  . mouth rinse  15 mL Mouth Rinse q12n4p  . pantoprazole (PROTONIX) IV  40 mg  Intravenous Daily  . potassium chloride  40 mEq Oral Q4H  . sacubitril-valsartan  1 tablet Oral BID  . tamsulosin  0.4 mg Oral QPC supper   Continuous Infusions: . sodium chloride 30 mL/hr at 05/12/18 1916  . sodium chloride Stopped (05/12/18 1014)  . dextrose 20 mL/hr at 05/13/18 0908  . potassium chloride      Data Reviewed: I have personally reviewed following labs and imaging studies  CBC: Recent Labs  Lab 05/09/18 1330 05/10/18 0447 05/11/18 0023 05/11/18 0217 05/11/18 0531 05/12/18 0233  WBC 4.5 6.5 11.6* 9.1  --  9.5  NEUTROABS 2.7  --   --  8.0*  --   --   HGB 10.9* 11.4* 11.9* 11.9* 11.2* 11.8*  HCT 33.0* 34.9* 37.0* 36.9* 33.0* 35.9*  MCV 94.0 92.1 94.6 94.1  --  93.7  PLT 156 180 202 181  --  149*   Basic Metabolic Panel: Recent Labs  Lab 05/10/18 0447  05/11/18 0023 05/11/18 0217 05/11/18 0531 05/11/18 1915 05/12/18 0233 05/13/18 0430  NA 141  --  144 146* 143  --  146* 144  K 2.9*   < > 3.1* 3.8 4.2 3.7 4.0 2.8*  CL 109  --  111 110  --   --  112* 112*  CO2 21*  --  16* 17*  --   --  21* 20*  GLUCOSE 136*  --  236* 149*  --   --  78 109*  BUN 25*  --  26* 28*  --   --  35* 32*  CREATININE 1.14  --  1.35* 1.22  --   --  1.31* 1.25*  CALCIUM 9.2  --  8.8* 9.3  --   --  9.0 8.1*  MG  --   --   --  2.2  --  2.0 2.1 1.8  PHOS  --   --   --  5.4*  --   --  2.7  --    < > = values in this interval not displayed.   GFR: Estimated Creatinine Clearance: 41.7 mL/min (A) (by C-G formula based on SCr of 1.25 mg/dL (H)). Liver Function Tests: Recent Labs  Lab 05/10/18 0447 05/11/18 0023 05/11/18 0217 05/12/18 0233 05/13/18 0430  AST 24 110* 119* 125* 83*  ALT 21 48* 54* 47* 40  ALKPHOS 56 63 54 53 52  BILITOT 0.7 0.8 0.8 1.2 1.2  PROT 6.7 6.6 6.6 5.7* 5.2*  ALBUMIN 3.7 3.6 3.6 3.0* 2.6*   No results for input(s): LIPASE, AMYLASE in the last 168 hours. Recent Labs  Lab 05/11/18 0217  AMMONIA 17   Coagulation Profile: Recent Labs  Lab 05/11/18  0217  INR 1.5*   Cardiac Enzymes: Recent Labs  Lab 05/11/18 0217 05/11/18 0708 05/11/18 1318 05/12/18 0702  TROPONINI 10.78* 23.58* 28.68* 19.16*   BNP (last 3 results) No results for input(s): PROBNP in the last 8760 hours. HbA1C: No results for input(s): HGBA1C in the last 72 hours. CBG: Recent Labs  Lab 05/12/18 1606 05/12/18 2003 05/12/18 2329 05/13/18 0424 05/13/18 0845  GLUCAP 85 83 88 80 90   Lipid Profile: Recent Labs    05/11/18 0217  TRIG 37   Thyroid Function Tests: No results for input(s): TSH, T4TOTAL, FREET4, T3FREE, THYROIDAB in the last 72 hours. Anemia Panel: No results for input(s): VITAMINB12, FOLATE, FERRITIN, TIBC, IRON, RETICCTPCT in the last 72 hours. Sepsis Labs: Recent Labs  Lab 05/11/18 0217 05/11/18 1006 05/13/18 0430  LATICACIDVEN 5.1* 2.1* 1.1    Recent Results (from the past 240 hour(s))  MRSA PCR Screening     Status: None   Collection Time: 05/11/18 12:57 AM  Result Value Ref Range Status   MRSA by PCR NEGATIVE NEGATIVE Final    Comment:        The GeneXpert MRSA Assay (FDA approved for NASAL specimens only), is one component of a comprehensive MRSA colonization surveillance program. It is not intended to diagnose MRSA infection nor to guide or monitor treatment for MRSA infections. Performed at [facility], [address], [place], [place] [address]       Radiology Studies: No results found.    LOS: 4 days   Time spent: More than 50% of that time was spent in counseling and/or coordination of care.  Lanae Boast, MD [facility]  05/13/2018, 9:35 AM

## 2018-05-13 NOTE — Progress Notes (Signed)
Initial Nutrition Assessment  RD working remotely.  DOCUMENTATION CODES:   Not applicable  INTERVENTION:   -RD will follow for diet advancement and supplement as appropriate  NUTRITION DIAGNOSIS:   Inadequate oral intake related to inability to eat as evidenced by NPO status.  GOAL:   Patient will meet greater than or equal to 90% of their needs  MONITOR:   Diet advancement, Labs, Weight trends, Skin, I & O's  REASON FOR ASSESSMENT:   Low Braden    ASSESSMENT:   Tyler Carr is a 85 y.o. male with medical history significant of congestive heart failure, Renal artery stenosis, hypertension, chronic bradycardia, hyperlipidemia, mild memory impairment who was brought in by daughter secondary to drug overdose.  Patient has a pillbox that is being set up by his wife on a regular basis.  This morning his wife was away from the medications while setting it out on the table.  Patient mistakenly took a whole week's supply of his medication at once.  He is mainly on cardiovascular medications including hydralazine, Eliquis, Coreg, Norvasc, Flomax, Lasix and Namenda with aspirin.  Patient is currently awake and alert communicating but weak.  His blood pressure was in the 80s on arrival.  Poison control has been involved.  At this point he is being admitted for supportive care.  He is noted to have profound bradycardia with rates in the 20s.  He has previously refused a pacemaker.  Patient and daughter however currently interested in pacemaker if recommended..  Pt admitted with unintentional drug overdose.   4/26- intubated after rapid response secondary to respiratory insufficiency, cardiac arrest, extubated 4/27- s/p BSE- recommend continued NPO secondary to mentation, transferred from ICU to floor  Reviewed I/O's: +4.8 ml x 24 hours and -1 L since admission  UOP: 750 ml x 24 hours  Pt unable to provide reliable history secondary to dementia. Unable to obtain further  nutrition-related history at this time.   Pt currently NPO per SLP recommendations. SLP to re-evaluate for diet advancement and/or need for instrumental testing ince mentation has improved.   Prior to intubation, pt with poor oral intake; documented meal completion 25% (pt on a heart healthy diet prior to intubation). Given previous poor oral intake and dementia, suspect intake will be inadequate once diet is advanced; RD will order supplements with diet advancement.   Per wt hx, wt has been stable over the past 3 months.   Labs reviewed: K: 2.8 (on PO supplementation), CBGS: 80-88 (inpatient orders for glycemic control are 0-9 units insulin aspart every 4 hours).   NUTRITION - FOCUSED PHYSICAL EXAM:    Most Recent Value  Orbital Region  Unable to assess  Upper Arm Region  Unable to assess  Thoracic and Lumbar Region  Unable to assess  Buccal Region  Unable to assess  Temple Region  Unable to assess  Clavicle Bone Region  Unable to assess  Clavicle and Acromion Bone Region  Unable to assess  Scapular Bone Region  Unable to assess  Dorsal Hand  Unable to assess  Patellar Region  Unable to assess  Anterior Thigh Region  Unable to assess  Posterior Calf Region  Unable to assess  Edema (RD Assessment)  Unable to assess  Hair  Unable to assess  Eyes  Unable to assess  Mouth  Unable to assess  Skin  Unable to assess  Nails  Unable to assess       Diet Order:   Diet Order  Diet NPO time specified  Diet effective now              EDUCATION NEEDS:   Not appropriate for education at this time  Skin:  Skin Assessment: Reviewed RN Assessment  Last BM:  Unknown  Height:   Ht Readings from Last 1 Encounters:  05/09/18 5\' 11"  (1.803 m)    Weight:   Wt Readings from Last 1 Encounters:  05/13/18 68.2 kg    Ideal Body Weight:  78.2 kg  BMI:  Body mass index is 20.97 kg/m.  Estimated Nutritional Needs:   Kcal:  1700-1900  Protein:  80-95  grams  Fluid:  1.7-1.9 L    Tyler Carr A. Mayford Knife, RD, LDN, CDCES Registered Dietitian II Certified Diabetes Care and Education Specialist Pager: [phone] After hours Pager: [phone]

## 2018-05-13 NOTE — TOC Initial Note (Signed)
Transition of Care [facility](TOC) - Initial/Assessment Note    Patient Details  Name: Tyler Carr GuardianMarvin Lesch MRN: [id number] Date of Birth: 10/01/1930  Transition of Care [facility](TOC) CM/SW Contact:    Reola MosherChandler, Joesph Marcy L, RN,MHA,BSN Phone Number: [phone] 05/13/2018, 1:07 PM  Clinical Narrative:                 Patient confused, advanced Dementia; TCT spouse and daughter concerning DCP; [facility] [place] [facility], he gets his medication through the mail and [facility]; HHC choice offered, [facility] / [facility]; referral made as requested; they will provide 24 hr care - spouse, daughter and 2 college age granddaughter will be home to assist with the care. No DME needed at this time.  Expected Discharge Plan: Home w Home Health Services Barriers to Discharge: No Barriers Identified   Patient Goals and CMS Choice Patient states their goals for this hospitalization and ongoing recovery are:: to stay at home CMS Medicare.gov Compare Post Acute Care list provided to:: Patient Represenative (must comment)(daughter and spouse) Choice offered to / list presented to : Adult Children, Spouse  Expected Discharge Plan and Services Expected Discharge Plan: Home w Home Health Services In-house Referral: Nutrition, NA Discharge Planning Services: CM Consult Post Acute Care Choice: Home Health Living arrangements for the past 2 months: Single Family Home                 DME Arranged: N/A DME Agency: NA       HH Arranged: RN, OT, Nurse's Aide, PT, Social Work [facility] Date HH Agency Contacted: 05/13/18 Time HH Agency Contacted: 1305 [facility]  Prior Living Arrangements/Services Living arrangements for the past 2 months: Single Family Home Lives with:: Adult Children, Spouse Patient language and need for interpreter reviewed:: Yes Do you feel safe going back to the place where you live?: Yes       Need for Family Participation in Patient Care: Yes (Comment) Care giver support system in place?: Yes (comment)(spouse, daughter and 2 college age granddaughters)   Criminal Activity/Legal Involvement Pertinent to Current Situation/Hospitalization: No - Comment as needed  Activities of Daily Living      Permission Sought/Granted Permission sought to share information with : Case Manager Permission granted to share information with : Yes, Verbal Permission Granted  Share Information with NAME: pt confused; talked to daughter and spouse  Permission granted to share info w AGENCY: HHC agency        Emotional Assessment Appearance:: Developmentally appropriate Attitude/Demeanor/Rapport: Other (comment)(confused) Affect (typically observed): Unable to Assess Orientation: : Fluctuating Orientation (Suspected and/or reported Sundowners) Alcohol / Substance Use: Not Applicable Psych Involvement: No (comment)  Admission diagnosis:  Accidental overdose, initial encounter [T50.901A] Patient Active Problem List   Diagnosis Date Noted  . Cardiac arrest (HCC) 05/11/2018  . Drug overdose, accidental or unintentional, initial encounter 05/09/2018  . Hypotension 05/09/2018  . Accelerated hypertension   . Symptomatic bradycardia 01/30/2018  . CHF (congestive heart failure) (HCC)   . Hypertension   . Hyperlipidemia   . Atrial fibrillation with slow ventricular response (HCC)    PCP:  Patient, No Pcp Per Pharmacy:   [id number] [place]- [place], [place] - [address] [facility] [address] [address] [place] [id number] Phone: [phone] Fax: [phone]  [facility] [place] - [id number] [phone]  [facility] [place] [address] Phone: [phone] Fax: [phone]     Social Determinants of Health (SDOH) Interventions    Readmission Risk Interventions No flowsheet data  found.

## 2018-05-13 NOTE — Progress Notes (Signed)
eLink Physician-Brief Progress Note Patient Name: Tyler Carr DOB: 1930/11/29 MRN: [id number]   Date of Service  05/13/2018  HPI/Events of Note  Oliguria - Bladder scan with residual of about 400 mL.   eICU Interventions  Will order: 1. I/O Cath PRN.      Intervention Category Intermediate Interventions: Oliguria - evaluation and management  Sommer,Steven Eugene 05/13/2018, 12:04 AM

## 2018-05-13 NOTE — Progress Notes (Signed)
Pt has no output  since foley was pulled at 1030. Bladder scanned pt and showed 400 ml. Paged critical care and placed new orders for I&O cath PRN. Will continue to monitor.

## 2018-05-13 NOTE — Progress Notes (Signed)
Physical Therapy Treatment Patient Details Name: Tyler Carr MRN: [id number] DOB: 1930-04-16 Today's Date: 05/13/2018    History of Present Illness Pt is an 85 yo male admitted on 4/25 for unintentional drug overdose, who then suffered cardiac arrest 4/26 (PEA x12 minutes). He was briefly intubated (<24 hours). PMH includes: CHF, renal artery stenosis, HTN, chronic bradycardia, HLD, afib     PT Comments    Pt very pleasant and smiling throughout session. PT with improved mobility and able to tolerate gait and increased activity this session. PT educated for RW use and HEP although unable to demonstrate carryover. Pt continues to need 24hr supervision for D/C.  HR 115 with gait    Follow Up Recommendations  Home health PT;Supervision/Assistance - 24 hour     Equipment Recommendations  None recommended by PT    Recommendations for Other Services       Precautions / Restrictions Precautions Precautions: Fall Restrictions Weight Bearing Restrictions: No    Mobility  Bed Mobility Overal bed mobility: Needs Assistance Bed Mobility: Supine to Sit     Supine to sit: Min assist     General bed mobility comments: min assist to initiate movement, bring legs to eOb and elevate trunk from surface  Transfers Overall transfer level: Needs assistance   Transfers: Sit to/from Stand Sit to Stand: Min guard         General transfer comment: cues for hand placement and sequence with tactile cues to rise and initiate movement  Ambulation/Gait Ambulation/Gait assistance: Min guard Gait Distance (Feet): 300 Feet Assistive device: Rolling walker (2 wheeled) Gait Pattern/deviations: Step-through pattern;Decreased stride length;Trunk flexed   Gait velocity interpretation: >2.62 ft/sec, indicative of community ambulatory General Gait Details: cues for posture and proximity to rW, pt maintains flexed trunk too posterior with cues x 3 to avoid obstacles   Stairs              Wheelchair Mobility    Modified Rankin (Stroke Patients Only)       Balance Overall balance assessment: Needs assistance Sitting-balance support: No upper extremity supported;Feet supported Sitting balance-Leahy Scale: Fair     Standing balance support: Bilateral upper extremity supported Standing balance-Leahy Scale: Poor                              Cognition Arousal/Alertness: Awake/alert Behavior During Therapy: WFL for tasks assessed/performed Overall Cognitive Status: No family/caregiver present to determine baseline cognitive functioning Area of Impairment: Awareness;Problem solving;Safety/judgement;Orientation;Attention;Memory                 Orientation Level: Disoriented to;Time;Situation Current Attention Level: Selective Memory: Decreased short-term memory   Safety/Judgement: Decreased awareness of safety;Decreased awareness of deficits   Problem Solving: Slow processing;Requires verbal cues;Decreased initiation;Requires tactile cues General Comments: pt able to state his name and hospital but unaware of situation or time. following all single step commands at times with delay      Exercises General Exercises - Lower Extremity Long Arc Quad: AROM;15 reps;Seated;Both Hip Flexion/Marching: AROM;15 reps;Seated;Both    General Comments        Pertinent Vitals/Pain Pain Assessment: No/denies pain    Home Living                      Prior Function            PT Goals (current goals can now be found in the care plan section) Progress towards PT goals:  Progressing toward goals    Frequency           PT Plan Current plan remains appropriate    Co-evaluation              AM-PAC PT "6 Clicks" Mobility   Outcome Measure  Help needed turning from your back to your side while in a flat bed without using bedrails?: A Little Help needed moving from lying on your back to sitting on the side of a flat bed without  using bedrails?: A Little Help needed moving to and from a bed to a chair (including a wheelchair)?: A Little Help needed standing up from a chair using your arms (e.g., wheelchair or bedside chair)?: A Little Help needed to walk in hospital room?: A Little Help needed climbing 3-5 steps with a railing? : A Little 6 Click Score: 18    End of Session Equipment Utilized During Treatment: Gait belt Activity Tolerance: Patient tolerated treatment well Patient left: in chair;with call bell/phone within reach;with chair alarm set;with nursing/sitter in room Nurse Communication: Mobility status PT Visit Diagnosis: Unsteadiness on feet (R26.81);Muscle weakness (generalized) (M62.81)     Time: 1033-1050 PT Time Calculation (min) (ACUTE ONLY): 17 min  Charges:  $Gait Training: 8-22 mins                     Dionicia Cerritos Abner Greenspanabor Ruqaya Strauss, PT Acute Rehabilitation Services Pager: [phone] Office: [phone]    Joslyn Ramos B Camia Dipinto 05/13/2018, 11:05 AM

## 2018-05-13 NOTE — Plan of Care (Signed)
  Problem: Activity: Goal: Risk for activity intolerance will decrease Outcome: Progressing   Problem: Safety: Goal: Ability to remain free from injury will improve Outcome: Progressing   

## 2018-05-13 NOTE — Progress Notes (Signed)
  Speech Language Pathology Treatment: Dysphagia  Patient Details Name: Praveen Coia MRN: [id number] DOB: 04/26/1930 Today's Date: 05/13/2018 Time: 2778-2423 SLP Time Calculation (min) (ACUTE ONLY): 12 min  Assessment / Plan / Recommendation Clinical Impression  Pt requires cues and redirection but less confused than yesterday's initial evaluation. He readily accepted water, applesauce and solid with several delayed throat clears not suspected to be correlated with po's. Functional oral manipulation. No acute infarct, brief intubation with recommendation of regular texture, thin liquids, pills whole in puree and full assist due to confusion. Do not feel he needs continued ST. Will discharge and if difficulties arise, please reconsult.    HPI HPI: Pt is an 85 yo male admitted on 4/25 for unintentional drug overdose, who then suffered cardiac arrest 4/26 (PEA x12 minutes). He was briefly intubated (<24 hours). PMH includes: CHF, renal artery stenosis, HTN, chronic bradycardia, HLD, afib      SLP Plan  All goals met       Recommendations  Diet recommendations: Regular;Thin liquid Liquids provided via: Cup;Straw Medication Administration: Whole meds with puree Supervision: Patient able to self feed;Full supervision/cueing for compensatory strategies Compensations: Slow rate;Minimize environmental distractions;Small sips/bites Postural Changes and/or Swallow Maneuvers: Seated upright 90 degrees                Oral Care Recommendations: Oral care BID Follow up Recommendations: Skilled Nursing facility SLP Visit Diagnosis: Dysphagia, unspecified (R13.10) Plan: All goals met       GO                Houston Siren 05/13/2018, 3:27 PM   Orbie Pyo Colvin Caroli.Ed Risk analyst [phone] Office [phone]

## 2018-05-14 ENCOUNTER — Other Ambulatory Visit: Payer: Self-pay | Admitting: Student

## 2018-05-14 DIAGNOSIS — F0150 Vascular dementia without behavioral disturbance: Secondary | ICD-10-CM

## 2018-05-14 DIAGNOSIS — I5020 Unspecified systolic (congestive) heart failure: Secondary | ICD-10-CM

## 2018-05-14 DIAGNOSIS — I5043 Acute on chronic combined systolic (congestive) and diastolic (congestive) heart failure: Secondary | ICD-10-CM

## 2018-05-14 DIAGNOSIS — N179 Acute kidney failure, unspecified: Secondary | ICD-10-CM

## 2018-05-14 LAB — CBC WITH DIFFERENTIAL/PLATELET
Abs Immature Granulocytes: 0.02 10*3/uL (ref 0.00–0.07)
Basophils Absolute: 0 10*3/uL (ref 0.0–0.1)
Basophils Relative: 0 %
Eosinophils Absolute: 0.1 10*3/uL (ref 0.0–0.5)
Eosinophils Relative: 1 %
HCT: 39.2 % (ref 39.0–52.0)
Hemoglobin: 12.8 g/dL — ABNORMAL LOW (ref 13.0–17.0)
Immature Granulocytes: 0 %
Lymphocytes Relative: 14 %
Lymphs Abs: 1.2 10*3/uL (ref 0.7–4.0)
MCH: 30 pg (ref 26.0–34.0)
MCHC: 32.7 g/dL (ref 30.0–36.0)
MCV: 92 fL (ref 80.0–100.0)
Monocytes Absolute: 0.8 10*3/uL (ref 0.1–1.0)
Monocytes Relative: 9 %
Neutro Abs: 6.7 10*3/uL (ref 1.7–7.7)
Neutrophils Relative %: 76 %
Platelets: 186 10*3/uL (ref 150–400)
RBC: 4.26 MIL/uL (ref 4.22–5.81)
RDW: 14.6 % (ref 11.5–15.5)
WBC: 8.8 10*3/uL (ref 4.0–10.5)
nRBC: 0 % (ref 0.0–0.2)

## 2018-05-14 LAB — GLUCOSE, CAPILLARY
Glucose-Capillary: 101 mg/dL — ABNORMAL HIGH (ref 70–99)
Glucose-Capillary: 118 mg/dL — ABNORMAL HIGH (ref 70–99)
Glucose-Capillary: 125 mg/dL — ABNORMAL HIGH (ref 70–99)

## 2018-05-14 LAB — COMPREHENSIVE METABOLIC PANEL
ALT: 44 U/L (ref 0–44)
AST: 78 U/L — ABNORMAL HIGH (ref 15–41)
Albumin: 3.2 g/dL — ABNORMAL LOW (ref 3.5–5.0)
Alkaline Phosphatase: 57 U/L (ref 38–126)
Anion gap: 13 (ref 5–15)
BUN: 29 mg/dL — ABNORMAL HIGH (ref 8–23)
CO2: 23 mmol/L (ref 22–32)
Calcium: 9.3 mg/dL (ref 8.9–10.3)
Chloride: 110 mmol/L (ref 98–111)
Creatinine, Ser: 1.41 mg/dL — ABNORMAL HIGH (ref 0.61–1.24)
GFR calc Af Amer: 52 mL/min — ABNORMAL LOW (ref >60)
GFR calc non Af Amer: 45 mL/min — ABNORMAL LOW (ref >60)
Glucose, Bld: 144 mg/dL — ABNORMAL HIGH (ref 70–99)
Potassium: 3.6 mmol/L (ref 3.5–5.1)
Sodium: 146 mmol/L — ABNORMAL HIGH (ref 135–145)
Total Bilirubin: 1.1 mg/dL (ref 0.3–1.2)
Total Protein: 6.2 g/dL — ABNORMAL LOW (ref 6.5–8.1)

## 2018-05-14 LAB — MAGNESIUM: Magnesium: 2.1 mg/dL (ref 1.7–2.4)

## 2018-05-14 MED ORDER — GLUCERNA SHAKE PO LIQD: 237.0000 mL | Freq: Two times a day (BID) | ORAL | 0 refills | Status: DC

## 2018-05-14 MED ORDER — FUROSEMIDE 40 MG PO TABS: 40.0000 mg | ORAL_TABLET | Freq: Every day | ORAL | 0 refills | Status: DC | PRN

## 2018-05-14 MED ORDER — ENSURE ENLIVE PO LIQD
237.0000 mL | Freq: Three times a day (TID) | ORAL | Status: DC
  Administered 2018-05-14 (×2): 237 mL via ORAL

## 2018-05-14 MED ORDER — ADULT MULTIVITAMIN W/MINERALS CH: 1.0000 | ORAL_TABLET | Freq: Every day | ORAL | 0 refills | Status: DC

## 2018-05-14 MED ORDER — ATORVASTATIN CALCIUM 40 MG PO TABS: 40.0000 mg | ORAL_TABLET | Freq: Every day | ORAL | 0 refills | Status: DC

## 2018-05-14 MED ORDER — CARVEDILOL 3.125 MG PO TABS: 3.1250 mg | ORAL_TABLET | Freq: Two times a day (BID) | ORAL | 0 refills | Status: DC

## 2018-05-14 MED ORDER — ADULT MULTIVITAMIN W/MINERALS CH
1.0000 | ORAL_TABLET | Freq: Every day | ORAL | Status: DC
  Administered 2018-05-14: 1 via ORAL
  Filled 2018-05-14: qty 1

## 2018-05-14 MED ORDER — SACUBITRIL-VALSARTAN 24-26 MG PO TABS: 1.0000 | ORAL_TABLET | Freq: Two times a day (BID) | ORAL | 0 refills | Status: DC

## 2018-05-14 MED ORDER — GLUCERNA SHAKE PO LIQD: 237.0000 mL | Freq: Two times a day (BID) | ORAL | Status: DC

## 2018-05-14 NOTE — Progress Notes (Signed)
Pt had 1 urine and 1 bowel occurrence. Pt was bladder scanned after urine occurrence and pt has greater than 549 ml. [facility] paged and placed an order for a Urinary catheter for retention.

## 2018-05-14 NOTE — Progress Notes (Signed)
Physical Therapy Treatment Patient Details Name: Tyler Carr MRN: [id number] DOB: 19-Dec-1930 Today's Date: 05/14/2018    History of Present Illness Pt is an 85 yo male admitted on 4/25 for unintentional drug overdose, who then suffered cardiac arrest 4/26 (PEA x12 minutes). He was briefly intubated (<24 hours). PMH includes: CHF, renal artery stenosis, HTN, chronic bradycardia, HLD, afib     PT Comments    Pt very confused throughout, adamant that he was at his nephew's barbershop in [place] and becoming aggressive with therapist and floor staff. Pt limited mostly due to cognitive deficits. Plan is for pt to d/c home today with family; however, do not feel this is safe at this time due to pt's mental status, zero safety awareness and appears to be at an increased risk for falls at this time. Pt would continue to benefit from skilled physical therapy services at this time while admitted and after d/c to address the below listed limitations in order to improve overall safety and independence with functional mobility.    Follow Up Recommendations  SNF;Supervision/Assistance - 24 hour     Equipment Recommendations  None recommended by PT    Recommendations for Other Services       Precautions / Restrictions Precautions Precautions: Fall Precaution Comments: agitation Restrictions Weight Bearing Restrictions: No    Mobility  Bed Mobility Overal bed mobility: Needs Assistance Bed Mobility: Supine to Sit     Supine to sit: Min guard     General bed mobility comments: multimodal cueing needed to complete task  Transfers Overall transfer level: Needs assistance Equipment used: None Transfers: Sit to/from Stand Sit to Stand: Min guard         General transfer comment: min guard for safety  Ambulation/Gait Ambulation/Gait assistance: Min assist;+2 safety/equipment Gait Distance (Feet): 20 Feet Assistive device: Rolling walker (2 wheeled);None;1 person hand held  assist;2 person hand held assist Gait Pattern/deviations: Step-through pattern;Decreased stride length;Trunk flexed Gait velocity: decr   General Gait Details: pt very confused throughout and then becoming agitated during ambulation; attempted to use RW initially but pt unable to safely maintain RW and becoming agitated when therapist assisting.    Stairs             Wheelchair Mobility    Modified Rankin (Stroke Patients Only)       Balance Overall balance assessment: Needs assistance Sitting-balance support: Feet supported Sitting balance-Leahy Scale: Fair     Standing balance support: During functional activity;No upper extremity supported Standing balance-Leahy Scale: Poor                              Cognition Arousal/Alertness: Awake/alert Behavior During Therapy: Agitated Overall Cognitive Status: No family/caregiver present to determine baseline cognitive functioning Area of Impairment: Orientation;Attention;Memory;Following commands;Safety/judgement;Awareness;Problem solving                 Orientation Level: Disoriented to;Place;Time;Situation Current Attention Level: Focused Memory: Decreased short-term memory Following Commands: Follows one step commands inconsistently Safety/Judgement: Decreased awareness of safety;Decreased awareness of deficits Awareness: Intellectual Problem Solving: Slow processing;Difficulty sequencing;Requires verbal cues        Exercises      General Comments        Pertinent Vitals/Pain Pain Assessment: No/denies pain    Home Living                      Prior Function  PT Goals (current goals can now be found in the care plan section) Acute Rehab PT Goals PT Goal Formulation: Patient unable to participate in goal setting Time For Goal Achievement: 05/26/18 Potential to Achieve Goals: Fair Progress towards PT goals: Not progressing toward goals - comment(pt limited secondary  to agitation)    Frequency    Min 3X/week      PT Plan Discharge plan needs to be updated    Co-evaluation              AM-PAC PT "6 Clicks" Mobility   Outcome Measure  Help needed turning from your back to your side while in a flat bed without using bedrails?: A Little Help needed moving from lying on your back to sitting on the side of a flat bed without using bedrails?: A Little Help needed moving to and from a bed to a chair (including a wheelchair)?: A Little Help needed standing up from a chair using your arms (e.g., wheelchair or bedside chair)?: A Little Help needed to walk in hospital room?: A Little Help needed climbing 3-5 steps with a railing? : A Lot 6 Click Score: 17    End of Session   Activity Tolerance: Treatment limited secondary to agitation Patient left: with call bell/phone within reach;with nursing/sitter in room;Other (comment)(ambulating in room with RN and NT present) Nurse Communication: Mobility status PT Visit Diagnosis: Unsteadiness on feet (R26.81);Muscle weakness (generalized) (M62.81)     Time: [id number] PT Time Calculation (min) (ACUTE ONLY): 33 min  Charges:  $Therapeutic Activity: 23-37 mins                     Tyler ChalkJennifer Izaan [place], South CarolinaPT, DPT  Acute Rehabilitation Services Pager [phone] Office [phone]     Alessandra BevelsJennifer M Mills Mitton 05/14/2018, 4:02 PM

## 2018-05-14 NOTE — Progress Notes (Signed)
   Received call from Dr. Roda Shutters about helping arrange follow-up for patient. Patient previously followed by [facility] in [place]. However, patient has dementia and has recently moved to the area to live with his daughter. Daughter is not sure if they will [facility]. Patient was started on Entresto this admission, and we want to make sure patient follows up with Cardiology and is not lost to follow-up. Therefore, will have our schedulers contact patient's daughter and set up virtual visit in 2 weeks. If family decides to stay with the [facility], this appointment can be cancelled. Have also arranged for patient to have a lab visit on Friday to recheck BMET.  Corrin Parker, PA-C 05/14/2018 3:31 PM

## 2018-05-14 NOTE — Progress Notes (Signed)
Nutrition Follow-up  RD working remotely.  DOCUMENTATION CODES:   Not applicable  INTERVENTION:   -Ensure Enlive po TID, each supplement provides 350 kcal and 20 grams of protein -MVI with minerals daily  NUTRITION DIAGNOSIS:   Inadequate oral intake related to poor appetite as evidenced by meal completion < 25%.  Ongoing  GOAL:   Patient will meet greater than or equal to 90% of their needs  Progressing  MONITOR:   PO intake, Supplement acceptance, Labs, Weight trends, Skin, I & O's  REASON FOR ASSESSMENT:   Low Braden    ASSESSMENT:   Tyler Carr is a 85 y.o. male with medical history significant of congestive heart failure, Renal artery stenosis, hypertension, chronic bradycardia, hyperlipidemia, mild memory impairment who was brought in by daughter secondary to drug overdose.  Patient has a pillbox that is being set up by his wife on a regular basis.  This morning his wife was away from the medications while setting it out on the table.  Patient mistakenly took a whole week's supply of his medication at once.  He is mainly on cardiovascular medications including hydralazine, Eliquis, Coreg, Norvasc, Flomax, Lasix and Namenda with aspirin.  Patient is currently awake and alert communicating but weak.  His blood pressure was in the 80s on arrival.  Poison control has been involved.  At this point he is being admitted for supportive care.  He is noted to have profound bradycardia with rates in the 20s.  He has previously refused a pacemaker.  Patient and daughter however currently interested in pacemaker if recommended..  4/26- intubated after rapid response secondary to respiratory insufficiency, cardiac arrest, extubated 4/27- s/p BSE- recommend continued NPO secondary to mentation, transferred from ICU to floor 4/28- s/p BSE- advanced to regular diet with thin liquids; SLP sign off  Reviewed I/O's: +290 ml x 24 hours and -724 ml since admission  UOP: 453 ml x 24  hours  Per nursing notes, pt remains confused and orders have been placed for a Recruitment consultant. He has been advanced to a regular diet, but still eating very little (documented meal completion 25% this morning at breakfast). RD will add supplements to help promote adequate PO intake.   Per therapy notes, recommending home health therapies with 24 hour supervision.   Labs reviewed: CBGS: 91-118 (inpatient orders for glycemic control are 09 units insulin aspart every 4 hours).   Diet Order:   Diet Order            Diet regular Room service appropriate? Yes; Fluid consistency: Thin  Diet effective now              EDUCATION NEEDS:   Not appropriate for education at this time  Skin:  Skin Assessment: Reviewed RN Assessment  Last BM:  05/13/18  Height:   Ht Readings from Last 1 Encounters:  05/09/18 5\' 11"  (1.803 m)    Weight:   Wt Readings from Last 1 Encounters:  05/14/18 70.2 kg    Ideal Body Weight:  78.2 kg  BMI:  Body mass index is 21.59 kg/m.  Estimated Nutritional Needs:   Kcal:  1700-1900  Protein:  80-95 grams  Fluid:  1.7-1.9 L    Merdith Adan A. Mayford Knife, RD, LDN, CDCES Registered Dietitian II Certified Diabetes Care and Education Specialist Pager: [phone] After hours Pager: [phone]

## 2018-05-14 NOTE — TOC Transition Note (Signed)
Transition of Care [facility]) - CM/SW Discharge Note   Patient Details  Name: Tyler Carr MRN: [id number] Date of Birth: August 10, 1930  [facility] CM/SW Contact:  Reola Mosher Phone Number: [phone] 05/14/2018, 12:18 PM   Clinical Narrative:    Patient discharging home today with [facility] services provided by [facility] as requested; spouse/ daughter and granddaughters are to provide 24 hr care. HHC agency called and is aware of discharge home today.  Final next level of care: Home w Home Health Services Barriers to Discharge: No Barriers Identified   Patient Goals and CMS Choice Patient states their goals for this hospitalization and ongoing recovery are:: to stay at home CMS Medicare.gov Compare Post Acute Care list provided to:: Patient Represenative (must comment)(daughter and spouse) Choice offered to / list presented to : Adult Children, Spouse  Discharge Placement                       Discharge Plan and Services In-house Referral: Nutrition, NA Discharge Planning Services: CM Consult Post Acute Care Choice: Home Health          DME Arranged: N/A DME Agency: NA       HH Arranged: RN, OT, Nurse's Aide, PT, Social Work [employer] Agency: [employer] [facility] Date HH Agency Contacted: 05/13/18 Time HH Agency Contacted: 1305 Representative spoke with at [facility] Agency: [employer] rep  Social Determinants of Health (SDOH) Interventions     Readmission Risk Interventions No flowsheet data found.

## 2018-05-14 NOTE — Progress Notes (Signed)
ANTICOAGULATION CONSULT NOTE - Initial Consult  Pharmacy Consult for lovenox Indication: atrial fibrillation  No Known Allergies  Patient Measurements: Height: 5\' 11"  (180.3 cm) Weight: 154 lb 12.2 oz (70.2 kg) IBW/kg (Calculated) : 75.3  Vital Signs: Temp: 98.2 F (36.8 C) (04/29 0937) Temp Source: Oral (04/29 0937) BP: 156/85 (04/29 0937) Pulse Rate: 93 (04/29 0937)  Labs: Recent Labs    05/11/18 1318 05/12/18 0233 05/12/18 0702 05/13/18 0430 05/14/18 0835  HGB  --  11.8*  --   --  12.8*  HCT  --  35.9*  --   --  39.2  PLT  --  149*  --   --  186  CREATININE  --  1.31*  --  1.25* 1.41*  TROPONINI 28.68*  --  19.16*  --   --     Estimated Creatinine Clearance: 38 mL/min (A) (by C-G formula based on SCr of 1.41 mg/dL (H)).   Medical History: Past Medical History:  Diagnosis Date  . CHF (congestive heart failure) (HCC)   . Dementia (HCC)   . Hyperlipidemia   . Hypertension     Medications:  Scheduled:  . aspirin  81 mg Oral Daily  . atorvastatin  40 mg Oral q1800  . carvedilol  3.125 mg Oral BID WC  . chlorhexidine  15 mL Mouth Rinse BID  . Chlorhexidine Gluconate Cloth  6 each Topical Daily  . enoxaparin (LOVENOX) injection  70 mg Subcutaneous Q12H  . feeding supplement (ENSURE ENLIVE)  237 mL Oral TID BM  . insulin aspart  0-9 Units Subcutaneous Q4H  . mouth rinse  15 mL Mouth Rinse q12n4p  . multivitamin with minerals  1 tablet Oral Daily  . pantoprazole (PROTONIX) IV  40 mg Intravenous Daily  . sacubitril-valsartan  1 tablet Oral BID  . tamsulosin  0.4 mg Oral QPC supper   Infusions:  . sodium chloride Stopped (05/14/18 0805)  . sodium chloride Stopped (05/12/18 1014)  . dextrose Stopped (05/14/18 9735)    Assessment: 85 yo M on Eliquis 5 mg BID PTA for afib. Patient switched to Lovenox after being intubated post PEA arrest. Last dose of Eliquis given 4/25 @ 1529.  Patient is now extubated, but not taking po meds reliably.  Remains on Lovenox  1 mg/kg q 12 hrs.  CBC stable, no bleeding noted.  Dose appropriate for weight and renal function.  Goal of Therapy:  Monitor platelets by anticoagulation protocol: Yes   Plan:  Lovenox 1 mg/kg q12h (12 hours post last Eliquis dose) F/u transition back to Eliquis (can be given via NG tube if one is placed)  Tad Moore, BCPS, [facility] Clinical Pharmacist Phone [phone]  05/14/2018 10:08 AM

## 2018-05-14 NOTE — Progress Notes (Signed)
Pt is confused and trying to get out of bed. Paged [facility] and  placed orders for a Recruitment consultant.

## 2018-05-14 NOTE — Progress Notes (Signed)
Pt has not voided since a urine occurrence around 1530. Pt bed pad is currently wet. Pt was bladder scanned and showed 288 ml. Will continue to monitor.

## 2018-05-14 NOTE — Discharge Summary (Signed)
Discharge Summary  Tyler Carr DGL:[id number] DOB: 06-Oct-1930  PCP: Patient, No Pcp Per  Admit date: 05/09/2018 Discharge date: 05/14/2018  Time spent: , more than 50% time spent on coordination of care.  Recommendations for Outpatient Follow-up:  1. [facility], repeat cbc/bmp at follow up 2. F/u with cardiology , he is recently  Started on entresto, he needs to have repeat echocardiogram in 4 weeks, he is to recheck bmp on Friday, cardiology is arranging .  3. Home health arranged 4. Discharge meds to be delivered to his room prior to discharge  Discharge Diagnoses:  Active Hospital Problems   Diagnosis Date Noted   Drug overdose, accidental or unintentional, initial encounter 05/09/2018   Cardiac arrest (HCC) 05/11/2018   Hypotension 05/09/2018   Symptomatic bradycardia 01/30/2018   Atrial fibrillation with slow ventricular response [facility])    Hyperlipidemia     Resolved Hospital Problems  No resolved problems to display.    Discharge Condition: stable  Diet recommendation: heart healthy  Filed Weights   05/12/18 0105 05/13/18 0427 05/14/18 0401  Weight: 69 kg 68.2 kg 70.2 kg    History of present illness: (per admitting MD Dr Mikeal Hawthorne) Outpatient Specialists:   Nada Libman, MD  Vascular Surgery    Patient coming from: Home  Chief Complaint: Inadvertent overdose  HPI: Tyler Carr is a 85 y.o. male with medical history significant of congestive heart failure, Renal artery stenosis, hypertension, chronic bradycardia, hyperlipidemia, mild memory impairment who was brought in by daughter secondary to drug overdose.  Patient has a pillbox that is being set up by his wife on a regular basis.  This morning his wife was away from the medications while setting it out on the table.  Patient mistakenly took a whole week's supply of his medication at once.  He is mainly on cardiovascular medications  including hydralazine, Eliquis, Coreg, Norvasc, Flomax, Lasix and Namenda with aspirin.  Patient is currently awake and alert communicating but weak.  His blood pressure was in the 80s on arrival.  Poison control has been involved.  At this point he is being admitted for supportive care.  He is noted to have profound bradycardia with rates in the 20s.  He has previously refused a pacemaker.  Patient and daughter however currently interested in pacemaker if recommended..  ED Course: Temperature 98 blood pressure 83/44 pulse 43 respiratory rate of 19 oxygen sat 93% room air.  Chemistry appears to be stable except for creatinine of 1.44 BUN 27.  Albumin 3.4.  Hemoglobin is 10.9 and CBC is otherwise normal.  EKG showed irregular rhythm.  The rate of 51.  Patient is admitted on 4/24 and transferred from a floor to icu on 05/11/2018 with cardiac arrest. PEA for 12 minutes and received 3 rounds of epi. Intubated on 05/11/2018.Tolerated extubation on 05/11/2018. 2D echocardiogram was performed (EF of 30%, prev Echo 01/31/2018: ef 50%). Early 05/12/2018 a.m. patient had oliguria and was given a one-time dose of 40 mg of Lasix IV. 1L diuresed s/p lasix. Patient was transferred to [facility] service 4/28 patient remains confused but follows commands, n.p.o. pending speech eval. Cardiology following for elevated troponin PEA arrest.  Hospital Course:  Principal Problem:   Drug overdose, accidental or unintentional, initial encounter Active Problems:   Hyperlipidemia   Symptomatic bradycardia   Atrial fibrillation with slow ventricular response (HCC)   Hypotension   Cardiac arrest (HCC)   PEA arrest in  the setting of hypoxia with ROSC after 12 minutes of CPR.  Elevated troponin could be either non-ST E MI or possibly stress-induced cardiomyopathy.   Noted cardiology recommendation and plan is to continue on medical treatment   started on coreg,  low-dose Entresto.  lasix changed to daily prn for  edema Follow-up limited echo in several weeks for EF evaluation  Acute hypoxic respiratory failure.  Resolved, patient was extubated in the ICU.  Currently on room air.  Acute encephalopathy in the setting of advanced dementia He is improving, pleasantly confused at discharge, oriented to self only Per daughter patient at baseline oriented to self, recognize wife and daughter, but does not recognize son in law  He is on Namenda at home. cont Supportive care   Elevated troponin peaked 28.6.see above.  Non-ST elevation MI versus stress-induced cardiomyopathy. Cardiology consulted, per cardiology " The patient is not a candidate for any invasive evaluation because of advanced dementia and comorbid medical conditions."  Acute on chronic combined CHF with current EF 30%, was 50% on January 2020.  Stress-induced cardiomyopathy versus an ST elevation MI given elevated troponin.   Plan is beta-blocker low-dose Entresto. Prn lasix Follow-up limited echo in several weeks for EF evaluation  Permanent A. fib with tachybradycardia syndrome: Restart Coreg  And home Eliquis once able to take p.o.  Essential hypertension :  He is discharged on coreg, entresto, prn lasix   Drug overdose, accidental/ unintentional, initial encounter.  Admitted 4/25 after being noted of unintentional taking of all his medication in 1 sitting including hydralazine, Eliquis, Coreg, Norvasc, Flomax, Lasix and Namenda, was given orbitol and activated charcoal in the ER.  Patient will need supervision at home with medication. Family reports has a plan in place  AKI creatinine improved.  he is newly started on entresto, will need repeat bmp on Friday, cardiology is arranging   Hypoglycemia, from n.p.o.   Blood glucose improved after starts to eat  Urinary retention,  required in and out cathx1 on 4/28-4/29 night, he voided prior to discharge Continue flomax, follow with urology as needed  FTT; he passed swallow  eval, Pt/OT recommends home health which is arranged    DVT prophylaxis: sq lovenox BID while npo in the hospital Code Status: full Family Communication: daughter over the phone  Disposition Plan: home with home health   Consultants:  PCCM CARDIOLOGY    Procedures:  Intubation /extubation  cpr on 4/26 for PEA arrest    Discharge Exam: BP (!) 136/94 (BP Location: Left Arm)    Pulse 100    Temp 98.8 F (37.1 C) (Oral)    Resp 18    Ht 5\' 11"  (1.803 m)    Wt 70.2 kg    SpO2 98%    BMI 21.59 kg/m   General: alert, pleasantly confused, oriented to self only Cardiovascular: IRRR Respiratory: diminished at basis, no wheezing, no rhonchi, no rales   Discharge Instructions You were cared for by a hospitalist during your hospital stay. If you have any questions about your discharge medications or the care you received while you were in the hospital after you are discharged, you can call the unit and asked to speak with the hospitalist on call if the hospitalist that took care of you is not available. Once you are discharged, your primary care physician will handle any further medical issues. Please note that NO REFILLS for any discharge medications will be authorized once you are discharged, as it is imperative that you return  to your primary care physician (or establish a relationship with a primary care physician if you do not have one) for your aftercare needs so that they can reassess your need for medications and monitor your lab values.  Discharge Instructions    Diet - low sodium heart healthy   Complete by:  As directed    Increase activity slowly   Complete by:  As directed      Allergies as of 05/14/2018   No Known Allergies     Medication List    STOP taking these medications   amLODipine 5 MG tablet Commonly known as:  NORVASC   aspirin 81 MG EC tablet   hydrALAZINE 50 MG tablet Commonly known as:  APRESOLINE   lisinopril 40 MG tablet Commonly known as:   ZESTRIL   simvastatin 40 MG tablet Commonly known as:  ZOCOR     TAKE these medications   atorvastatin 40 MG tablet Commonly known as:  LIPITOR Take 1 tablet (40 mg total) by mouth daily at 6 PM.   brimonidine 0.15 % ophthalmic solution Commonly known as:  ALPHAGAN Place 1 drop into both eyes 2 (two) times a day.   carvedilol 3.125 MG tablet Commonly known as:  COREG Take 1 tablet (3.125 mg total) by mouth 2 (two) times daily with a meal. What changed:    medication strength  how much to take  when to take this   dorzolamide-timolol 22.3-6.8 MG/ML ophthalmic solution Commonly known as:  COSOPT Place 1 drop into both eyes 2 (two) times daily.   Eliquis 5 MG Tabs tablet Generic drug:  apixaban Take 5 mg by mouth 2 (two) times daily.   feeding supplement (GLUCERNA SHAKE) Liqd Take 237 mLs by mouth 2 (two) times daily between meals.   furosemide 40 MG tablet Commonly known as:  LASIX Take 1 tablet (40 mg total) by mouth daily as needed for fluid or edema. What changed:    when to take this  reasons to take this   Melatonin 3 MG Tabs Take 3-6 mg by mouth at bedtime as needed (sleep and agitation).   memantine 10 MG tablet Commonly known as:  NAMENDA Take 5 mg by mouth See admin instructions. Titration: Take 5 mg by mouth at bedtime for 2 weeks, then 10 mg at bedtime for 2 weeks, then 15 mg at bedtime for 2 weeks, then 20 mg at bedtime (thereafter)   multivitamin with minerals Tabs tablet Take 1 tablet by mouth daily. Start taking on:  May 15, 2018   PRESCRIPTION MEDICATION 1 application See admin instructions. [employer] Medical Compression Machine- Apply as directed to bilateral legs/waist area every morning for a period of 1 hour   sacubitril-valsartan 24-26 MG Commonly known as:  ENTRESTO Take 1 tablet by mouth 2 (two) times daily. Please contact cardiology office for medication refills or prior authorization request.   tamsulosin 0.4 MG Caps  capsule Commonly known as:  FLOMAX Take 1 capsule (0.4 mg total) by mouth daily.      No Known Allergies Follow-up Information    [facility] Follow up.   Why:  They will do your home health care at your home Contact information: [address] [place], [place] [address] tele # [phone]       Nada Libman, MD Follow up in 4 month(s).   Specialties:  Vascular Surgery, Cardiology Why:  renal artery stenosis s/p stent Contact information: [address] [place] [address] [phone]  [facility] in 4weeks Follow up.        Tonny Bollman, MD Follow up.   Specialty:  Cardiology Why:  please call [facility] cardiology if you choose not to follow up with [facility] cardiology. repeat echocardiogram in 4weeks Contact information: [address] [place] [address] [phone]        [facility] Follow up in 1 month(s).   Why:  as needed for urinary difficulties  Contact information: [address] [place] [address] [phone]       f/u with pcp at [facility] Follow up in 1 week(s).   Why:  for hospital discharge follow up repeat cbc/bmp at follow up       [facility] Follow up on 05/16/2018.   Specialty:  Cardiology Why:  Please arrive at Cardiology office on [address] anytime on Friday 5/1 between 7:30am to 4pm for a lab visit to recheck renal function. Contact information: [address] [place] [address] [phone]           The results of significant diagnostics from this hospitalization (including imaging, microbiology, ancillary and laboratory) are listed below for reference.    Significant Diagnostic Studies: Dg Abd 1 View  Result Date: 05/11/2018 CLINICAL DATA:  Encounter for orogastric tube placement. EXAM: ABDOMEN - 1 VIEW COMPARISON:  None. FINDINGS: Gas pattern is nonspecific.  Orogastric tube tip lies in the LEFT upper quadrant, likely along the greater curvature. Massive cardiomegaly. IMPRESSION: Orogastric tube tip lies in the LEFT upper quadrant, likely along the greater curvature. Electronically Signed   By: Elsie Stain M.D.   On: 05/11/2018 07:01   Dg Chest Port 1 View  Result Date: 05/11/2018 CLINICAL DATA:  Endotracheal intubation EXAM: PORTABLE CHEST 1 VIEW COMPARISON:  05/10/2018 FINDINGS: Endotracheal tube tip is just below the clavicular heads, well above the carina. There is unchanged moderate cardiomegaly with central pulmonary vascular congestion. Small right pleural effusion. Left lung base is incompletely visualized. IMPRESSION: Radiographically appropriate position of endotracheal tube. Electronically Signed   By: Deatra Robinson M.D.   On: 05/11/2018 01:02   Dg Chest Port 1 View  Result Date: 05/10/2018 CLINICAL DATA:  Acute respiratory distress EXAM: PORTABLE CHEST 1 VIEW COMPARISON:  05/10/2018 FINDINGS: Cardiomegaly with vascular congestion. Improving bibasilar opacities since prior study. Suspect small effusions. Left infrahilar opacity could reflect residual asymmetric edema or infection. IMPRESSION: Cardiomegaly, vascular congestion. Left infrahilar airspace disease could reflect asymmetric edema or infection. Small effusions. Electronically Signed   By: Charlett Nose M.D.   On: 05/10/2018 23:02   Dg Chest Port 1 View  Result Date: 05/10/2018 CLINICAL DATA:  Shortness of breath EXAM: PORTABLE CHEST 1 VIEW COMPARISON:  None. FINDINGS: Non inclusion of the lung bases. Cardiomegaly with vascular congestion. Diffuse interstitial opacity suspicious for edema. Probable pleural effusions. Basilar airspace disease. IMPRESSION: Incompletely included lung bases. Cardiomegaly with vascular congestion and diffuse interstitial edema. Suspect that there are small pleural effusions and basilar atelectasis or infiltrate. Electronically Signed   By: Jasmine Pang M.D.    On: 05/10/2018 18:48    Microbiology: Recent Results (from the past 240 hour(s))  MRSA PCR Screening     Status: None   Collection Time: 05/11/18 12:57 AM  Result Value Ref Range Status   MRSA by PCR NEGATIVE NEGATIVE Final    Comment:        The GeneXpert MRSA Assay (FDA  approved for NASAL specimens only), is one component of a comprehensive MRSA colonization surveillance program. It is not intended to diagnose MRSA infection nor to guide or monitor treatment for MRSA infections. Performed at [facility], [address], [place], [place] [address]      Labs: Basic Metabolic Panel: Recent Labs  Lab 05/11/18 0023 05/11/18 0217 05/11/18 0531 05/11/18 1915 05/12/18 0233 05/13/18 0430 05/14/18 0835  NA 144 146* 143  --  146* 144 146*  K 3.1* 3.8 4.2 3.7 4.0 2.8* 3.6  CL 111 110  --   --  112* 112* 110  CO2 16* 17*  --   --  21* 20* 23  GLUCOSE 236* 149*  --   --  78 109* 144*  BUN 26* 28*  --   --  35* 32* 29*  CREATININE 1.35* 1.22  --   --  1.31* 1.25* 1.41*  CALCIUM 8.8* 9.3  --   --  9.0 8.1* 9.3  MG  --  2.2  --  2.0 2.1 1.8 2.1  PHOS  --  5.4*  --   --  2.7  --   --    Liver Function Tests: Recent Labs  Lab 05/11/18 0023 05/11/18 0217 05/12/18 0233 05/13/18 0430 05/14/18 0835  AST 110* 119* 125* 83* 78*  ALT 48* 54* 47* 40 44  ALKPHOS 63 54 53 52 57  BILITOT 0.8 0.8 1.2 1.2 1.1  PROT 6.6 6.6 5.7* 5.2* 6.2*  ALBUMIN 3.6 3.6 3.0* 2.6* 3.2*   No results for input(s): LIPASE, AMYLASE in the last 168 hours. Recent Labs  Lab 05/11/18 0217  AMMONIA 17   CBC: Recent Labs  Lab 05/09/18 1330 05/10/18 0447 05/11/18 0023 05/11/18 0217 05/11/18 0531 05/12/18 0233 05/14/18 0835  WBC 4.5 6.5 11.6* 9.1  --  9.5 8.8  NEUTROABS 2.7  --   --  8.0*  --   --  6.7  HGB 10.9* 11.4* 11.9* 11.9* 11.2* 11.8* 12.8*  HCT 33.0* 34.9* 37.0* 36.9* 33.0* 35.9* 39.2  MCV 94.0 92.1 94.6 94.1  --  93.7 92.0  PLT 156 180 202 181  --  149* 186   Cardiac  Enzymes: Recent Labs  Lab 05/11/18 0217 05/11/18 0708 05/11/18 1318 05/12/18 0702  TROPONINI 10.78* 23.58* 28.68* 19.16*   BNP: BNP (last 3 results) No results for input(s): BNP in the last 8760 hours.  ProBNP (last 3 results) No results for input(s): PROBNP in the last 8760 hours.  CBG: Recent Labs  Lab 05/13/18 1932 05/13/18 2313 05/14/18 0400 05/14/18 0744 05/14/18 1117  GLUCAP 105* 91 101* 118* 125*       Signed:  Albertine Grates MD, PhD  [facility] 05/14/2018, 3:40 PM

## 2018-05-16 ENCOUNTER — Other Ambulatory Visit: Payer: Medicare Other

## 2018-05-19 ENCOUNTER — Other Ambulatory Visit: Payer: Self-pay

## 2018-05-19 ENCOUNTER — Other Ambulatory Visit: Payer: Medicare Other | Admitting: *Deleted

## 2018-05-19 DIAGNOSIS — I5020 Unspecified systolic (congestive) heart failure: Secondary | ICD-10-CM

## 2018-05-19 DIAGNOSIS — N179 Acute kidney failure, unspecified: Secondary | ICD-10-CM

## 2018-05-20 LAB — BASIC METABOLIC PANEL
BUN/Creatinine Ratio: 15 (ref 10–24)
BUN: 19 mg/dL (ref 8–27)
CO2: 23 mmol/L (ref 20–29)
Calcium: 9.1 mg/dL (ref 8.6–10.2)
Chloride: 108 mmol/L — ABNORMAL HIGH (ref 96–106)
Creatinine, Ser: 1.26 mg/dL (ref 0.76–1.27)
GFR calc Af Amer: 60 mL/min/{1.73_m2} (ref >59)
GFR calc non Af Amer: 52 mL/min/{1.73_m2} — ABNORMAL LOW (ref >59)
Glucose: 84 mg/dL (ref 65–99)
Potassium: 4.4 mmol/L (ref 3.5–5.2)
Sodium: 146 mmol/L — ABNORMAL HIGH (ref 134–144)

## 2018-05-22 ENCOUNTER — Telehealth: Payer: Self-pay | Admitting: Physician Assistant

## 2018-05-22 ENCOUNTER — Telehealth: Payer: Self-pay | Admitting: Cardiology

## 2018-05-22 NOTE — Telephone Encounter (Signed)
error 

## 2018-05-22 NOTE — Telephone Encounter (Signed)
Spoke with patient's spouse, [name], who confirmed all demographics. Patient has a smart phone and has link to My Chart. Will have vitals ready for visit.    Virtual Visit Pre-Appointment Phone Call  "(Name), I am calling you today to discuss your upcoming appointment. We are currently trying to limit exposure to the virus that causes COVID-19 by seeing patients at home rather than in the office."  1. "What is the BEST phone number to call the day of the visit?" - include this in appointment notes  2. Do you have or have access to (through a family member/friend) a smartphone with video capability that we can use for your visit?" a. If yes - list this number in appt notes as cell (if different from BEST phone #) and list the appointment type as a VIDEO visit in appointment notes b. If no - list the appointment type as a PHONE visit in appointment notes  Confirm consent - "In the setting of the current Covid19 crisis, you are scheduled for a (phone or video) visit with your provider on (date) at (time).  Just as we do with many in-office visits, in order for you to participate in this visit, we must obtain consent.  If you'd like, I can send this to your mychart (if signed up) or email for you to review.  Otherwise, I can obtain your verbal consent now.  All virtual visits are billed to your insurance company just like a normal visit would be.  By agreeing to a virtual visit, we'd like you to understand that the technology does not allow for your provider to perform an examination, and thus may limit your provider's ability to fully assess your condition. If your provider identifies any concerns that need to be evaluated in person, we will make arrangements to do so.  Finally, though the technology is pretty good, we cannot assure that it will always work on either your or our end, and in the setting of a video visit, we may have to convert it to a phone-only visit.  In either situation, we cannot  ensure that we have a secure connection.  Are you willing to proceed?"  Patient's spouse said "yes".  3. Advise patient to be prepared - "Two hours prior to your appointment, go ahead and check your blood pressure, pulse, oxygen saturation, and your weight (if you have the equipment to check those) and write them all down. When your visit starts, your provider will ask you for this information. If you have an Apple Watch or Kardia device, please plan to have heart rate information ready on the day of your appointment. Please have a pen and paper handy nearby the day of the visit as well."  4. Give patient instructions for MyChart download to smartphone OR [email or web address] as below if video visit (depending on what platform provider is using)  5. Inform patient they will receive a phone call 15 minutes prior to their appointment time (may be from unknown caller ID) so they should be prepared to answer    TELEPHONE CALL NOTE  Tyler Carr has been deemed a candidate for a follow-up tele-health visit to limit community exposure during the Covid-19 pandemic. I spoke with the patient via phone to ensure availability of phone/video source, confirm preferred email & phone number, and discuss instructions and expectations.  I reminded Tyler Carr to be prepared with any vital sign and/or heart rhythm information that could potentially be obtained via home monitoring, at the time  of his visit. I reminded Tyler Carr to expect a phone call prior to his visit.  Royann Shivers 05/22/2018 1:50 PM   INSTRUCTIONS FOR DOWNLOADING THE MYCHART APP TO SMARTPHONE  - The patient must first make sure to have activated MyChart and know their login information - If Apple, go to [employer] and type in MyChart in the search bar and download the app. If Android, ask patient to go to [employer] and type in [place] in the search bar and download the app. The app is free but as with any other app downloads, their  phone may require them to verify saved payment information or Apple/Android password.  - The patient will need to then log into the app with their MyChart username and password, and select [facility] as their healthcare provider to link the account. When it is time for your visit, go to the MyChart app, find appointments, and click Begin Video Visit. Be sure to Select Allow for your device to access the Microphone and Camera for your visit. You will then be connected, and your provider will be with you shortly.  **If they have any issues connecting, or need assistance please contact MyChart service desk [phone])**  **If using a computer, in order to ensure the best quality for their visit they will need to use either of the following Internet Browsers: [employer] or Google Chrome**  IF USING DOXIMITY or DOXY.ME - The patient will receive a link just prior to their visit by text.     FULL LENGTH CONSENT FOR TELE-HEALTH VISIT   I hereby voluntarily request, consent and authorize [facility] and its employed or contracted physicians, physician assistants, nurse practitioners or other licensed health care professionals (the Practitioner), to provide me with telemedicine health care services (the Services") as deemed necessary by the treating Practitioner. I acknowledge and consent to receive the Services by the Practitioner via telemedicine. I understand that the telemedicine visit will involve communicating with the Practitioner through live audiovisual communication technology and the disclosure of certain medical information by electronic transmission. I acknowledge that I have been given the opportunity to request an in-person assessment or other available alternative prior to the telemedicine visit and am voluntarily participating in the telemedicine visit.  I understand that I have the right to withhold or withdraw my consent to the use of telemedicine in the course of  my care at any time, without affecting my right to future care or treatment, and that the Practitioner or I may terminate the telemedicine visit at any time. I understand that I have the right to inspect all information obtained and/or recorded in the course of the telemedicine visit and may receive copies of available information for a reasonable fee.  I understand that some of the potential risks of receiving the Services via telemedicine include:   Delay or interruption in medical evaluation due to technological equipment failure or disruption;  Information transmitted may not be sufficient (e.g. poor resolution of images) to allow for appropriate medical decision making by the Practitioner; and/or   In rare instances, security protocols could fail, causing a breach of personal health information.  Furthermore, I acknowledge that it is my responsibility to provide information about my medical history, conditions and care that is complete and accurate to the best of my ability. I acknowledge that Practitioner's advice, recommendations, and/or decision may be based on factors not within their control, such as incomplete or inaccurate data provided by me  or distortions of diagnostic images or specimens that may result from electronic transmissions. I understand that the practice of medicine is not an exact science and that Practitioner makes no warranties or guarantees regarding treatment outcomes. I acknowledge that I will receive a copy of this consent concurrently upon execution via email to the email address I last provided but may also request a printed copy by calling the office of [facility].    I understand that my insurance will be billed for this visit.   I have read or had this consent read to me.  I understand the contents of this consent, which adequately explains the benefits and risks of the Services being provided via telemedicine.   I have been provided ample opportunity to ask  questions regarding this consent and the Services and have had my questions answered to my satisfaction.  I give my informed consent for the services to be provided through the use of telemedicine in my medical care  By participating in this telemedicine visit I agree to the above.

## 2018-05-23 ENCOUNTER — Other Ambulatory Visit: Payer: Self-pay

## 2018-05-23 ENCOUNTER — Emergency Department (HOSPITAL_COMMUNITY): Payer: Medicare Other

## 2018-05-23 ENCOUNTER — Emergency Department (HOSPITAL_COMMUNITY)
Admission: EM | Admit: 2018-05-23 | Discharge: 2018-05-23 | Disposition: A | Discharge status: Home / Self Care | Payer: Medicare Other | Attending: Emergency Medicine | Admitting: Emergency Medicine

## 2018-05-23 ENCOUNTER — Encounter (HOSPITAL_COMMUNITY): Payer: Self-pay

## 2018-05-23 DIAGNOSIS — I110 Hypertensive heart disease with heart failure: Secondary | ICD-10-CM | POA: Insufficient documentation

## 2018-05-23 DIAGNOSIS — I5042 Chronic combined systolic (congestive) and diastolic (congestive) heart failure: Secondary | ICD-10-CM | POA: Diagnosis not present

## 2018-05-23 DIAGNOSIS — Z79899 Other long term (current) drug therapy: Secondary | ICD-10-CM | POA: Diagnosis not present

## 2018-05-23 DIAGNOSIS — F0390 Unspecified dementia without behavioral disturbance: Secondary | ICD-10-CM | POA: Insufficient documentation

## 2018-05-23 DIAGNOSIS — Z7901 Long term (current) use of anticoagulants: Secondary | ICD-10-CM | POA: Insufficient documentation

## 2018-05-23 DIAGNOSIS — E8770 Fluid overload, unspecified: Secondary | ICD-10-CM | POA: Insufficient documentation

## 2018-05-23 LAB — BASIC METABOLIC PANEL
Anion gap: 11 (ref 5–15)
BUN: 17 mg/dL (ref 8–23)
CO2: 23 mmol/L (ref 22–32)
Calcium: 8.9 mg/dL (ref 8.9–10.3)
Chloride: 110 mmol/L (ref 98–111)
Creatinine, Ser: 1.28 mg/dL — ABNORMAL HIGH (ref 0.61–1.24)
GFR calc Af Amer: 59 mL/min — ABNORMAL LOW (ref >60)
GFR calc non Af Amer: 51 mL/min — ABNORMAL LOW (ref >60)
Glucose, Bld: 101 mg/dL — ABNORMAL HIGH (ref 70–99)
Potassium: 3.9 mmol/L (ref 3.5–5.1)
Sodium: 144 mmol/L (ref 135–145)

## 2018-05-23 LAB — CBC WITH DIFFERENTIAL/PLATELET
Abs Immature Granulocytes: 0.03 10*3/uL (ref 0.00–0.07)
Basophils Absolute: 0 10*3/uL (ref 0.0–0.1)
Basophils Relative: 1 %
Eosinophils Absolute: 0.1 10*3/uL (ref 0.0–0.5)
Eosinophils Relative: 1 %
HCT: 37.9 % — ABNORMAL LOW (ref 39.0–52.0)
Hemoglobin: 12.1 g/dL — ABNORMAL LOW (ref 13.0–17.0)
Immature Granulocytes: 1 %
Lymphocytes Relative: 24 %
Lymphs Abs: 1.5 10*3/uL (ref 0.7–4.0)
MCH: 30.1 pg (ref 26.0–34.0)
MCHC: 31.9 g/dL (ref 30.0–36.0)
MCV: 94.3 fL (ref 80.0–100.0)
Monocytes Absolute: 0.6 10*3/uL (ref 0.1–1.0)
Monocytes Relative: 9 %
Neutro Abs: 4.2 10*3/uL (ref 1.7–7.7)
Neutrophils Relative %: 64 %
Platelets: 250 10*3/uL (ref 150–400)
RBC: 4.02 MIL/uL — ABNORMAL LOW (ref 4.22–5.81)
RDW: 15 % (ref 11.5–15.5)
WBC: 6.4 10*3/uL (ref 4.0–10.5)
nRBC: 0 % (ref 0.0–0.2)

## 2018-05-23 MED ORDER — FUROSEMIDE 10 MG/ML IJ SOLN
40.0000 mg | Freq: Once | INTRAMUSCULAR | Status: AC
  Administered 2018-05-23: 11:00:00 40 mg via INTRAVENOUS
  Filled 2018-05-23: qty 4

## 2018-05-23 NOTE — ED Provider Notes (Signed)
[facility] EMERGENCY DEPARTMENT Provider Note   CSN: [id number] Arrival date & time: 05/23/18  1009    History   Chief Complaint Chief Complaint  Patient presents with  . Illness    HPI Aadarsh Cozort is a 85 y.o. male.     Family states that patient has had some increased weight gain over the last several days.  May be 10 pounds.  Home health came out to the house today and thought that he should come for evaluation for concern for heart failure exacerbation.  Patient has no respiratory symptoms.  Has some swelling in his legs chronically.  Patient has not taken Lasix for the last 3 to 4 days as this medication is prescribed as needed.  Patient is comfortable.  No chest pain.  No abdominal pain.  The history is provided by the patient and a caregiver.  Illness  Location:  General Severity:  Mild Timing:  Intermittent Progression:  Unchanged Chronicity:  Recurrent Associated symptoms: no abdominal pain, no chest pain, no cough, no ear pain, no fever, no rash, no rhinorrhea, no shortness of breath, no sore throat and no vomiting     Past Medical History:  Diagnosis Date  . CHF (congestive heart failure) (HCC)   . Dementia (HCC)   . Hyperlipidemia   . Hypertension     Patient Active Problem List   Diagnosis Date Noted  . Acute on chronic combined systolic and diastolic CHF (congestive heart failure) (HCC)   . AKI (acute kidney injury) (HCC)   . Vascular dementia without behavioral disturbance (HCC)   . Cardiac arrest (HCC) 05/11/2018  . Drug overdose, accidental or unintentional, initial encounter 05/09/2018  . Hypotension 05/09/2018  . Accelerated hypertension   . Symptomatic bradycardia 01/30/2018  . CHF (congestive heart failure) (HCC)   . Hypertension   . Hyperlipidemia   . Atrial fibrillation with slow ventricular response [facility])     Past Surgical History:  Procedure Laterality Date  . ABDOMINAL AORTOGRAM N/A 02/04/2018   Procedure:  ABDOMINAL AORTOGRAM;  Surgeon: Nada Libman, MD;  Location: [facility] INVASIVE CV LAB;  Service: Cardiovascular;  Laterality: N/A;  . HERNIA REPAIR    . PERIPHERAL VASCULAR INTERVENTION Left 02/04/2018   Procedure: PERIPHERAL VASCULAR INTERVENTION;  Surgeon: Nada Libman, MD;  Location: [facility] INVASIVE CV LAB;  Service: Cardiovascular;  Laterality: Left;  Renal artery        Home Medications    Prior to Admission medications   Medication Sig Start Date End Date Taking? Authorizing Provider  apixaban (ELIQUIS) 5 MG TABS tablet Take 5 mg by mouth 2 (two) times daily.    [provider]  atorvastatin (LIPITOR) 40 MG tablet Take 1 tablet (40 mg total) by mouth daily at 6 PM. 05/14/18   Albertine Grates, MD  brimonidine (ALPHAGAN) 0.15 % ophthalmic solution Place 1 drop into both eyes 2 (two) times a day.    [provider]  carvedilol (COREG) 3.125 MG tablet Take 1 tablet (3.125 mg total) by mouth 2 (two) times daily with a meal. 05/14/18   Albertine Grates, MD  dorzolamide-timolol (COSOPT) 22.3-6.8 MG/ML ophthalmic solution Place 1 drop into both eyes 2 (two) times daily.    [provider]  feeding supplement, GLUCERNA SHAKE, (GLUCERNA SHAKE) LIQD Take 237 mLs by mouth 2 (two) times daily between meals. 05/14/18   Albertine Grates, MD  furosemide (LASIX) 40 MG tablet Take 1 tablet (40 mg total) by mouth daily as needed for fluid  or edema. 05/14/18   Albertine Grates, MD  Melatonin 3 MG TABS Take 3-6 mg by mouth at bedtime as needed (sleep and agitation).    [provider]  memantine (NAMENDA) 10 MG tablet Take 5 mg by mouth See admin instructions. Titration: Take 5 mg by mouth at bedtime for 2 weeks, then 10 mg at bedtime for 2 weeks, then 15 mg at bedtime for 2 weeks, then 20 mg at bedtime (thereafter) 04/21/18   [provider]  Multiple Vitamin (MULTIVITAMIN WITH MINERALS) TABS tablet Take 1 tablet by mouth daily. 05/15/18   Albertine Grates, MD  PRESCRIPTION MEDICATION 1 application See admin  instructions. [employer]- Apply as directed to bilateral legs/waist area every morning for a period of 1 hour    [provider]  sacubitril-valsartan (ENTRESTO) 24-26 MG Take 1 tablet by mouth 2 (two) times daily. Please contact cardiology office for medication refills or prior authorization request. 05/14/18   Albertine Grates, MD  tamsulosin (FLOMAX) 0.4 MG CAPS capsule Take 1 capsule (0.4 mg total) by mouth daily. 02/06/18   Edsel Petrin, DO    Family History Family History  Problem Relation Age of Onset  . Stroke Mother     Social History Social History   Tobacco Use  . Smoking status: Never Smoker  . Smokeless tobacco: Never Used  Substance Use Topics  . Alcohol use: Not Currently  . Drug use: Not Currently     Allergies   Patient has no known allergies.   Review of Systems Review of Systems  Constitutional: Negative for chills and fever.  HENT: Negative for ear pain, rhinorrhea and sore throat.   Eyes: Negative for pain and visual disturbance.  Respiratory: Negative for cough and shortness of breath.   Cardiovascular: Positive for leg swelling. Negative for chest pain and palpitations.  Gastrointestinal: Negative for abdominal pain and vomiting.  Genitourinary: Negative for dysuria and hematuria.  Musculoskeletal: Negative for arthralgias and back pain.  Skin: Negative for color change and rash.  Neurological: Negative for seizures and syncope.  All other systems reviewed and are negative.    Physical Exam Updated Vital Signs  ED Triage Vitals  Enc Vitals Group     BP --      Pulse --      Resp --      Temp 05/23/18 1021 97.6 F (36.4 C)     Temp Source 05/23/18 1021 Oral     SpO2 --      Weight 05/23/18 1025 154 lb 12.2 oz (70.2 kg)     Height 05/23/18 1025  (1.803 m)     Head Circumference --      Peak Flow --      Pain Score 05/23/18 1025 0     Pain Loc --      Pain Edu? --      Excl. in GC? --     Physical  Exam Vitals signs and nursing note reviewed.  Constitutional:      General: He is not in acute distress.    Appearance: He is well-developed. He is not ill-appearing.  HENT:     Head: Normocephalic and atraumatic.     Nose: Nose normal.     Mouth/Throat:     Mouth: Mucous membranes are moist.  Eyes:     Extraocular Movements: Extraocular movements intact.     Conjunctiva/sclera: Conjunctivae normal.     Pupils: Pupils are equal, round, and reactive to light.  Neck:     Musculoskeletal: Normal range of motion and neck supple.  Cardiovascular:     Rate and Rhythm: Normal rate and regular rhythm.     Pulses: Normal pulses.     Heart sounds: Normal heart sounds. No murmur.  Pulmonary:     Effort: Pulmonary effort is normal. No respiratory distress.     Breath sounds: Normal breath sounds.  Abdominal:     General: Abdomen is flat. There is no distension.     Palpations: Abdomen is soft.     Tenderness: There is no abdominal tenderness.  Musculoskeletal:     Right lower leg: Edema (1+) present.     Left lower leg: Edema (1+) present.  Skin:    General: Skin is warm and dry.     Capillary Refill: Capillary refill takes less than 2 seconds.  Neurological:     Mental Status: He is alert.      ED Treatments / Results  Labs (all labs ordered are listed, but only abnormal results are displayed) Labs Reviewed  CBC WITH DIFFERENTIAL/PLATELET - Abnormal; Notable for the following components:      Result Value   RBC 4.02 (*)    Hemoglobin 12.1 (*)    HCT 37.9 (*)    All other components within normal limits  BASIC METABOLIC PANEL - Abnormal; Notable for the following components:   Glucose, Bld 101 (*)    Creatinine, Ser 1.28 (*)    GFR calc non Af Amer 51 (*)    GFR calc Af Amer 59 (*)    All other components within normal limits    EKG EKG Interpretation  Date/Time:  Friday May 23 2018 10:21:25 EDT Ventricular Rate:  84 PR Interval:    QRS Duration: 88 QT Interval:   408 QTC Calculation: 483 R Axis:   -114 Text Interpretation:  Atrial fibrillation Inferior infarct, age indeterminate Probable anterolateral infarct, age indeterm Confirmed by Virgina NorfolkAdam, Octavious Zidek [phone]([id number]) on 05/23/2018 11:27:54 AM   Radiology Dg Chest 2 View  Result Date: 05/23/2018 CLINICAL DATA:  Right-sided chest swelling since earlier this morning EXAM: CHEST - 2 VIEW COMPARISON:  05/11/2018 FINDINGS: Indistinct opacities at the bases with small pleural effusions. Cardiomegaly without pulmonary edema. No pneumothorax. Artifact from EKG leads. IMPRESSION: 1. Lower lobe atelectasis or pneumonia with small pleural effusions. 2. Cardiomegaly. Electronically Signed   By: Marnee SpringJonathon  Watts M.D.   On: 05/23/2018 10:56    Procedures Procedures (including critical care time)  Medications Ordered in ED Medications  furosemide (LASIX) injection 40 mg (40 mg Intravenous Given 05/23/18 1119)     Initial Impression / Assessment and Plan / ED Course  I have reviewed the triage vital signs and the nursing notes.  Pertinent labs & imaging results that were available during my care of the patient were reviewed by me and considered in my medical decision making (see chart for details).     Harrietta GuardianMarvin Bussey is an 85 year old male with history of hypertension, high cholesterol, heart failure, dementia who presents to the ED with concern for possibly increased fluid.  Patient with normal vitals except for mild hypertension.  Patient overall is asymptomatic.  He denies any chest pain, shortness of breath, abdominal pain.  Daughter states that the patient was sent to the ED due to increased weight gain.  Concern for about a 5 to 10 pound weight gain over the past week.  Patient takes Lasix as needed but has not had any in the last 4 to  5 days.  Home health aide came to weigh the patient today and noticed that he had gained weight.  However, patient has no signs of respiratory distress.  No chest pain.  Family states that  patient has been at his baseline.  He has not complained of any chest pain or shortness of breath to them.  He has some peripheral edema on exam.  No JVD or major wallets on exam.  Chest x-ray shows some small pleural effusions but no obvious pneumonia or pneumothorax.  Patient overall appears very well.  No significant anemia, electrolyte abnormality.  Kidney function is at baseline.  EKG shows atrial fibrillation, rate controlled. Suspect that this is likely from volume overload.  Overall patient was given IV Lasix while in the ED.  Recommend that the family start giving daily Lasix for the next 5 days and follow-up with primary care doctor within the next week for re-evaluation.  Told to return to ED if any symptoms worsen.  This chart was dictated using voice recognition software.  Despite best efforts to proofread,  errors can occur which can change the documentation meaning.    Final Clinical Impressions(s) / ED Diagnoses   Final diagnoses:  Hypervolemia, unspecified hypervolemia type    ED Discharge Orders    None       Virgina Norfolk, DO 05/23/18 1147

## 2018-05-23 NOTE — ED Notes (Signed)
discussed discharge instructions with pt and pt's daughter Doreene Burke); both verbalized understanding

## 2018-05-23 NOTE — ED Triage Notes (Signed)
Pt from home, c/o swelling to R chest; denies pain; no swelling to chest at present; swelling to bilateral lower extremities; pt has no other complaints

## 2018-05-23 NOTE — Discharge Instructions (Signed)
Take Lasix for the next 5 days.  Follow-up with your primary care doctor within the next week.

## 2018-05-29 ENCOUNTER — Other Ambulatory Visit: Payer: Self-pay

## 2018-05-29 ENCOUNTER — Encounter: Payer: Self-pay | Admitting: Physician Assistant

## 2018-05-29 ENCOUNTER — Telehealth (INDEPENDENT_AMBULATORY_CARE_PROVIDER_SITE_OTHER): Payer: Medicare Other | Admitting: Physician Assistant

## 2018-05-29 VITALS — BP 189/96 | HR 84 | Ht 71.0 in | Wt 158.0 lb

## 2018-05-29 DIAGNOSIS — I4819 Other persistent atrial fibrillation: Secondary | ICD-10-CM

## 2018-05-29 DIAGNOSIS — I5022 Chronic systolic (congestive) heart failure: Secondary | ICD-10-CM

## 2018-05-29 DIAGNOSIS — I129 Hypertensive chronic kidney disease with stage 1 through stage 4 chronic kidney disease, or unspecified chronic kidney disease: Secondary | ICD-10-CM

## 2018-05-29 DIAGNOSIS — I10 Essential (primary) hypertension: Secondary | ICD-10-CM

## 2018-05-29 MED ORDER — APIXABAN 5 MG PO TABS: 5.0000 mg | ORAL_TABLET | Freq: Two times a day (BID) | ORAL | 3 refills | Status: DC

## 2018-05-29 MED ORDER — CARVEDILOL 3.125 MG PO TABS: 3.1250 mg | ORAL_TABLET | Freq: Two times a day (BID) | ORAL | 3 refills | Status: DC

## 2018-05-29 MED ORDER — ATORVASTATIN CALCIUM 40 MG PO TABS: 40.0000 mg | ORAL_TABLET | Freq: Every day | ORAL | 3 refills | Status: DC

## 2018-05-29 NOTE — Progress Notes (Signed)
Virtual Visit via Video Note   This visit type was conducted due to national recommendations for restrictions regarding the COVID-19 Pandemic (e.g. social distancing) in an effort to limit this patient's exposure and mitigate transmission in our community.  Due to his co-morbid illnesses, this patient is at least at moderate risk for complications without adequate follow up.  This format is felt to be most appropriate for this patient at this time.  All issues noted in this document were discussed and addressed.  A limited physical exam was performed with this format.  Please refer to the patient's chart for his consent to telehealth for [facility].   Date:  05/29/2018   ID:  Harrietta Guardian, DOB [birth year], MRN [id number]  Patient Location: Home Provider Location: Home  PCP:  Patient, No Pcp Per  Cardiologist:  Donato Schultz, MD  Electrophysiologist:  None   Evaluation Performed:  Follow-Up Visit  Chief Complaint:  Hospital follow up  History of Present Illness:    Tyler Carr is a 85 y.o. male with a history of diastolic CHF with EF of 50% in 01/2018, permanent atrial fibrillation with tachy-brady syndrome, hypertension s/p left renal artery stenting, hyperlipidemia, CKD stage III, anddementia, who was admitted on 05/09/2018 after an accidental overdose multiple medications. Hospital course complicated by PEA arrest. Patient achieved ROSC after 12 minutes with 3 rounds of Epinephrine. Troponin peaked at 28.68. EKG showed permanent atrial fibrillation with chronic anterior Q wave. No prior ischemic evaluation in Epic and patient has no known heart disease. He followed at the [facility] care. Echo showed EF of 30%, down from 50% in 01/2018 (secondary to either NSTEMI or possibly stress-induced cardiomyopathy). Given age, dementia, and comorbidities, he is note a candidate for ischemic evaluation. Plan is to treat medically.  Patient is doing well since discharge.  He is compliant with  medications and low-sodium diet.  He wants to follow-up with [place].  His weight has been stable since recent ER visit.  Blood pressure elevated today however unsure about correct readings.  Says blood pressure usually runs in 120-140s over 80s.  He denies chest pain, shortness of breath, palpitation, orthopnea, PND, syncope, lower extremity edema or melena.  The patient does not have symptoms concerning for COVID-19 infection (fever, chills, cough, or new shortness of breath).    Past Medical History:  Diagnosis Date  . CHF (congestive heart failure) (HCC)   . Dementia (HCC)   . Hyperlipidemia   . Hypertension    Past Surgical History:  Procedure Laterality Date  . ABDOMINAL AORTOGRAM N/A 02/04/2018   Procedure: ABDOMINAL AORTOGRAM;  Surgeon: Nada Libman, MD;  Location: [facility] INVASIVE CV LAB;  Service: Cardiovascular;  Laterality: N/A;  . HERNIA REPAIR    . PERIPHERAL VASCULAR INTERVENTION Left 02/04/2018   Procedure: PERIPHERAL VASCULAR INTERVENTION;  Surgeon: Nada Libman, MD;  Location: [facility] INVASIVE CV LAB;  Service: Cardiovascular;  Laterality: Left;  Renal artery     Current Meds  Medication Sig  . apixaban (ELIQUIS) 5 MG TABS tablet Take 1 tablet (5 mg total) by mouth 2 (two) times daily.  Marland Kitchen atorvastatin (LIPITOR) 40 MG tablet Take 1 tablet (40 mg total) by mouth daily at 6 PM.  . brimonidine (ALPHAGAN) 0.15 % ophthalmic solution Place 1 drop into both eyes 2 (two) times a day.  . carvedilol (COREG) 3.125 MG tablet Take 1 tablet (3.125 mg total) by mouth 2 (two) times daily with a meal.  . dorzolamide-timolol (COSOPT) 22.3-6.8 MG/ML  ophthalmic solution Place 1 drop into both eyes 2 (two) times daily.  . feeding supplement, GLUCERNA SHAKE, (GLUCERNA SHAKE) LIQD Take 237 mLs by mouth 2 (two) times daily between meals.  . furosemide (LASIX) 40 MG tablet Take 1 tablet (40 mg total) by mouth daily as needed for fluid or edema.  . Melatonin 3 MG TABS Take 3-6 mg by mouth at bedtime as  needed (sleep and agitation).  . memantine (NAMENDA) 10 MG tablet Take 5 mg by mouth See admin instructions. Titration: Take 5 mg by mouth at bedtime for 2 weeks, then 10 mg at bedtime for 2 weeks, then 15 mg at bedtime for 2 weeks, then 20 mg at bedtime (thereafter)  . Multiple Vitamin (MULTIVITAMIN WITH MINERALS) TABS tablet Take 1 tablet by mouth daily.  Marland Kitchen PRESCRIPTION MEDICATION 1 application See admin instructions. [employer]- Apply as directed to bilateral legs/waist area every morning for a period of 1 hour  . sacubitril-valsartan (ENTRESTO) 24-26 MG Take 1 tablet by mouth 2 (two) times daily. Please contact cardiology office for medication refills or prior authorization request.  . tamsulosin (FLOMAX) 0.4 MG CAPS capsule Take 1 capsule (0.4 mg total) by mouth daily.  . [DISCONTINUED] apixaban (ELIQUIS) 5 MG TABS tablet Take 5 mg by mouth 2 (two) times daily.  . [DISCONTINUED] atorvastatin (LIPITOR) 40 MG tablet Take 1 tablet (40 mg total) by mouth daily at 6 PM.  . [DISCONTINUED] carvedilol (COREG) 3.125 MG tablet Take 1 tablet (3.125 mg total) by mouth 2 (two) times daily with a meal.     Allergies:   Patient has no known allergies.   Social History   Tobacco Use  . Smoking status: Never Smoker  . Smokeless tobacco: Never Used  Substance Use Topics  . Alcohol use: Not Currently  . Drug use: Not Currently     Family Hx: The patient's family history includes Stroke in his mother.  ROS:   Please see the history of present illness.    All other systems reviewed and are negative.   Prior CV studies:   The following studies were reviewed today:  As summarized above  Labs/Other Tests and Data Reviewed:    EKG:  No ECG reviewed.  Recent Labs: 01/31/2018: TSH 7.371 05/14/2018: ALT 44; Magnesium 2.1 05/23/2018: BUN 17; Creatinine, Ser 1.28; Hemoglobin 12.1; Platelets 250; Potassium 3.9; Sodium 144   Recent Lipid Panel Lab Results  Component Value  Date/Time   TRIG 37 05/11/2018 02:17 AM    Wt Readings from Last 3 Encounters:  05/29/18 158 lb (71.7 kg)  05/23/18 154 lb 12.2 oz (70.2 kg)  05/14/18 154 lb 12.2 oz (70.2 kg)     Objective:    Vital Signs:  BP (!) 189/96 Comment: bp may be malfunctioning.  Pulse 84   Ht 5\' 11"  (1.803 m)   Wt 158 lb (71.7 kg)   BMI 22.04 kg/m    VITAL SIGNS:  reviewed GEN:  no acute distress EYES:  sclerae anicteric, EOMI - Extraocular Movements Intact RESPIRATORY:  normal respiratory effort, symmetric expansion CARDIOVASCULAR:  no peripheral edema SKIN:  no rash, lesions or ulcers. MUSCULOSKELETAL:  no obvious deformities. NEURO:  alert and oriented x 3, no obvious focal deficit PSYCH:  normal affect  ASSESSMENT & PLAN:    1. Chronic systolic CHF - Echo during recent admission showed reduced LVEF of 30% from 50% in 01/2018. Given age, dementia, and comorbidities, he is note a candidate for ischemic evaluation.   Continue  Entresto and BB.  2. Permanent Atrial Fibrillation with Tachy-Brady Syndrome - Per note, refused PPM in past.  No syncope or dizziness.  Continue Eliquis and beta-blocker.  3. HTN -Elevated this morning however unsure about breathing.  Advised to keep a log 3 times per day for a week and give us a call with readings.  4. CKD stage III - Renal function stable on recent ER check   COVID-19 Education: The signs and symptoms of COVID-19 were discussed with the patient and how to seek care for testing (follow up with PCP or arrange E-visit).  The importance of social distancing was discussed today.  Time:   Today, I have spent 8 minutes with the patient with telehealth technology discussing the above problems.     Medication Adjustments/Labs and Tests Ordered: Current medicines are reviewed at length with the patient today.  Concerns regarding medicines are outlined above.   Tests Ordered: No orders of the defined types were placed in this encounter.   Medication  Changes: Meds ordered this encounter  Medications  . atorvastatin (LIPITOR) 40 MG tablet    Sig: Take 1 tablet (40 mg total) by mouth daily at 6 PM.    Dispense:  90 tablet    Refill:  3  . carvedilol (COREG) 3.125 MG tablet    Sig: Take 1 tablet (3.125 mg total) by mouth 2 (two) times daily with a meal.    Dispense:  180 tablet    Refill:  3  . apixaban (ELIQUIS) 5 MG TABS tablet    Sig: Take 1 tablet (5 mg total) by mouth 2 (two) times daily.    Dispense:  180 tablet    Refill:  3    Disposition:  Follow up in 4 month(s)  Signed, Manson PasseyBhavinkumar Albertine Lafoy, PA  05/29/2018 9:56 AM    [facility]

## 2018-05-29 NOTE — Progress Notes (Signed)
   Medication Instructions:   Your physician recommends that you continue on your current medications as directed. Please refer to the Current Medication list given to you today.  If you need a refill on your cardiac medications before your next appointment, please call your pharmacy.   Lab work: NONE ORDERED  TODAY   If you have labs (blood work) drawn today and your tests are completely normal, you will receive your results only by: Marland Kitchen MyChart Message (if you have MyChart) OR . A paper copy in the mail If you have any lab test that is abnormal or we need to change your treatment, we will call you to review the results.  Testing/Procedures: NONE ORDERED  TODAY   Follow-Up: At [facility], you and your health needs are our priority.  As part of our continuing mission to provide you with exceptional heart care, we have created designated Provider Care Teams.  These Care Teams include your primary Cardiologist (physician) and Advanced Practice Providers (APPs -  Physician Assistants and Nurse Practitioners) who all work together to provide you with the care you need, when you need it. You will need a follow up appointment in 4 months.  Please call our office 2 months in advance to schedule this appointment.  You may see Donato Schultz, MD or one of the following Advanced Practice Providers on your designated Care Team:   Norma Fredrickson, NP Nada Boozer, NP . Georgie Chard, NP  Any Other Special Instructions Will Be Listed Below (If Applicable).

## 2018-06-04 ENCOUNTER — Telehealth: Payer: Self-pay | Admitting: Cardiology

## 2018-06-04 NOTE — Telephone Encounter (Signed)
Pt had telemedicine visit with B. Bhagat on 05/29/18.  BP was elevated at that visit. Home health BPs as below. Will route to APP, Dr. Anne Fu and his primary nurse for review/folllow up.

## 2018-06-04 NOTE — Telephone Encounter (Signed)
New Message    Vernona Rieger from Home health Agency is calling because pts BP has been running a little high  164/92  168/88    Please call

## 2018-06-05 ENCOUNTER — Telehealth: Payer: Self-pay | Admitting: Cardiology

## 2018-06-05 MED ORDER — AMLODIPINE BESYLATE 2.5 MG PO TABS: 2.5000 mg | ORAL_TABLET | Freq: Every day | ORAL | 11 refills | Status: DC

## 2018-06-05 NOTE — Telephone Encounter (Signed)
LM TO CALL BACK ./CY 

## 2018-06-05 NOTE — Telephone Encounter (Signed)
Spoke with [place] and pt's B/P has been elevated for approx a week ranging 164-194/76-102 and pt seems agitated.Pt does still have 1 + edema no other complaints Will forward to Dr Anne Fu for review .Tyler Carr

## 2018-06-05 NOTE — Telephone Encounter (Signed)
[place] aware of recommendations and script for  Amlodipine 2.5 mg  was sent to [facility] via epic .Sofie Rower will notify pt./cy

## 2018-06-05 NOTE — Telephone Encounter (Signed)
Pt's wife aware to increase Furosemide to 40 mg bid and pt has appt tom with Nada Boozer NP at 1:15 pm .Zack Seal

## 2018-06-05 NOTE — Telephone Encounter (Signed)
Pt taking Lasix 40 mg every day ./cy

## 2018-06-05 NOTE — Telephone Encounter (Signed)
New Message    Tyler Carr is calling because the pts  BP has been high. This morning his BP was 194/102. She says a medication change or increase is needed    Please call back

## 2018-06-06 ENCOUNTER — Encounter: Payer: Self-pay | Admitting: *Deleted

## 2018-06-06 ENCOUNTER — Encounter: Payer: Self-pay | Admitting: Cardiology

## 2018-06-06 ENCOUNTER — Ambulatory Visit: Payer: Medicare Other | Admitting: Cardiology

## 2018-06-06 ENCOUNTER — Other Ambulatory Visit: Payer: Self-pay

## 2018-06-06 VITALS — BP 126/76 | HR 61 | Ht 71.0 in | Wt 157.0 lb

## 2018-06-06 DIAGNOSIS — N179 Acute kidney failure, unspecified: Secondary | ICD-10-CM | POA: Diagnosis not present

## 2018-06-06 DIAGNOSIS — I4819 Other persistent atrial fibrillation: Secondary | ICD-10-CM

## 2018-06-06 DIAGNOSIS — I5022 Chronic systolic (congestive) heart failure: Secondary | ICD-10-CM

## 2018-06-06 DIAGNOSIS — I10 Essential (primary) hypertension: Secondary | ICD-10-CM | POA: Diagnosis not present

## 2018-06-06 DIAGNOSIS — I420 Dilated cardiomyopathy: Secondary | ICD-10-CM

## 2018-06-06 NOTE — Patient Instructions (Addendum)
Medication Instructions:  .Your physician recommends that you continue on your current medications as directed. Please refer to the Current Medication list given to you today.  If you need a refill on your cardiac medications before your next appointment, please call your pharmacy.   Lab work: TODAY:  BMET  If you have labs (blood work) drawn today and your tests are completely normal, you will receive your results only by: Marland Kitchen MyChart Message (if you have MyChart) OR . A paper copy in the mail If you have any lab test that is abnormal or we need to change your treatment, we will call you to review the results.  Testing/Procedures: None ordered  Follow-Up: Your physician recommends that you schedule a follow-up appointment in: YOU HAVE BEEN SCHEDULED FOR A VIRTUAL VISIT, VIA PHONE CALL, WITH LAURA INGOLD, NP, 06/17/2018 AT 1:30.  Any Other Special Instructions Will Be Listed Below (If Applicable).  IF YOU GET DIZZY OR LIGHT HEADED, CHECK YOUR BLOOD PRESSURE AND CALL us TO LET us KNOW WHAT IT IS.

## 2018-06-06 NOTE — Progress Notes (Signed)
Cardiology Office Note   Date:  06/06/2018   ID:  Tyler Carr, DOB 1930-12-08, MRN [id number]  PCP:  [facility]  Cardiologist:  Dr. Anne Carr    Chief Complaint  Patient presents with  . Hypertension      History of Present Illness: Tyler Carr is a 85 y.o. male who presents for HTN with a history of diastolic CHF with EF of 50% in 01/2018, permanent atrial fibrillation with tachy-brady syndrome, hypertension s/p left renal artery stenting, hyperlipidemia, CKD stage III, and dementia, who was admitted on 05/09/2018 after an accidental overdose multiple medications. Hospital course complicated by PEA arrest. Patient achieved ROSC after 12 minutes with 3 rounds of Epinephrine. Troponin peaked at 28.68. EKG showed permanent atrial fibrillation with chronic anterior Q wave. No prior ischemic evaluation in Epic and patient has no known heart disease. He followed at the [facility] for Cardiology care. Echo showed EF of 30%, down from 50% in 01/2018 (secondary to either NSTEMI or possibly stress-induced cardiomyopathy).Given age, dementia, and comorbidities, he is not a candidate for ischemic evaluation. Plan is to treat medically.  On 05/29/18 he was stable.  Pt has called with BP to 194/102.  This was with home health.  Today with increase of Lasix to BID and amlodipine 2.5 mg daily he is improved.  He feels well. No chest pain and no SOB.     Past Medical History:  Diagnosis Date  . CHF (congestive heart failure) (HCC)   . Dementia (HCC)   . Hyperlipidemia   . Hypertension     Past Surgical History:  Procedure Laterality Date  . ABDOMINAL AORTOGRAM N/A 02/04/2018   Procedure: ABDOMINAL AORTOGRAM;  Surgeon: Tyler Libman, MD;  [facility];  Service: Cardiovascular;  Laterality: N/A;  . HERNIA REPAIR    . PERIPHERAL VASCULAR INTERVENTION Left 02/04/2018   Procedure: PERIPHERAL VASCULAR INTERVENTION;  Surgeon: Tyler Libman, MD;  [facility]: [facility];   Service: Cardiovascular;  Laterality: Left;  Renal artery     Current Outpatient Medications  Medication Sig Dispense Refill  . amLODipine (NORVASC) 5 MG tablet Take 2.5 mg by mouth daily.    Marland Kitchen apixaban (ELIQUIS) 5 MG TABS tablet Take 1 tablet (5 mg total) by mouth 2 (two) times daily. 180 tablet 3  . atorvastatin (LIPITOR) 40 MG tablet Take 1 tablet (40 mg total) by mouth daily at 6 PM. 90 tablet 3  . brimonidine (ALPHAGAN) 0.15 % ophthalmic solution Place 1 drop into both eyes 2 (two) times a day.    . carvedilol (COREG) 3.125 MG tablet Take 1 tablet (3.125 mg total) by mouth 2 (two) times daily with a meal. 180 tablet 3  . dorzolamide-timolol (COSOPT) 22.3-6.8 MG/ML ophthalmic solution Place 1 drop into both eyes 2 (two) times daily.    . feeding supplement, GLUCERNA SHAKE, (GLUCERNA SHAKE) LIQD Take 237 mLs by mouth 2 (two) times daily between meals. 30 Can 0  . furosemide (LASIX) 40 MG tablet Take 40 mg by mouth 2 (two) times daily.    . Melatonin 3 MG TABS Take 3-6 mg by mouth at bedtime as needed (sleep and agitation).    . memantine (NAMENDA) 10 MG tablet Take 5 mg by mouth See admin instructions. Titration: Take 5 mg by mouth at bedtime for 2 weeks, then 10 mg at bedtime for 2 weeks, then 15 mg at bedtime for 2 weeks, then 20 mg at bedtime (thereafter)    . Multiple Vitamin (  MULTIVITAMIN WITH MINERALS) TABS tablet Take 1 tablet by mouth daily. 30 tablet 0  . PRESCRIPTION MEDICATION 1 application See admin instructions. [employer]- Apply as directed to bilateral legs/waist area every morning for a period of 1 hour    . sacubitril-valsartan (ENTRESTO) 24-26 MG Take 1 tablet by mouth 2 (two) times daily. Please contact cardiology office for medication refills or prior authorization request. 60 tablet 0  . tamsulosin (FLOMAX) 0.4 MG CAPS capsule Take 1 capsule (0.4 mg total) by mouth daily. 30 capsule 0   No current facility-administered medications for this visit.      Allergies:   Patient has no known allergies.    Social History:  The patient  reports that he has never smoked. He has never used smokeless tobacco. He reports previous alcohol use. He reports previous drug use.   Family History:  The patient's family history includes Stroke in his mother.    ROS:  General:no colds or fevers, no weight changes Skin:no rashes or ulcers HEENT:no blurred vision, no congestion CV:see HPI PUL:see HPI GI:no diarrhea constipation or melena, no indigestion GU:no hematuria, no dysuria MS:no joint pain, no claudication Neuro:no syncope, no lightheadedness Endo:no diabetes, no thyroid disease  Wt Readings from Last 3 Encounters:  06/06/18 157 lb (71.2 kg)  05/29/18 158 lb (71.7 kg)  05/23/18 154 lb 12.2 oz (70.2 kg)     PHYSICAL EXAM: VS:  BP 126/76   Pulse 61   Ht 5\' 11"  (1.803 m)   Wt 157 lb (71.2 kg)   BMI 21.90 kg/m  , BMI Body mass index is 21.9 kg/m. General:Pleasant affect, NAD Skin:Warm and dry, brisk capillary refill HEENT:normocephalic, sclera clear, mucus membranes moist Neck:supple, mild JVD, no bruits  Heart:S1S2 RRR without murmur, gallup, rub or click Lungs:clear without rales, rhonchi, or wheezes OZD:GUYQ, non tender, + BS, do not palpate liver spleen or masses Ext:1-2+ lower ext edema, 1+ pedal pulses, 2+ radial pulses Neuro:alert and oriented X 3, MAE, follows commands, + facial symmetry    EKG:  EKG is NOT ordered today.    Recent Labs: 01/31/2018: TSH 7.371 05/14/2018: ALT 44; Magnesium 2.1 05/23/2018: BUN 17; Creatinine, Ser 1.28; Hemoglobin 12.1; Platelets 250; Potassium 3.9; Sodium 144    Lipid Panel    Component Value Date/Time   TRIG 37 05/11/2018 0217       Other studies Reviewed: Additional studies/ records that were reviewed today include: . Echo 05/11/18 IMPRESSIONS    1. The left ventricle had a visually estimated ejection fraction of of 30%. The cavity size was normal. There is moderate  asymmetric left ventricular hypertrophy.  2. Severe hypokinesis of the LV apex with relative sparing of the base.  3. The right ventricle has moderately reduced systolic function. The cavity was normal. There is no increase in right ventricular wall thickness.  4. Severe biatrial enlargement.  5. Mitral valve regurgitation is mild to moderate by color flow Doppler.  6. Tricuspid valve regurgitation is moderate.  7. Aortic valve regurgitation is mild by color flow Doppler.  8. 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  9. The inferior vena cava was dilated in size with <50% respiratory variability. Patient is on mechanical ventilator. 10. Trivial posterior pericardial effusion is present. 11. Moderate pleural effusion.  FINDINGS  Left Ventricle: The left ventricle has a visually estimated ejection fraction of of 30%. The cavity size was normal. There is moderate asymmetric left ventricular hypertrophy. Severe hypokinesis of the LV  apex with relative sparing of the base.    Right Ventricle: The right ventricle has moderately reduced systolic function. The cavity was normal. There is no increase in right ventricular wall thickness.  Left Atrium: Left atrial size was severely dilated.  Right Atrium: Right atrial size was severely dilated.  Pericardium: Trivial pericardial effusion is present. The pericardial effusion is posterior to the left ventricle. There is a moderate pleural effusion in either the left or right lateral region.  Mitral Valve: Mitral valve regurgitation is mild to moderate by color flow Doppler.  Tricuspid Valve: Tricuspid valve regurgitation is moderate by color flow Doppler.  Aortic Valve: Aortic valve regurgitation is mild by color flow Doppler. The vena contracta measures 0.25 cm.  Aorta: There is dilatation of the aortic root measuring 42 mm.  Venous: The inferior vena cava is dilated in size with less than 50% respiratory variability.   Compared to previous exam: The pericardial effusion is hemodynamically severe with tamponade.   ASSESSMENT AND PLAN:  1.  Uncontrolled HTN now with amlodipine and lasix improved BP.    2. Chronic systolic HF, will leave lasix at 40 bid for now and needs follow up visit, telehealth in 2 weeks.  Will check labs today.    3.  CM on BB  4. permanent a fib rate controlled today on eliquis  Pt has refused pacemaker in past.   5.  CKD -3 will check BMP on lasix at a higher dose.     Current medicines are reviewed with the patient today.  The patient Has no concerns regarding medicines.  The following changes have been made:  See above Labs/ tests ordered today include:see above  Disposition:   Carr:  see above  Signed, Tyler BoozerLaura Brina Umeda, NP  06/06/2018 4:35 PM    [facility] [address], [place], [place]  [address]/ [address] [place], [place] Phone: [phone]; Fax: [phone]  [phone]

## 2018-06-07 LAB — BASIC METABOLIC PANEL
BUN/Creatinine Ratio: 14 (ref 10–24)
BUN: 18 mg/dL (ref 8–27)
CO2: 25 mmol/L (ref 20–29)
Calcium: 9.3 mg/dL (ref 8.6–10.2)
Chloride: 103 mmol/L (ref 96–106)
Creatinine, Ser: 1.27 mg/dL (ref 0.76–1.27)
GFR calc Af Amer: 59 mL/min/{1.73_m2} — ABNORMAL LOW (ref >59)
GFR calc non Af Amer: 51 mL/min/{1.73_m2} — ABNORMAL LOW (ref >59)
Glucose: 84 mg/dL (ref 65–99)
Potassium: 3.6 mmol/L (ref 3.5–5.2)
Sodium: 144 mmol/L (ref 134–144)

## 2018-06-11 ENCOUNTER — Other Ambulatory Visit: Payer: Self-pay | Admitting: *Deleted

## 2018-06-11 MED ORDER — SACUBITRIL-VALSARTAN 24-26 MG PO TABS: 1.0000 | ORAL_TABLET | Freq: Two times a day (BID) | ORAL | 2 refills | Status: DC

## 2018-06-12 ENCOUNTER — Telehealth: Payer: Self-pay | Admitting: *Deleted

## 2018-06-12 ENCOUNTER — Encounter: Payer: Self-pay | Admitting: *Deleted

## 2018-06-12 NOTE — Telephone Encounter (Signed)
Tried to call pt to r/s his appt 06/17/2018, as Vernona Rieger is having to go to a different office. Voicemail is full. Will send a mychart message as well.

## 2018-06-13 ENCOUNTER — Telehealth (HOSPITAL_COMMUNITY): Payer: Self-pay

## 2018-06-13 NOTE — Telephone Encounter (Signed)
Will route to Dr. Anne Fu to see if ok for [facility] orders.

## 2018-06-13 NOTE — Telephone Encounter (Signed)
[facility] to get v/o for 2x week for 4 week continuation of PT, also requested Entresto refill.

## 2018-06-13 NOTE — Telephone Encounter (Signed)
Routed to [facility] as FYI since not seen in [facility]

## 2018-06-16 NOTE — Telephone Encounter (Signed)
Call placed to pt daughter, Charlynn Court, [place] on file.  Pt's appt has been changed from 06/17/2018 to 06/19/2018.

## 2018-06-16 NOTE — Telephone Encounter (Signed)
Dr Anne Fu does not order [facility] for PT.  Will need to contact PCP.

## 2018-06-16 NOTE — Telephone Encounter (Signed)
Pt Entresto 24-26 mg 1 po BID filled #60 X 2 06/11/2018.  He has upcoming scheduled f/u with Nada Boozer, NP.  The RX maybe changed at that time and can be reordered at that visit.

## 2018-06-16 NOTE — Telephone Encounter (Signed)
There is no phone number listed in this message to call back to speak with everyone from home health.  In previous notes there is a number listed of [phone] for Vernona Rieger with HH and [phone] for [place] with HH.  Will attempt to contact one of them during business hours tomorrow.

## 2018-06-17 ENCOUNTER — Telehealth: Payer: Medicare Other | Admitting: Cardiology

## 2018-06-17 NOTE — Progress Notes (Signed)
Virtual Visit via Video Note   This visit type was conducted due to national recommendations for restrictions regarding the COVID-19 Pandemic (e.g. social distancing) in an effort to limit this patient's exposure and mitigate transmission in our community.  Due to his co-morbid illnesses, this patient is at least at moderate risk for complications without adequate follow up.  This format is felt to be most appropriate for this patient at this time.  All issues noted in this document were discussed and addressed.  A limited physical exam was performed with this format.  Please refer to the patient's chart for his consent to telehealth for [facility].   Date:  06/19/2018   ID:  Tyler Carr, DOB 12-15-1930, MRN [id number]  Patient Location: Home Provider Location: Office  [facility]  Cardiologist:  Donato SchultzMark Skains, MD  Electrophysiologist:  None   Evaluation Performed:  Follow-Up Visit  Chief Complaint:  HTN  History of Present Illness:    Tyler Carr is a 85 y.o. male with  HTN with a history of diastolic CHF with EF of 50% in 01/2018, permanent atrial fibrillation with tachy-brady syndrome, hypertension s/p left renal artery stenting, hyperlipidemia, CKD stage III, and dementia, who was admitted on 05/09/2018 after an accidental overdosemultiple medications. Hospital course complicated by PEA arrest.Patient achieved ROSC after 12 minutes with 3 rounds of Epinephrine. Troponin peaked at 28.68.EKG showed permanent atrial fibrillation with chronic anterior Q wave. No prior ischemic evaluation in Epic and patient has no known heart disease. [facility] Echo showed EF of 30%, down from 50% in 01/2018 (secondary to either NSTEMI or possibly stress-induced cardiomyopathy).Given age, dementia, and comorbidities, he is not a candidate for ischemic evaluation. Plan is to treat medically.  On 05/29/18 he was stable.  Pt has called with BP to 194/102.   This was with home health.  06/06/18 with office visit with increase of Lasix to BID and amlodipine 2.5 mg daily he was improved.     I left his lasix at 40 mg BID for continued lower ext edema.   Labs checked and Cr 1.27 and K+ 3.6.    Today he has no chest pain or SOB.  He continues with lower ext edema.  + pitting but his wife states it is stable.   He does wear support stockings.  His salt is limited.    He does note he has lost vision in his Rt eye, happened overnight, his wife states he has had vision problems eye appt is in August.   No colds or fevers.   No cough.    The patient does not have symptoms concerning for COVID-19 infection (fever, chills, cough, or new shortness of breath).    Past Medical History:  Diagnosis Date  . CHF (congestive heart failure) (HCC)   . Dementia (HCC)   . Hyperlipidemia   . Hypertension    Past Surgical History:  Procedure Laterality Date  . ABDOMINAL AORTOGRAM N/A 02/04/2018   Procedure: ABDOMINAL AORTOGRAM;  Surgeon: Nada LibmanBrabham, Vance W, MD;  Location: [facility];  Service: Cardiovascular;  Laterality: N/A;  . HERNIA REPAIR    . PERIPHERAL VASCULAR INTERVENTION Left 02/04/2018   Procedure: PERIPHERAL VASCULAR INTERVENTION;  Surgeon: Nada LibmanBrabham, Vance W, MD;  Location: [facility];  Service: Cardiovascular;  Laterality: Left;  Renal artery     Current Meds  Medication Sig  . amLODipine (NORVASC) 5 MG tablet Take 2.5 mg by mouth daily.  .Marland Kitchen  apixaban (ELIQUIS) 5 MG TABS tablet Take 1 tablet (5 mg total) by mouth 2 (two) times daily.  Marland Kitchen atorvastatin (LIPITOR) 40 MG tablet Take 1 tablet (40 mg total) by mouth daily at 6 PM.  . brimonidine (ALPHAGAN) 0.15 % ophthalmic solution Place 1 drop into both eyes 2 (two) times a day.  . carvedilol (COREG) 3.125 MG tablet Take 1 tablet (3.125 mg total) by mouth 2 (two) times daily with a meal.  . dorzolamide-timolol (COSOPT) 22.3-6.8 MG/ML ophthalmic solution Place 1 drop into both eyes 2 (two) times  daily.  . feeding supplement, GLUCERNA SHAKE, (GLUCERNA SHAKE) LIQD Take 237 mLs by mouth 2 (two) times daily between meals.  . furosemide (LASIX) 40 MG tablet Take 40 mg by mouth 2 (two) times daily.  . memantine (NAMENDA) 10 MG tablet Take 5 mg by mouth See admin instructions. Titration: Take 5 mg by mouth at bedtime for 2 weeks, then 10 mg at bedtime for 2 weeks, then 15 mg at bedtime for 2 weeks, then 20 mg at bedtime (thereafter)  . Multiple Vitamin (MULTIVITAMIN WITH MINERALS) TABS tablet Take 1 tablet by mouth daily.  Marland Kitchen PRESCRIPTION MEDICATION 1 application See admin instructions. [employer]- Apply as directed to bilateral legs/waist area every morning for a period of 1 hour  . sacubitril-valsartan (ENTRESTO) 24-26 MG Take 1 tablet by mouth 2 (two) times daily. Please contact cardiology office for medication refills or prior authorization request.  . tamsulosin (FLOMAX) 0.4 MG CAPS capsule Take 1 capsule (0.4 mg total) by mouth daily.     Allergies:   Patient has no known allergies.   Social History   Tobacco Use  . Smoking status: Never Smoker  . Smokeless tobacco: Never Used  Substance Use Topics  . Alcohol use: Not Currently  . Drug use: Not Currently     Family Hx: The patient's family history includes Stroke in his mother.  ROS:   Please see the history of present illness.    General:no colds or fevers, no weight changes Skin:no rashes or ulcers HEENT:no blurred vision, no congestion loss of vision in the Rt eye CV:see HPI PUL:see HPI GI:no diarrhea constipation or melena, no indigestion GU:no hematuria, no dysuria MS:no joint pain, no claudication Neuro:no syncope, no lightheadedness, no slurred speech no balance issues. Endo:no diabetes, no thyroid disease  All other systems reviewed and are negative.   Prior CV studies:   The following studies were reviewed today:  Echo 05/11/18 IMPRESSIONS   1. The left ventricle had a  visually estimated ejection fraction of of 30%. The cavity size was normal. There is moderate asymmetric left ventricular hypertrophy. 2. Severe hypokinesis of the LV apex with relative sparing of the base. 3. The right ventricle has moderately reduced systolic function. The cavity was normal. There is no increase in right ventricular wall thickness. 4. Severe biatrial enlargement. 5. Mitral valve regurgitation is mild to moderate by color flow Doppler. 6. Tricuspid valve regurgitation is moderate. 7. Aortic valve regurgitation is mild by color flow Doppler. 8. 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 9. The inferior vena cava was dilated in size with <50% respiratory variability. Patient is on mechanical ventilator. 10. Trivial posterior pericardial effusion is present. 11. Moderate pleural effusion.  FINDINGS Left Ventricle: The left ventricle has a visually estimated ejection fraction of of 30%. The cavity size was normal. There is moderate asymmetric left ventricular hypertrophy. Severe hypokinesis of the LV apex with relative sparing of  the base.   Right Ventricle: The right ventricle has moderately reduced systolic function. The cavity was normal. There is no increase in right ventricular wall thickness.  Left Atrium: Left atrial size was severely dilated.  Right Atrium: Right atrial size was severely dilated.  Pericardium: Trivial pericardial effusion is present. The pericardial effusion is posterior to the left ventricle. There is a moderate pleural effusion in either the left or right lateral region.  Mitral Valve: Mitral valve regurgitation is mild to moderate by color flow Doppler.  Tricuspid Valve: Tricuspid valve regurgitation is moderate by color flow Doppler.  Aortic Valve: Aortic valve regurgitation is mild by color flow Doppler. The vena contracta measures 0.25 cm.  Aorta: There is dilatation of the aortic root measuring 42 mm.   Venous: The inferior vena cava is dilated in size with less than 50% respiratory variability.  Compared to previous exam: The pericardial effusion is hemodynamically severe with tamponade.   Labs/Other Tests and Data Reviewed:    EKG:  No ECG reviewed.  Recent Labs: 01/31/2018: TSH 7.371 05/14/2018: ALT 44; Magnesium 2.1 05/23/2018: Hemoglobin 12.1; Platelets 250 06/06/2018: BUN 18; Creatinine, Ser 1.27; Potassium 3.6; Sodium 144   Recent Lipid Panel Lab Results  Component Value Date/Time   TRIG 37 05/11/2018 02:17 AM    Wt Readings from Last 3 Encounters:  06/19/18 156 lb (70.8 kg)  06/06/18 157 lb (71.2 kg)  05/29/18 158 lb (71.7 kg)     Objective:    Vital Signs:  BP 137/71   Pulse 69   Ht  (1.803 m)   Wt 156 lb (70.8 kg)   BMI 21.76 kg/m    VITAL SIGNS:  reviewed  General Alert Male in NAD Neuro: A&O X 3 follows commands, + facial symmetry, tongue midline.MAE Lungs can speak in complete sentences without SOB no wheezes Ext. + edema 1-2+ support stockings are in place.   Psych: pleasant affect.  His GGranddaughter was with him and stated she brought him some potato chips.  We discussed  ASSESSMENT & PLAN:    1. HTN controlled - thought would be to stop amlodipine and add back hydralazine that was stopped at discharge.  Would add back at lower dose.  Follow up with Dr. Anne Fu in a month to eval meds. 2. Edema, he will take 80 in AM once then back to 40 BID will check BMP next week.  3.  chronic systolic HF stable except for lower ext edema 4. Permanent a fib. On eliquis.  Rate controlled.   COVID-19 Education: The signs and symptoms of COVID-19 were discussed with the patient and how to seek care for testing (follow up with PCP or arrange E-visit).  The importance of social distancing was discussed today.  Time:   Today, I have spent 15 minutes with the patient with telehealth technology discussing the above problems.     Medication Adjustments/Labs and  Tests Ordered: Current medicines are reviewed at length with the patient today.  Concerns regarding medicines are outlined above.   Tests Ordered: Orders Placed This Encounter  Procedures  . Basic metabolic panel    Medication Changes: No orders of the defined types were placed in this encounter.   Disposition:  Follow up in 4 week(s)  Signed, Nada Boozer, NP  06/19/2018 1:48 PM    [facility]

## 2018-06-17 NOTE — Telephone Encounter (Signed)
Kim at [facility], [place] [address] [phone] aware Dr Anne Fu requests these orders go to pt's PCP.

## 2018-06-19 ENCOUNTER — Other Ambulatory Visit: Payer: Self-pay

## 2018-06-19 ENCOUNTER — Encounter: Payer: Self-pay | Admitting: Cardiology

## 2018-06-19 ENCOUNTER — Telehealth (INDEPENDENT_AMBULATORY_CARE_PROVIDER_SITE_OTHER): Payer: Medicare Other | Admitting: Cardiology

## 2018-06-19 VITALS — BP 137/71 | HR 69 | Ht 71.0 in | Wt 156.0 lb

## 2018-06-19 DIAGNOSIS — I4819 Other persistent atrial fibrillation: Secondary | ICD-10-CM

## 2018-06-19 DIAGNOSIS — Z79899 Other long term (current) drug therapy: Secondary | ICD-10-CM

## 2018-06-19 DIAGNOSIS — I10 Essential (primary) hypertension: Secondary | ICD-10-CM

## 2018-06-19 DIAGNOSIS — I5022 Chronic systolic (congestive) heart failure: Secondary | ICD-10-CM

## 2018-06-19 NOTE — Patient Instructions (Addendum)
Medication Instructions:  Your physician recommends that you continue on your current medications as directed. Please refer to the Current Medication list given to you today BUT WE WOULD LIKE FOR Tyler Carr TO TAKE A EXTRA LASIX TOMORROW, 06/25/2018 WITH HIS CURRENT MORNING DOSE.  SHE HE WILL TAKE 2 OF THE FUROSEMIDES IN THE MORNING.   If you need a refill on your cardiac medications before your next appointment, please call your pharmacy.   Lab work: 06/25/2018 COME TO THE OFFICE AT 10:00 FOR A BMET  If you have labs (blood work) drawn today and your tests are completely normal, you will receive your results only by: Marland Kitchen MyChart Message (if you have MyChart) OR . A paper copy in the mail If you have any lab test that is abnormal or we need to change your treatment, we will call you to review the results.  Testing/Procedures: None ordered  Follow-Up: At [facility], you and your health needs are our priority.  As part of our continuing mission to provide you with exceptional heart care, we have created designated Provider Care Teams.  These Care Teams include your primary Cardiologist (physician) and Advanced Practice Providers (APPs -  Physician Assistants and Nurse Practitioners) who all work together to provide you with the care you need, when you need it. YOU ARE SCHEDULED FOR A VIRTUAL VIDEO APPT, LIKE YOU HAD TODAY, 08/04/2018 AT 3:00 WITH DR. Anne Fu.    Any Other Special Instructions Will Be Listed Below (If Applicable).

## 2018-06-23 ENCOUNTER — Telehealth: Payer: Self-pay | Admitting: *Deleted

## 2018-06-23 NOTE — Telephone Encounter (Signed)
    COVID-19 Pre-Screening Questions:  . In the past 7 to 10 days have you had a cough,  shortness of breath, headache, congestion, fever (100 or greater) body aches, chills, sore throat, or sudden loss of taste or sense of smell? . Have you been around anyone with known Covid 19. . Have you been around anyone who is awaiting Covid 19 test results in the past 7 to 10 days? . Have you been around anyone who has been exposed to Covid 19, or has mentioned symptoms of Covid 19 within the past 7 to 10 days?  If you have any concerns/questions about symptoms patients report during screening (either on the phone or at threshold). Contact the provider seeing the patient or DOD for further guidance.  If neither are available contact a member of the leadership team.           Contacted patient via phone call. No to all Covid 19 questions. Has a mask. KB  

## 2018-06-25 ENCOUNTER — Other Ambulatory Visit: Payer: Self-pay

## 2018-06-25 ENCOUNTER — Other Ambulatory Visit: Payer: Medicare Other | Admitting: *Deleted

## 2018-06-25 DIAGNOSIS — Z79899 Other long term (current) drug therapy: Secondary | ICD-10-CM

## 2018-06-25 LAB — BASIC METABOLIC PANEL
BUN/Creatinine Ratio: 20 (ref 10–24)
BUN: 23 mg/dL (ref 8–27)
CO2: 27 mmol/L (ref 20–29)
Calcium: 9.4 mg/dL (ref 8.6–10.2)
Chloride: 104 mmol/L (ref 96–106)
Creatinine, Ser: 1.14 mg/dL (ref 0.76–1.27)
GFR calc Af Amer: 67 mL/min/{1.73_m2} (ref >59)
GFR calc non Af Amer: 58 mL/min/{1.73_m2} — ABNORMAL LOW (ref >59)
Glucose: 87 mg/dL (ref 65–99)
Potassium: 3.7 mmol/L (ref 3.5–5.2)
Sodium: 145 mmol/L — ABNORMAL HIGH (ref 134–144)

## 2018-06-26 ENCOUNTER — Telehealth: Payer: Self-pay | Admitting: *Deleted

## 2018-06-26 ENCOUNTER — Telehealth (HOSPITAL_COMMUNITY): Payer: Self-pay | Admitting: *Deleted

## 2018-06-26 NOTE — Telephone Encounter (Signed)
-----   Message from Isaiah Serge, NP sent at 06/25/2018 11:05 PM EDT ----- Labs are stable.  Keep follow up with Dr. Marlou Porch.

## 2018-06-26 NOTE — Telephone Encounter (Signed)
homehealth orders signed and faxed

## 2018-06-26 NOTE — Telephone Encounter (Signed)
Call placed to pt re: lab results, vmail full.  Will try later.

## 2018-07-01 ENCOUNTER — Other Ambulatory Visit: Payer: Self-pay | Admitting: Cardiology

## 2018-07-01 MED ORDER — FUROSEMIDE 40 MG PO TABS: 40.0000 mg | ORAL_TABLET | Freq: Two times a day (BID) | ORAL | 3 refills | Status: DC

## 2018-07-01 NOTE — Telephone Encounter (Signed)
This was done today at 11:10AM for a 90 supply with 3 refills.

## 2018-07-01 NOTE — Telephone Encounter (Signed)
Pt's medication was sent to pt's pharmacy as requested. Confirmation received.  °

## 2018-07-01 NOTE — Telephone Encounter (Signed)
Pt daughter, Jerrell [place], [place] on file, has been made aware of pts lab results.

## 2018-07-01 NOTE — Telephone Encounter (Signed)
°*  STAT* If patient is at the pharmacy, call can be transferred to refill team.   1. Which medications need to be refilled? (please list name of each medication and dose if known)  furosemide (LASIX) 40 MG tablet  2. Which pharmacy/location (including street and city if local pharmacy) is medication to be sent to?    [place] [place] - [address] AT [address]      3. Do they need a 30 day or 90 day supply? [place], the nurse from the [facility] (his PCP) states the patient will not be able to get his Lasix from to [facility] for 10 Days, and the patient will run out of his medication tomorrow.  [facility] fill to last him until his meds come from the [place]

## 2018-08-01 ENCOUNTER — Telehealth: Payer: Self-pay | Admitting: Cardiology

## 2018-08-01 NOTE — Telephone Encounter (Signed)
New Message    Unable to leave message to confirm appt

## 2018-08-04 ENCOUNTER — Other Ambulatory Visit: Payer: Self-pay

## 2018-08-04 ENCOUNTER — Telehealth (INDEPENDENT_AMBULATORY_CARE_PROVIDER_SITE_OTHER): Payer: Medicare Other | Admitting: Cardiology

## 2018-08-04 ENCOUNTER — Encounter: Payer: Self-pay | Admitting: Cardiology

## 2018-08-04 VITALS — Ht 71.0 in | Wt 154.0 lb

## 2018-08-04 DIAGNOSIS — I4819 Other persistent atrial fibrillation: Secondary | ICD-10-CM | POA: Diagnosis not present

## 2018-08-04 DIAGNOSIS — I10 Essential (primary) hypertension: Secondary | ICD-10-CM | POA: Diagnosis not present

## 2018-08-04 DIAGNOSIS — N179 Acute kidney failure, unspecified: Secondary | ICD-10-CM

## 2018-08-04 DIAGNOSIS — I5022 Chronic systolic (congestive) heart failure: Secondary | ICD-10-CM

## 2018-08-04 NOTE — Patient Instructions (Signed)
Medication Instructions:  The current medical regimen is effective;  continue present plan and medications.  If you need a refill on your cardiac medications before your next appointment, please call your pharmacy.   Follow-Up: At [facility], you and your health needs are our priority.  As part of our continuing mission to provide you with exceptional heart care, we have created designated Provider Care Teams.  These Care Teams include your primary Cardiologist (physician) and Advanced Practice Providers (APPs -  Physician Assistants and Nurse Practitioners) who all work together to provide you with the care you need, when you need it. You will need a follow up appointment in 3 months with Cecilie Kicks, NP and Dr Marlou Porch in 6 months.  Please call our office 2 months in advance to schedule this appointment.  You may see Candee Furbish, MD or one of the following Advanced Practice Providers on your designated Care Team:   Truitt Merle, NP Cecilie Kicks, NP . Kathyrn Drown, NP  Thank you for choosing [facility]!!

## 2018-08-04 NOTE — Progress Notes (Signed)
Virtual Visit via Video Note   This visit type was conducted due to national recommendations for restrictions regarding the COVID-19 Pandemic (e.g. social distancing) in an effort to limit this patient's exposure and mitigate transmission in our community.  Due to his co-morbid illnesses, this patient is at least at moderate risk for complications without adequate follow up.  This format is felt to be most appropriate for this patient at this time.  All issues noted in this document were discussed and addressed.  A limited physical exam was performed with this format.  Please refer to the patient's chart for his consent to telehealth for [facility].   Date:  08/04/2018   ID:  Tyler Carr, DOB 04-03-1930, MRN [id number]  Patient Location: Home Provider Location: Home  [facility]  Cardiologist:  Donato SchultzMark Babara [place], MD  Electrophysiologist:  None   Evaluation Performed:  Follow-Up Visit  Chief Complaint: Follow-up edema  History of Present Illness:    Tyler GuardianMarvin Carr is a 85 y.o. male diastolic heart failure, permanent atrial fibrillation, tachycardia-bradycardia syndrome, left renal artery stent, dementia.  He had a PEA arrest in the hospital.  After 12 minutes and 3 rounds of epinephrine retrieved spontaneous circulation.  Troponin was 28.  E[facility] 30% [facility].  Did not feel as though he was a candidate for ischemic evaluation.  The patient does not have symptoms concerning for COVID-19 infection (fever, chills, cough, or new shortness of breath).    Past Medical History:  Diagnosis Date  . CHF (congestive heart failure) (HCC)   . Dementia (HCC)   . Hyperlipidemia   . Hypertension    Past Surgical History:  Procedure Laterality Date  . ABDOMINAL AORTOGRAM N/A 02/04/2018   Procedure: ABDOMINAL AORTOGRAM;  Surgeon: Nada LibmanBrabham, Vance W, MD;  [facility];  Service: Cardiovascular;  Laterality: N/A;  . HERNIA REPAIR    . PERIPHERAL  VASCULAR INTERVENTION Left 02/04/2018   Procedure: PERIPHERAL VASCULAR INTERVENTION;  Surgeon: Nada LibmanBrabham, Vance W, MD;  Location: [facility];  Service: Cardiovascular;  Laterality: Left;  Renal artery     Current Meds  Medication Sig  . amLODipine (NORVASC) 5 MG tablet Take 2.5 mg by mouth daily.  Marland Kitchen. apixaban (ELIQUIS) 5 MG TABS tablet Take 1 tablet (5 mg total) by mouth 2 (two) times daily.  Marland Kitchen. atorvastatin (LIPITOR) 40 MG tablet Take 1 tablet (40 mg total) by mouth daily at 6 PM.  . brimonidine (ALPHAGAN) 0.15 % ophthalmic solution Place 1 drop into both eyes 2 (two) times a day.  . carvedilol (COREG) 3.125 MG tablet Take 1 tablet (3.125 mg total) by mouth 2 (two) times daily with a meal.  . dorzolamide-timolol (COSOPT) 22.3-6.8 MG/ML ophthalmic solution Place 1 drop into both eyes 2 (two) times daily.  . feeding supplement, GLUCERNA SHAKE, (GLUCERNA SHAKE) LIQD Take 237 mLs by mouth 2 (two) times daily between meals.  . furosemide (LASIX) 40 MG tablet Take 1 tablet (40 mg total) by mouth 2 (two) times daily.  . memantine (NAMENDA) 10 MG tablet Take 5 mg by mouth See admin instructions. Titration: Take 5 mg by mouth at bedtime for 2 weeks, then 10 mg at bedtime for 2 weeks, then 15 mg at bedtime for 2 weeks, then 20 mg at bedtime (thereafter)  . Multiple Vitamin (MULTIVITAMIN WITH MINERALS) TABS tablet Take 1 tablet by mouth daily.  Marland Kitchen. PRESCRIPTION MEDICATION 1 application See admin instructions. [employer]  Machine- Apply as directed to bilateral legs/waist area every morning for a period of 1 hour  . sacubitril-valsartan (ENTRESTO) 24-26 MG Take 1 tablet by mouth 2 (two) times daily. Please contact cardiology office for medication refills or prior authorization request.  . tamsulosin (FLOMAX) 0.4 MG CAPS capsule Take 1 capsule (0.4 mg total) by mouth daily.     Allergies:   Patient has no known allergies.   Social History   Tobacco Use  . Smoking status: Never Smoker   . Smokeless tobacco: Never Used  Substance Use Topics  . Alcohol use: Not Currently  . Drug use: Not Currently     Family Hx: The patient's family history includes Stroke in his mother.  ROS:   Please see the history of present illness.    Still with memory issues.  No fevers chills nausea vomiting syncope bleeding All other systems reviewed and are negative.   Prior CV studies:   The following studies were reviewed today:  Echocardiogram reviewed as above.  Labs/Other Tests and Data Reviewed:    EKG:  Atrial fibrillation well rate controlled  Recent Labs: 01/31/2018: TSH 7.371 05/14/2018: ALT 44; Magnesium 2.1 05/23/2018: Hemoglobin 12.1; Platelets 250 06/25/2018: BUN 23; Creatinine, Ser 1.14; Potassium 3.7; Sodium 145   Recent Lipid Panel Lab Results  Component Value Date/Time   TRIG 37 05/11/2018 02:17 AM    Wt Readings from Last 3 Encounters:  08/04/18 154 lb (69.9 kg)  06/19/18 156 lb (70.8 kg)  06/06/18 157 lb (71.2 kg)     Objective:    Vital Signs:  Ht 5\' 11"  (1.803 m)   Wt 154 lb (69.9 kg)   BMI 21.48 kg/m    VITAL SIGNS:  reviewed  Pleasant, able to complete full sentences without difficulty  ASSESSMENT & PLAN:    Difficult to control hypertension -Amlodipine, Lasix improved  Chronic systolic heart failure - Lasix, Entresto, carvedilol, doing very well.  Dilated cardiomyopathy -Beta-blocker  Permanent atrial fibrillation -Eliquis, refused a pacemaker previously  CKD 3 - Creatinine 1.14 from 1.27.  Stable on current medications.  No changes made.  COVID-19 Education: The signs and symptoms of COVID-19 were discussed with the patient and how to seek care for testing (follow up with PCP or arrange E-visit).  The importance of social distancing was discussed today.  Time:   Today, I have spent 15 minutes with the patient with telehealth technology discussing the above problems.     Medication Adjustments/Labs and Tests Ordered: Current  medicines are reviewed at length with the patient today.  Concerns regarding medicines are outlined above.   Tests Ordered: No orders of the defined types were placed in this encounter.   Medication Changes: No orders of the defined types were placed in this encounter.   Follow Up:  Virtual Visit in 3 month(s)  Signed, Candee Furbish, MD  08/04/2018 5:25 PM    [facility]

## 2018-08-18 ENCOUNTER — Emergency Department (HOSPITAL_COMMUNITY)
Admission: EM | Admit: 2018-08-18 | Discharge: 2018-08-19 | Disposition: A | Discharge status: Home / Self Care | Payer: Medicare Other | Attending: Emergency Medicine | Admitting: Emergency Medicine

## 2018-08-18 ENCOUNTER — Other Ambulatory Visit: Payer: Self-pay

## 2018-08-18 ENCOUNTER — Emergency Department (HOSPITAL_COMMUNITY): Payer: Medicare Other

## 2018-08-18 DIAGNOSIS — F0150 Vascular dementia without behavioral disturbance: Secondary | ICD-10-CM | POA: Diagnosis not present

## 2018-08-18 DIAGNOSIS — I5023 Acute on chronic systolic (congestive) heart failure: Secondary | ICD-10-CM | POA: Diagnosis not present

## 2018-08-18 DIAGNOSIS — I10 Essential (primary) hypertension: Secondary | ICD-10-CM | POA: Diagnosis not present

## 2018-08-18 DIAGNOSIS — I4891 Unspecified atrial fibrillation: Secondary | ICD-10-CM | POA: Diagnosis not present

## 2018-08-18 DIAGNOSIS — Z79899 Other long term (current) drug therapy: Secondary | ICD-10-CM | POA: Insufficient documentation

## 2018-08-18 DIAGNOSIS — Z7901 Long term (current) use of anticoagulants: Secondary | ICD-10-CM | POA: Diagnosis not present

## 2018-08-18 DIAGNOSIS — R079 Chest pain, unspecified: Secondary | ICD-10-CM | POA: Diagnosis present

## 2018-08-18 LAB — TROPONIN I (HIGH SENSITIVITY)
Troponin I (High Sensitivity): 17 ng/L (ref <18)
Troponin I (High Sensitivity): 22 ng/L — ABNORMAL HIGH (ref <18)

## 2018-08-18 LAB — BASIC METABOLIC PANEL
Anion gap: 9 (ref 5–15)
BUN: 26 mg/dL — ABNORMAL HIGH (ref 8–23)
CO2: 28 mmol/L (ref 22–32)
Calcium: 9.3 mg/dL (ref 8.9–10.3)
Chloride: 107 mmol/L (ref 98–111)
Creatinine, Ser: 1.32 mg/dL — ABNORMAL HIGH (ref 0.61–1.24)
GFR calc Af Amer: 57 mL/min — ABNORMAL LOW (ref >60)
GFR calc non Af Amer: 49 mL/min — ABNORMAL LOW (ref >60)
Glucose, Bld: 75 mg/dL (ref 70–99)
Potassium: 3.9 mmol/L (ref 3.5–5.1)
Sodium: 144 mmol/L (ref 135–145)

## 2018-08-18 LAB — BRAIN NATRIURETIC PEPTIDE: B Natriuretic Peptide: 1237.7 pg/mL — ABNORMAL HIGH (ref 0.0–100.0)

## 2018-08-18 LAB — CBC
HCT: 41.5 % (ref 39.0–52.0)
Hemoglobin: 13.1 g/dL (ref 13.0–17.0)
MCH: 29.6 pg (ref 26.0–34.0)
MCHC: 31.6 g/dL (ref 30.0–36.0)
MCV: 93.9 fL (ref 80.0–100.0)
Platelets: 155 10*3/uL (ref 150–400)
RBC: 4.42 MIL/uL (ref 4.22–5.81)
RDW: 15.8 % — ABNORMAL HIGH (ref 11.5–15.5)
WBC: 4.8 10*3/uL (ref 4.0–10.5)
nRBC: 0 % (ref 0.0–0.2)

## 2018-08-18 LAB — LIPASE, BLOOD: Lipase: 33 U/L (ref 11–51)

## 2018-08-18 MED ORDER — FUROSEMIDE 10 MG/ML IJ SOLN
40.0000 mg | Freq: Once | INTRAMUSCULAR | Status: AC
  Administered 2018-08-18: 40 mg via INTRAVENOUS
  Filled 2018-08-18: qty 4

## 2018-08-18 NOTE — ED Provider Notes (Signed)
[facility] Provider Note   CSN: [id number] Arrival date & time: 08/18/18  1541     History   Chief Complaint Chief Complaint  Patient presents with   Chest Pain    HPI Harrietta GuardianMarvin Malizia is a 85 y.o. male.     HPI  85 year old male comes in a chief complaint of chest pain. He has history of hypertension, hyperlipidemia, CHF and dementia. Patient reports that over the past few days he has noted increased swelling in his chest area.  The swelling has come down today, but there has been some mild discomfort in the lower part of his chest area prompting him to come to the ER.  Patient denies any shortness of breath, orthopnea or paroxysmal nocturnal dyspnea-like symptoms.  He also denies any new cough.  I spoke with patient's daughter.  She reiterated the history given by the patient.   Past Medical History:  Diagnosis Date   CHF (congestive heart failure) (HCC)    Dementia (HCC)    Hyperlipidemia    Hypertension     Patient Active Problem List   Diagnosis Date Noted   Acute on chronic combined systolic and diastolic CHF (congestive heart failure) (HCC)    AKI (acute kidney injury) (HCC)    Vascular dementia without behavioral disturbance (HCC)    Cardiac arrest (HCC) 05/11/2018   Drug overdose, accidental or unintentional, initial encounter 05/09/2018   Hypotension 05/09/2018   Accelerated hypertension    Symptomatic bradycardia 01/30/2018   CHF (congestive heart failure) (HCC)    Hypertension    Hyperlipidemia    Atrial fibrillation with slow ventricular response (HCC)     Past Surgical History:  Procedure Laterality Date   ABDOMINAL AORTOGRAM N/A 02/04/2018   Procedure: ABDOMINAL AORTOGRAM;  Surgeon: Nada LibmanBrabham, Vance W, MD;  [facility];  Service: Cardiovascular;  Laterality: N/A;   HERNIA REPAIR     PERIPHERAL VASCULAR INTERVENTION Left 02/04/2018   Procedure: PERIPHERAL VASCULAR  INTERVENTION;  Surgeon: Nada LibmanBrabham, Vance W, MD;  [facility];  Service: Cardiovascular;  Laterality: Left;  Renal artery        Home Medications    Prior to Admission medications   Medication Sig Start Date End Date Taking? Authorizing Provider  amLODipine (NORVASC) 5 MG tablet Take 2.5 mg by mouth daily. 06/02/18   [provider]  apixaban (ELIQUIS) 5 MG TABS tablet Take 1 tablet (5 mg total) by mouth 2 (two) times daily. 05/29/18   Bhagat, Sharrell KuBhavinkumar, PA  atorvastatin (LIPITOR) 40 MG tablet Take 1 tablet (40 mg total) by mouth daily at 6 PM. 05/29/18   Bhagat, Bhavinkumar, PA  brimonidine (ALPHAGAN) 0.15 % ophthalmic solution Place 1 drop into both eyes 2 (two) times a day.    [provider]  carvedilol (COREG) 3.125 MG tablet Take 1 tablet (3.125 mg total) by mouth 2 (two) times daily with a meal. 05/29/18   Bhagat, Bhavinkumar, PA  dorzolamide-timolol (COSOPT) 22.3-6.8 MG/ML ophthalmic solution Place 1 drop into both eyes 2 (two) times daily.    [provider]  feeding supplement, GLUCERNA SHAKE, (GLUCERNA SHAKE) LIQD Take 237 mLs by mouth 2 (two) times daily between meals. 05/14/18   Albertine GratesXu, Fang, MD  furosemide (LASIX) 40 MG tablet Take 1 tablet (40 mg total) by mouth 2 (two) times daily. 07/01/18   Jake BatheSkains, Mark C, MD  memantine (NAMENDA) 10 MG tablet Take 5 mg by mouth See admin instructions. Titration: Take 5 mg  by mouth at bedtime for 2 weeks, then 10 mg at bedtime for 2 weeks, then 15 mg at bedtime for 2 weeks, then 20 mg at bedtime (thereafter) 04/21/18   [provider]  Multiple Vitamin (MULTIVITAMIN WITH MINERALS) TABS tablet Take 1 tablet by mouth daily. 05/15/18   Florencia Reasons, MD  PRESCRIPTION MEDICATION 1 application See admin instructions. [place] as directed to bilateral legs/waist area every morning for a period of 1 hour    [provider]  sacubitril-valsartan (ENTRESTO) 24-26 MG Take 1  tablet by mouth 2 (two) times daily. Please contact cardiology office for medication refills or prior authorization request. 06/11/18   Jerline Pain, MD  tamsulosin (FLOMAX) 0.4 MG CAPS capsule Take 1 capsule (0.4 mg total) by mouth daily. 02/06/18   Cristal Ford, DO    Family History Family History  Problem Relation Age of Onset   Stroke Mother     Social History Social History   Tobacco Use   Smoking status: Never Smoker   Smokeless tobacco: Never Used  Substance Use Topics   Alcohol use: Not Currently   Drug use: Not Currently     Allergies   Patient has no known allergies.   Review of Systems Review of Systems  Constitutional: Negative for activity change.  Respiratory: Negative for cough and shortness of breath.   Cardiovascular: Positive for chest pain.  Neurological: Negative for dizziness.  All other systems reviewed and are negative.    Physical Exam Updated Vital Signs BP (!) 182/79    Pulse 63    Temp 97.6 F (36.4 C) (Oral)    Resp 19    SpO2 100%   Physical Exam Vitals signs and nursing note reviewed.  Constitutional:      Appearance: He is well-developed.  HENT:     Head: Atraumatic.  Neck:     Musculoskeletal: Neck supple.  Cardiovascular:     Rate and Rhythm: Normal rate.     Heart sounds: Murmur present.  Pulmonary:     Effort: Pulmonary effort is normal.  Musculoskeletal:     Right lower leg: Edema present.     Left lower leg: Edema present.  Skin:    General: Skin is warm.  Neurological:     Mental Status: He is alert and oriented to person, place, and time.     ED Treatments / Results  Labs (all labs ordered are listed, but only abnormal results are displayed) Labs Reviewed  BASIC METABOLIC PANEL - Abnormal; Notable for the following components:      Result Value   BUN 26 (*)    Creatinine, Ser 1.32 (*)    GFR calc non Af Amer 49 (*)    GFR calc Af Amer 57 (*)    All other components within normal limits  CBC -  Abnormal; Notable for the following components:   RDW 15.8 (*)    All other components within normal limits  BRAIN NATRIURETIC PEPTIDE - Abnormal; Notable for the following components:   B Natriuretic Peptide 1,237.7 (*)    All other components within normal limits  TROPONIN I (HIGH SENSITIVITY) - Abnormal; Notable for the following components:   Troponin I (High Sensitivity) 22 (*)    All other components within normal limits  LIPASE, BLOOD  TROPONIN I (HIGH SENSITIVITY)    EKG EKG Interpretation  Date/Time:  Monday August 18 2018 15:50:06 EDT Ventricular Rate:  70 PR Interval:    QRS Duration:  96 QT Interval:  398 QTC Calculation: 429 R Axis:   97 Text Interpretation:  Atrial fibrillation Rightward axis Anteroseptal infarct , age undetermined T wave abnormality, consider lateral ischemia Abnormal ECG No acute changes No significant change since last tracing Confirmed by Derwood Kaplananavati, Kriston Mckinnie [phone]) on 08/18/2018 8:06:20 PM   Radiology Dg Chest 2 View  Result Date: 08/18/2018 CLINICAL DATA:  Chest pain, right subcostal EXAM: CHEST - 2 VIEW COMPARISON:  Radiograph May 23, 2018 FINDINGS: Streaky bibasilar opacities, right greater left with trace right effusion. No evidence of edema. No pneumothorax. Cardiac silhouette is enlarged with coronary calcifications present. The aorta is slightly tortuous otherwise the cardiomediastinal contours are unremarkable. Indistinct ovoid mineralization adjacent the lateral left seventh rib. Degenerative changes in the spine. IMPRESSION: 1. Streaky bibasilar opacities, right greater than left likely reflect atelectasis, with trace right pleural effusion. 2. Cardiomegaly. 3. Indeterminate mineralization adjacent the lateral left seventh rib, could reflect projectional artifact versus callus formation fracture embolizations tissue. Correlate for point tenderness/palpable abnormality in this location. Electronically Signed   By: Kreg ShropshirePrice  DeHay M.D.   On: 08/18/2018  16:39    Procedures Procedures (including critical care time)  Medications Ordered in ED Medications  furosemide (LASIX) injection 40 mg (has no administration in time range)     Initial Impression / Assessment and Plan / ED Course  I have reviewed the triage vital signs and the nursing notes.  Pertinent labs & imaging results that were available during my care of the patient were reviewed by me and considered in my medical decision making (see chart for details).        Patient comes in a chief complaint of chest discomfort. He has history of CHF.  He reports that he had some increased swelling that he appreciated over the lower part of his chest.  He is feeling better as far swelling is concerned, but wanted to make sure everything was fine.  He has pitting edema and is noted in A. fib.  He it appears that he is in permanent A. fib.  He does not have JVD and on exam he has mild rales at the base.  Patient denies any orthopnea or PND.  I discussed the case with patient's daughter who also supports patient's history.  Clinically there is no signs of infection.  Labs reveal mildly elevated troponin, which is not because of MI.  EKG is not showing any ischemic findings.  Patient's BNP is elevated, but we think it would be okay to send him home with extra Lasix and outpatient PCP follow-up at this time.    Final Clinical Impressions(s) / ED Diagnoses   Final diagnoses:  Acute on chronic systolic congestive heart failure, NYHA class 3 (HCC)  Atrial fibrillation, unspecified type [facility](HCC)    ED Discharge Orders    None       Derwood KaplanNanavati, Jaris Kohles, MD 08/18/18 2309

## 2018-08-18 NOTE — Discharge Instructions (Addendum)
We saw in the ER for chest discomfort. Our evaluation shows that there is some worsening of CHF.  Fortunately, no evidence of heart attack and all your other labs are also reassuring.  We recommend that you increase your Lasix /furosemide (the water pill) to 3 times a day for the next 3 days. We would like you to see your primary care doctor in 5 to 7 days.  Please return to the ER immediately if you start having worsening shortness of breath, chest pain, dizziness or near fainting.

## 2018-08-18 NOTE — ED Notes (Signed)
Please call Youlanda Roys daughter @ [phone] a status update--Tyler Carr

## 2018-08-18 NOTE — ED Triage Notes (Signed)
Pt endorses right sided pain under the ribs occasionally for a while. Denies shob, n/v/d. VSS

## 2018-08-19 ENCOUNTER — Other Ambulatory Visit: Payer: Self-pay | Admitting: Cardiology

## 2018-11-11 NOTE — Progress Notes (Signed)
Cardiology Office Note   Date:  11/13/2018   ID:  Tyler Carr, DOB June 17, 1930, MRN [id number]  [facility]  Cardiologist:  Dr. Anne FuSkains    Chief Complaint  Patient presents with  . Congestive Heart Failure    diastolic       History of Present Illness: Tyler GuardianMarvin Homeyer is a 85 y.o. male who presents for his hx of diastolic heart failure, permanent atrial fibrillation, tachycardia-bradycardia syndrome, left renal artery stent, dementia.  He had a PEA arrest in the hospital.  After 12 minutes and 3 rounds of epinephrine retrieved spontaneous circulation.  Troponin was 28.  Echo showed 30% EF down from 50 at the [facility].  Did not feel as though he was a candidate for ischemic evaluation.  Seen in ER 08/18/18 with chest pain. HS troponin 22 and 17, no MI BNP 1237 Cr 1.32, hgb 13.1 lipase 33 CXR tr rt pl effusion  Pt given extra lasix. 40 IV , EKG without acute changes.   Today he feels well. No chest pain , no SOB and he does have lower ext edema he does eat some salt at time, he tries not to though.  His family is with him he did not know what meds he is on, PCP follows lipids.  overall he feels well. He has support stockings but not always able to put on.     Past Medical History:  Diagnosis Date  . CHF (congestive heart failure) (HCC)   . Dementia (HCC)   . Hyperlipidemia   . Hypertension     Past Surgical History:  Procedure Laterality Date  . ABDOMINAL AORTOGRAM N/A 02/04/2018   Procedure: ABDOMINAL AORTOGRAM;  Surgeon: Nada LibmanBrabham, Vance W, MD;  Location: [facility];  Service: Cardiovascular;  Laterality: N/A;  . HERNIA REPAIR    . PERIPHERAL VASCULAR INTERVENTION Left 02/04/2018   Procedure: PERIPHERAL VASCULAR INTERVENTION;  Surgeon: Nada LibmanBrabham, Vance W, MD;  Location: [facility];  Service: Cardiovascular;  Laterality: Left;  Renal artery     Current Outpatient Medications  Medication Sig Dispense Refill  . amLODipine (NORVASC) 5 MG tablet Take  2.5 mg by mouth daily.    Marland Kitchen. apixaban (ELIQUIS) 5 MG TABS tablet Take 1 tablet (5 mg total) by mouth 2 (two) times daily. 180 tablet 3  . atorvastatin (LIPITOR) 40 MG tablet Take 1 tablet (40 mg total) by mouth daily at 6 PM. 90 tablet 3  . brimonidine (ALPHAGAN) 0.15 % ophthalmic solution Place 1 drop into both eyes 2 (two) times a day.    . carvedilol (COREG) 3.125 MG tablet Take 1 tablet (3.125 mg total) by mouth 2 (two) times daily with a meal. 180 tablet 3  . donepezil (ARICEPT) 5 MG tablet Take 5 mg by mouth at bedtime. Take 2 tabs    . dorzolamide-timolol (COSOPT) 22.3-6.8 MG/ML ophthalmic solution Place 1 drop into both eyes 2 (two) times daily.    Marland Kitchen. ENTRESTO 24-26 MG TAKE 1 TABLET BY MOUTH TWICE DAILY 180 tablet 3  . furosemide (LASIX) 40 MG tablet Take 1 tablet (40 mg total) by mouth 2 (two) times daily. 180 tablet 3  . memantine (NAMENDA) 10 MG tablet Take 5 mg by mouth See admin instructions. Titration: Take 5 mg by mouth at bedtime for 2 weeks, then 10 mg at bedtime for 2 weeks, then 15 mg at bedtime for 2 weeks, then 20 mg at bedtime (thereafter)    . Multiple Vitamin (MULTIVITAMIN WITH MINERALS)  TABS tablet Take 1 tablet by mouth daily. 30 tablet 0  . PRESCRIPTION MEDICATION 1 application See admin instructions. [place] as directed to bilateral legs/waist area every morning for a period of 1 hour    . tamsulosin (FLOMAX) 0.4 MG CAPS capsule Take 1 capsule (0.4 mg total) by mouth daily. 30 capsule 0   No current facility-administered medications for this visit.     Allergies:   Patient has no known allergies.    Social History:  The patient  reports that he has never smoked. He has never used smokeless tobacco. He reports previous alcohol use. He reports previous drug use.   Family History:  The patient's family history includes Stroke in his mother.    ROS:  General:no colds or fevers, +  weight increase Skin:no rashes or ulcers HEENT:no  blurred vision, no congestion CV:see HPI PUL:see HPI GI:no diarrhea constipation or melena, no indigestion GU:no hematuria, no dysuria MS:no joint pain, no claudication Neuro:no syncope, no lightheadedness Endo:no diabetes, no thyroid disease  Wt Readings from Last 3 Encounters:  11/12/18 160 lb 12.8 oz (72.9 kg)  08/04/18 154 lb (69.9 kg)  06/19/18 156 lb (70.8 kg)     PHYSICAL EXAM: VS:  BP 118/70   Pulse (!) 58   Ht 5\' 11"  (1.803 m)   Wt 160 lb 12.8 oz (72.9 kg)   SpO2 99%   BMI 22.43 kg/m  , BMI Body mass index is 22.43 kg/m. General:Pleasant affect, NAD Skin:Warm and dry, brisk capillary refill HEENT:normocephalic, sclera clear, mucus membranes moist Neck:supple, no JVD, no bruits  Heart:irreg irreg without murmur, gallup, rub or click Lungs:clear without rales, rhonchi, or wheezes DGL:OVFI, non tender, + BS, do not palpate liver spleen or masses Ext:2+ lower ext edema, 2+ pedal pulses, 2+ radial pulses Neuro:alert and oriented X 3 with poor memory , MAE, follows commands, + facial symmetry    EKG:  EKG is ordered today. The ekg ordered today demonstrates atrial fib with slower ventricular response, old ant MI but no ST changes and actual improvement of T wave inversion lateral leads.     Recent Labs: 01/31/2018: TSH 7.371 05/14/2018: ALT 44; Magnesium 2.1 08/18/2018: B Natriuretic Peptide 1,237.7; BUN 26; Creatinine, Ser 1.32; Hemoglobin 13.1; Platelets 155; Potassium 3.9; Sodium 144    Lipid Panel    Component Value Date/Time   TRIG 37 05/11/2018 0217       Other studies Reviewed: Additional studies/ records that were reviewed today include: . Echo 05/11/18 IMPRESSIONS    1. The left ventricle had a visually estimated ejection fraction of of 30%. The cavity size was normal. There is moderate asymmetric left ventricular hypertrophy.  2. Severe hypokinesis of the LV apex with relative sparing of the base.  3. The right ventricle has moderately reduced  systolic function. The cavity was normal. There is no increase in right ventricular wall thickness.  4. Severe biatrial enlargement.  5. Mitral valve regurgitation is mild to moderate by color flow Doppler.  6. Tricuspid valve regurgitation is moderate.  7. Aortic valve regurgitation is mild by color flow Doppler.  8. 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  9. The inferior vena cava was dilated in size with <50% respiratory variability. Patient is on mechanical ventilator. 10. Trivial posterior pericardial effusion is present. 11. Moderate pleural effusion.  FINDINGS  Left Ventricle: The left ventricle has a visually estimated ejection fraction of of 30%. The cavity size was normal. There  is moderate asymmetric left ventricular hypertrophy. Severe hypokinesis of the LV apex with relative sparing of the base.    Right Ventricle: The right ventricle has moderately reduced systolic function. The cavity was normal. There is no increase in right ventricular wall thickness.  Left Atrium: Left atrial size was severely dilated.  Right Atrium: Right atrial size was severely dilated.  Pericardium: Trivial pericardial effusion is present. The pericardial effusion is posterior to the left ventricle. There is a moderate pleural effusion in either the left or right lateral region.  Mitral Valve: Mitral valve regurgitation is mild to moderate by color flow Doppler.  Tricuspid Valve: Tricuspid valve regurgitation is moderate by color flow Doppler.  Aortic Valve: Aortic valve regurgitation is mild by color flow Doppler. The vena contracta measures 0.25 cm.  Aorta: There is dilatation of the aortic root measuring 42 mm.  Venous: The inferior vena cava is dilated in size with less than 50% respiratory variability.  Compared to previous exam: The pericardial effusion is hemodynamically severe with tamponade.      ASSESSMENT AND PLAN:  1.  HTN at times difficult to  control.  Today is stable. No change in meds  2.  Permanent a fib with occ RVR but slow at times.  Asked him to call if lightheaded or dizzy- he has not wanted a PPM in past and still is not interested.  3.  Chronic systolic HF on lasix entresto, coreg and stable. In August was seen in ER and rec'd IV lasix and has done well since.  BNP then 1237  4.  Dilated cardiomyopathy on BB    5.  CKD-3 stable. Cr 1.32   Follow up with Dr. Anne Fu in 3 months.    Current medicines are reviewed with the patient today.  The patient Has no concerns regarding medicines.  The following changes have been made:  See above Labs/ tests ordered today include:see above  Disposition:   FU:  see above  Signed, Nada Boozer, NP  11/13/2018 10:19 PM    [facility] [address] [place], [place], [place]  [address]/ [address] [place], [place] Phone: [phone]; Fax: [phone]  [phone]

## 2018-11-12 ENCOUNTER — Other Ambulatory Visit: Payer: Self-pay

## 2018-11-12 ENCOUNTER — Encounter: Payer: Self-pay | Admitting: Cardiology

## 2018-11-12 ENCOUNTER — Ambulatory Visit (INDEPENDENT_AMBULATORY_CARE_PROVIDER_SITE_OTHER): Payer: Medicare Other | Admitting: Cardiology

## 2018-11-12 VITALS — BP 118/70 | HR 58 | Ht 71.0 in | Wt 160.8 lb

## 2018-11-12 DIAGNOSIS — I4819 Other persistent atrial fibrillation: Secondary | ICD-10-CM | POA: Diagnosis not present

## 2018-11-12 DIAGNOSIS — I10 Essential (primary) hypertension: Secondary | ICD-10-CM

## 2018-11-12 DIAGNOSIS — I5022 Chronic systolic (congestive) heart failure: Secondary | ICD-10-CM

## 2018-11-12 DIAGNOSIS — N183 Chronic kidney disease, stage 3 unspecified: Secondary | ICD-10-CM

## 2018-11-12 DIAGNOSIS — I420 Dilated cardiomyopathy: Secondary | ICD-10-CM

## 2018-11-12 NOTE — Patient Instructions (Signed)
Medication Instructions:  Your physician recommends that you continue on your current medications as directed. Please refer to the Current Medication list given to you today.  *If you need a refill on your cardiac medications before your next appointment, please call your pharmacy*  Lab Work: None ordered  If you have labs (blood work) drawn today and your tests are completely normal, you will receive your results only by: Marland Kitchen MyChart Message (if you have MyChart) OR . A paper copy in the mail If you have any lab test that is abnormal or we need to change your treatment, we will call you to review the results.  Testing/Procedures: None ordered  Follow-Up: At [facility], you and your health needs are our priority.  As part of our continuing mission to provide you with exceptional heart care, we have created designated Provider Care Teams.  These Care Teams include your primary Cardiologist (physician) and Advanced Practice Providers (APPs -  Physician Assistants and Nurse Practitioners) who all work together to provide you with the care you need, when you need it.  Your next appointment:   3 months   02/15/2018 arrive at 9:05 for registration  The format for your next appointment:   In Person  Provider:   Candee Furbish, MD  Other Instructions

## 2018-11-13 ENCOUNTER — Encounter: Payer: Self-pay | Admitting: Cardiology

## 2019-02-15 ENCOUNTER — Ambulatory Visit: Payer: Medicare Other

## 2019-02-16 ENCOUNTER — Ambulatory Visit: Payer: Medicare Other | Admitting: Cardiology

## 2019-02-20 ENCOUNTER — Ambulatory Visit: Payer: Medicare Other

## 2019-02-20 ENCOUNTER — Ambulatory Visit: Payer: Medicare PPO | Attending: Internal Medicine

## 2019-02-20 DIAGNOSIS — Z23 Encounter for immunization: Secondary | ICD-10-CM

## 2019-02-20 NOTE — Progress Notes (Signed)
   Covid-19 Vaccination Clinic  Name:  Tyler Carr    MRN: [id number] DOB: [birth year]  02/20/2019  Mr. Tyler Carr was observed post Covid-19 immunization for 15 minutes without incidence. He was provided with Vaccine Information Sheet and instruction to access the V-Safe system.   Mr. Tyler Carr was instructed to call 911 with any severe reactions post vaccine: Marland Kitchen Difficulty breathing  . Swelling of your face and throat  . A fast heartbeat  . A bad rash all over your body  . Dizziness and weakness    Immunizations Administered    Name Date Dose VIS Date Route   Pfizer COVID-19 Vaccine 02/20/2019 11:55 AM 0.3 mL 12/26/2018 Intramuscular   Manufacturer: [employer], [employer]   Lot: [id number]   NDC: [address]-1

## 2019-03-17 ENCOUNTER — Ambulatory Visit: Payer: Medicare PPO | Attending: Internal Medicine

## 2019-03-17 DIAGNOSIS — Z23 Encounter for immunization: Secondary | ICD-10-CM | POA: Insufficient documentation

## 2019-03-17 NOTE — Progress Notes (Signed)
   Covid-19 Vaccination Clinic  Name:  Tyler Carr    MRN: [id number] DOB: 12/15/1930  03/17/2019  [name] was observed post Covid-19 immunization for 15 minutes without incident. He was provided with Vaccine Information Sheet and instruction to access the V-Safe system.   [name] was instructed to call 911 with any severe reactions post vaccine: Marland Kitchen Difficulty breathing  . Swelling of face and throat  . A fast heartbeat  . A bad rash all over body  . Dizziness and weakness   Immunizations Administered    Name Date Dose VIS Date Route   Pfizer COVID-19 Vaccine 03/17/2019 11:39 AM 0.3 mL 12/26/2018 Intramuscular   Manufacturer: [employer], [employer]   Lot: [id number]   NDC: [address]-9

## 2019-03-19 ENCOUNTER — Ambulatory Visit: Payer: Medicare PPO | Admitting: Cardiology

## 2019-03-19 ENCOUNTER — Other Ambulatory Visit: Payer: Self-pay

## 2019-03-19 ENCOUNTER — Encounter: Payer: Self-pay | Admitting: Cardiology

## 2019-03-19 VITALS — BP 140/72 | HR 65 | Ht 71.0 in | Wt 150.0 lb

## 2019-03-19 DIAGNOSIS — N183 Chronic kidney disease, stage 3 unspecified: Secondary | ICD-10-CM

## 2019-03-19 DIAGNOSIS — Z79899 Other long term (current) drug therapy: Secondary | ICD-10-CM | POA: Diagnosis not present

## 2019-03-19 DIAGNOSIS — E782 Mixed hyperlipidemia: Secondary | ICD-10-CM

## 2019-03-19 DIAGNOSIS — I5022 Chronic systolic (congestive) heart failure: Secondary | ICD-10-CM

## 2019-03-19 DIAGNOSIS — I10 Essential (primary) hypertension: Secondary | ICD-10-CM | POA: Diagnosis not present

## 2019-03-19 DIAGNOSIS — I4819 Other persistent atrial fibrillation: Secondary | ICD-10-CM | POA: Diagnosis not present

## 2019-03-19 LAB — CBC
Hematocrit: 37.1 % — ABNORMAL LOW (ref 37.5–51.0)
Hemoglobin: 12.9 g/dL — ABNORMAL LOW (ref 13.0–17.7)
MCH: 31.5 pg (ref 26.6–33.0)
MCHC: 34.8 g/dL (ref 31.5–35.7)
MCV: 91 fL (ref 79–97)
Platelets: 131 10*3/uL — ABNORMAL LOW (ref 150–450)
RBC: 4.1 x10E6/uL — ABNORMAL LOW (ref 4.14–5.80)
RDW: 12.9 % (ref 11.6–15.4)
WBC: 5.4 10*3/uL (ref 3.4–10.8)

## 2019-03-19 LAB — COMPREHENSIVE METABOLIC PANEL
ALT: 19 IU/L (ref 0–44)
AST: 32 IU/L (ref 0–40)
Albumin/Globulin Ratio: 1.7 (ref 1.2–2.2)
Albumin: 4 g/dL (ref 3.6–4.6)
Alkaline Phosphatase: 71 IU/L (ref 39–117)
BUN/Creatinine Ratio: 15 (ref 10–24)
BUN: 16 mg/dL (ref 8–27)
Bilirubin Total: 0.6 mg/dL (ref 0.0–1.2)
CO2: 25 mmol/L (ref 20–29)
Calcium: 9 mg/dL (ref 8.6–10.2)
Chloride: 104 mmol/L (ref 96–106)
Creatinine, Ser: 1.09 mg/dL (ref 0.76–1.27)
GFR calc Af Amer: 71 mL/min/{1.73_m2} (ref >59)
GFR calc non Af Amer: 61 mL/min/{1.73_m2} (ref >59)
Globulin, Total: 2.3 g/dL (ref 1.5–4.5)
Glucose: 97 mg/dL (ref 65–99)
Potassium: 3.5 mmol/L (ref 3.5–5.2)
Sodium: 142 mmol/L (ref 134–144)
Total Protein: 6.3 g/dL (ref 6.0–8.5)

## 2019-03-19 LAB — LIPID PANEL
Chol/HDL Ratio: 2.6 ratio (ref 0.0–5.0)
Cholesterol, Total: 102 mg/dL (ref 100–199)
HDL: 39 mg/dL — ABNORMAL LOW (ref >39)
LDL Chol Calc (NIH): 50 mg/dL (ref 0–99)
Triglycerides: 55 mg/dL (ref 0–149)
VLDL Cholesterol Cal: 13 mg/dL (ref 5–40)

## 2019-03-19 NOTE — Patient Instructions (Signed)
Medication Instructions:  The current medical regimen is effective;  continue present plan and medications.  *If you need a refill on your cardiac medications before your next appointment, please call your pharmacy*  Lab Work: Please have blood work today  (Lipid, CBC, CMP)  If you have labs (blood work) drawn today and your tests are completely normal, you will receive your results only by: Marland Kitchen MyChart Message (if you have MyChart) OR . A paper copy in the mail If you have any lab test that is abnormal or we need to change your treatment, we will call you to review the results.  Follow-Up: At [facility], you and your health needs are our priority.  As part of our continuing mission to provide you with exceptional heart care, we have created designated Provider Care Teams.  These Care Teams include your primary Cardiologist (physician) and Advanced Practice Providers (APPs -  Physician Assistants and Nurse Practitioners) who all work together to provide you with the care you need, when you need it.  We recommend signing up for the patient portal called "MyChart".  Sign up information is provided on this After Visit Summary.  MyChart is used to connect with patients for Virtual Visits (Telemedicine).  Patients are able to view lab/test results, encounter notes, upcoming appointments, etc.  Non-urgent messages can be sent to your provider as well.   To learn more about what you can do with MyChart, go to [email or web address].    Your next appointment:   6 month(s)  The format for your next appointment:   In Person  Provider:   Nada Boozer, NP and 1 year with Dr Anne Fu   Thank you for choosing [facility]!!

## 2019-03-19 NOTE — Progress Notes (Signed)
Cardiology Office Note:    Date:  03/19/2019   ID:  Tyler Carr, DOB 1930-03-09, MRN [id number]  PCP[facility]  [facility]  Cardiologist:  Donato Schultz, MD  Electrophysiologist:  None   Referring MD: [facility]    History of Present Illness:    Tyler Carr is a 85 y.o. male here for follow-up of permanent atrial fibrillation, left renal artery stent, dementia, tachycardia-bradycardia syndrome with prior PEA arrest in the hospital, 12 minutes 3 rounds of epinephrine spontaneous circulation, EF was 30% at the time down from 50 previously.  No ischemic evaluation.  He is here today with his granddaughter.  No symptoms no syncope no palpitations no fevers chills nausea vomiting chest pain shortness of breath.  Doing well.  He has received both Covid vaccines.  His granddaughter is about to receive her first.  He obtains [facility] at the [facility]   Past Medical History:  Diagnosis Date  . CHF (congestive heart failure) (HCC)   . Dementia (HCC)   . Hyperlipidemia   . Hypertension     Past Surgical History:  Procedure Laterality Date  . ABDOMINAL AORTOGRAM N/A 02/04/2018   Procedure: ABDOMINAL AORTOGRAM;  Surgeon: Nada Libman, MD;  [facility];  Service: Cardiovascular;  Laterality: N/A;  . HERNIA REPAIR    . PERIPHERAL VASCULAR INTERVENTION Left 02/04/2018   Procedure: PERIPHERAL VASCULAR INTERVENTION;  Surgeon: Nada Libman, MD;  [facility]: [facility];  Service: Cardiovascular;  Laterality: Left;  Renal artery    Current Medications: Current Meds  Medication Sig  . amLODipine (NORVASC) 5 MG tablet Take 2.5 mg by mouth daily.  Marland Kitchen apixaban (ELIQUIS) 5 MG TABS tablet Take 1 tablet (5 mg total) by mouth 2 (two) times daily.  Marland Kitchen atorvastatin (LIPITOR) 40 MG tablet Take 1 tablet (40 mg total) by mouth daily at 6 PM.  . brimonidine (ALPHAGAN) 0.15 % ophthalmic solution Place 1 drop into both eyes 2 (two) times a day.  .  carvedilol (COREG) 3.125 MG tablet Take 1 tablet (3.125 mg total) by mouth 2 (two) times daily with a meal.  . donepezil (ARICEPT) 5 MG tablet Take 5 mg by mouth at bedtime. Take 2 tabs  . dorzolamide-timolol (COSOPT) 22.3-6.8 MG/ML ophthalmic solution Place 1 drop into both eyes 2 (two) times daily.  Marland Kitchen ENTRESTO 24-26 MG TAKE 1 TABLET BY MOUTH TWICE DAILY  . furosemide (LASIX) 40 MG tablet Take 1 tablet (40 mg total) by mouth 2 (two) times daily.  . memantine (NAMENDA) 10 MG tablet Take 5 mg by mouth See admin instructions. Titration: Take 5 mg by mouth at bedtime for 2 weeks, then 10 mg at bedtime for 2 weeks, then 15 mg at bedtime for 2 weeks, then 20 mg at bedtime (thereafter)  . Multiple Vitamin (MULTIVITAMIN WITH MINERALS) TABS tablet Take 1 tablet by mouth daily.  Marland Kitchen PRESCRIPTION MEDICATION 1 application See admin instructions. [employer]- Apply as directed to bilateral legs/waist area every morning for a period of 1 hour  . tamsulosin (FLOMAX) 0.4 MG CAPS capsule Take 1 capsule (0.4 mg total) by mouth daily.     Allergies:   Patient has no known allergies.   Social History   Socioeconomic History  . Marital status: Married    Spouse name: Not on file  . Number of children: Not on file  . Years of education: Not on file  . Highest education level: Not on file  Occupational History  . Not on file  Tobacco Use  . Smoking status: Never Smoker  . Smokeless tobacco: Never Used  Substance and Sexual Activity  . Alcohol use: Not Currently  . Drug use: Not Currently  . Sexual activity: Not Currently  Other Topics Concern  . Not on file  Social History Narrative  . Not on file   Social Determinants of Health   Financial Resource Strain:   . Difficulty of Paying Living Expenses: Not on file  Food Insecurity:   . Worried About Programme researcher, broadcasting/film/video in the Last Year: Not on file  . Ran Out of Food in the Last Year: Not on file  Transportation Needs:   . Lack  of Transportation (Medical): Not on file  . Lack of Transportation (Non-Medical): Not on file  Physical Activity:   . Days of Exercise per Week: Not on file  . Minutes of Exercise per Session: Not on file  Stress:   . Feeling of Stress : Not on file  Social Connections:   . Frequency of Communication with Friends and Family: Not on file  . Frequency of Social Gatherings with Friends and Family: Not on file  . Attends Religious Services: Not on file  . Active Member of Clubs or Organizations: Not on file  . Attends Banker Meetings: Not on file  . Marital Status: Not on file     Family History: The patient's family history includes Stroke in his mother.  ROS:   Please see the history of present illness.     All other systems reviewed and are negative.  EKGs/Labs/Other Studies Reviewed:    The following studies were reviewed today:  Echo 05/11/18 IMPRESSIONS   1. The left ventricle had a visually estimated ejection fraction of of 30%. The cavity size was normal. There is moderate asymmetric left ventricular hypertrophy. 2. Severe hypokinesis of the LV apex with relative sparing of the base. 3. The right ventricle has moderately reduced systolic function. The cavity was normal. There is no increase in right ventricular wall thickness. 4. Severe biatrial enlargement. 5. Mitral valve regurgitation is mild to moderate by color flow Doppler. 6. Tricuspid valve regurgitation is moderate. 7. Aortic valve regurgitation is mild by color flow Doppler. 8. 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 9. The inferior vena cava was dilated in size with <50% respiratory variability. Patient is on mechanical ventilator. 10. Trivial posterior pericardial effusion is present. 11. Moderate pleural effusion.  EKG:  EKG is  ordered today.  The ekg ordered today demonstrates atrial fibrillation 65, nonspecific ST-T wave changes with no other abnormalities, old  anterior MI pattern.  Prior improvement of T wave inversions in lateral leads.  Recent Labs: 05/14/2018: ALT 44; Magnesium 2.1 08/18/2018: B Natriuretic Peptide 1,237.7; BUN 26; Creatinine, Ser 1.32; Hemoglobin 13.1; Platelets 155; Potassium 3.9; Sodium 144  Recent Lipid Panel    Component Value Date/Time   TRIG 37 05/11/2018 0217    Physical Exam:    VS:  BP 140/72   Pulse 65   Ht 5\' 11"  (1.803 m)   Wt 150 lb (68 kg)   SpO2 98%   BMI 20.92 kg/m     Wt Readings from Last 3 Encounters:  03/19/19 150 lb (68 kg)  11/12/18 160 lb 12.8 oz (72.9 kg)  08/04/18 154 lb (69.9 kg)     GEN:  Well nourished, well developed in no acute distress HEENT: Normal NECK: No JVD; No  carotid bruits LYMPHATICS: No lymphadenopathy CARDIAC: Irregularly irregular normal rate, no murmurs, rubs, gallops RESPIRATORY:  Clear to auscultation without rales, wheezing or rhonchi  ABDOMEN: Soft, non-tender, non-distended MUSCULOSKELETAL:  No edema; No deformity  SKIN: Warm and dry NEUROLOGIC:  Alert and oriented x 3 PSYCHIATRIC:  Normal affect   ASSESSMENT:    1. Persistent atrial fibrillation (HCC)   2. Essential hypertension   3. Medication management   4. Mixed hyperlipidemia   5. Chronic systolic CHF (congestive heart failure) (HCC)   6. Stage 3 chronic kidney disease, unspecified whether stage 3a or 3b CKD    PLAN:    In order of problems listed above:  Permanent atrial fibrillation -Previously had discussed pacemaker implantation given his tachycardia-bradycardia syndrome but patient did not wish to proceed.  Still not interested and thankfully does not appear to warrant at this time.  Thankfully, no frank syncopal episodes.  Heart rate 65 currently.  Low-dose carvedilol.  Chronic systolic heart failure -On goal-directed therapy with Entresto as well as carvedilol albeit low-dose.  Excellent.  Well compensated.  EF 30%.  Chronic anticoagulation -On apixaban 5 mg twice a day.  Prior  creatinine 1.3, weight 68 kg we will go ahead and check a complete metabolic profile.  Check a CBC.  Chronic kidney disease stage III -Creatinine 1.32 previously.  Avoid NSAIDs.  Hyperlipidemia -On atorvastatin, I will check a lipid panel.  Medication Adjustments/Labs and Tests Ordered: Current medicines are reviewed at length with the patient today.  Concerns regarding medicines are outlined above.  Orders Placed This Encounter  Procedures  . CBC  . Comprehensive metabolic panel  . Lipid panel  . EKG 12-Lead   No orders of the defined types were placed in this encounter.   Patient Instructions  Medication Instructions:  The current medical regimen is effective;  continue present plan and medications.  *If you need a refill on your cardiac medications before your next appointment, please call your pharmacy*  Lab Work: Please have blood work today  (Lipid, CBC, CMP)  If you have labs (blood work) drawn today and your tests are completely normal, you will receive your results only by: Marland Kitchen MyChart Message (if you have MyChart) OR . A paper copy in the mail If you have any lab test that is abnormal or we need to change your treatment, we will call you to review the results.  Follow-Up: At [facility], you and your health needs are our priority.  As part of our continuing mission to provide you with exceptional heart care, we have created designated Provider Care Teams.  These Care Teams include your primary Cardiologist (physician) and Advanced Practice Providers (APPs -  Physician Assistants and Nurse Practitioners) who all work together to provide you with the care you need, when you need it.  We recommend signing up for the patient portal called "MyChart".  Sign up information is provided on this After Visit Summary.  MyChart is used to connect with patients for Virtual Visits (Telemedicine).  Patients are able to view lab/test results, encounter notes, upcoming appointments,  etc.  Non-urgent messages can be sent to your provider as well.   To learn more about what you can do with MyChart, go to [email or web address].    Your next appointment:   6 month(s)  The format for your next appointment:   In Person  Provider:   Nada Boozer, NP and 1 year with Dr Anne Fu   Thank you for choosing [facility]!!  Signed, Candee Furbish, MD  03/19/2019 10:28 AM    [facility]

## 2019-08-25 ENCOUNTER — Other Ambulatory Visit: Payer: Self-pay | Admitting: Cardiology

## 2019-08-25 ENCOUNTER — Other Ambulatory Visit: Payer: Self-pay | Admitting: Physician Assistant

## 2019-09-10 ENCOUNTER — Other Ambulatory Visit: Payer: Self-pay

## 2019-09-10 ENCOUNTER — Encounter: Payer: Self-pay | Admitting: Cardiology

## 2019-09-10 ENCOUNTER — Ambulatory Visit: Payer: Medicare PPO | Admitting: Cardiology

## 2019-09-10 VITALS — BP 144/80 | HR 62 | Ht 71.0 in | Wt 150.8 lb

## 2019-09-10 DIAGNOSIS — I10 Essential (primary) hypertension: Secondary | ICD-10-CM

## 2019-09-10 DIAGNOSIS — E782 Mixed hyperlipidemia: Secondary | ICD-10-CM | POA: Diagnosis not present

## 2019-09-10 DIAGNOSIS — I4819 Other persistent atrial fibrillation: Secondary | ICD-10-CM | POA: Diagnosis not present

## 2019-09-10 DIAGNOSIS — Z79899 Other long term (current) drug therapy: Secondary | ICD-10-CM | POA: Diagnosis not present

## 2019-09-10 DIAGNOSIS — I420 Dilated cardiomyopathy: Secondary | ICD-10-CM

## 2019-09-10 DIAGNOSIS — N183 Chronic kidney disease, stage 3 unspecified: Secondary | ICD-10-CM

## 2019-09-10 MED ORDER — AMLODIPINE BESYLATE 2.5 MG PO TABS: 2.5000 mg | ORAL_TABLET | Freq: Every day | ORAL | 11 refills | Status: DC

## 2019-09-10 MED ORDER — CARVEDILOL 3.125 MG PO TABS: 3.1250 mg | ORAL_TABLET | Freq: Two times a day (BID) | ORAL | 11 refills | Status: DC

## 2019-09-10 MED ORDER — FUROSEMIDE 40 MG PO TABS: ORAL_TABLET | ORAL | 3 refills | Status: DC

## 2019-09-10 NOTE — Progress Notes (Signed)
Cardiology Office Note   Date:  09/10/2019   ID:  Tyler Carr, DOB [birth year], MRN [id number]  PCP:  [facility]  Cardiologist:  Dr. Anne Fu    Chief Complaint  Patient presents with  . Atrial Fibrillation      History of Present Illness: Tyler Carr is a 85 y.o. male who presents for permanent a fib  hx of diastolic heart failure, permanent atrial fibrillation, tachycardia-bradycardia syndrome, left renal artery stent, dementia. He had a PEA arrest in the hospital. After 12 minutes and 3 rounds of epinephrine retrieved spontaneous circulation. Troponin was 28. Echo showed 30% EF down from 50 at the [place]. Did not feel as though he was a candidate for ischemic evaluation.  Seen in ER 08/18/18 with chest pain. HS troponin 22 and 17, no MI BNP 1237 Cr 1.32, hgb 13.1 lipase 33 CXR tr rt pl effusion  Pt given extra lasix. 40 IV , EKG without acute changes.   Last visit 03/19/19 No chest pain , no SOB and he does have lower ext edema he does eat some salt at time, he tries not to though.  His family is with him he did not know what meds he is on, PCP follows lipids.  overall he feels well. He has support stockings but not always able to put on.    Today 09/10/19 + lower ext edema, some SOB especially with exertion and has to get up freq at night for passing his urine.  He does not get much rest.  No chest pain.  Cannot wear support stockings due to rolling down and leaving indentation. He does not eat much salt.   They ask for meds to be pills at correct dose they can no longer cut pills in half.    Past Medical History:  Diagnosis Date  . CHF (congestive heart failure) (HCC)   . Dementia (HCC)   . Hyperlipidemia   . Hypertension     Past Surgical History:  Procedure Laterality Date  . ABDOMINAL AORTOGRAM N/A 02/04/2018   Procedure: ABDOMINAL AORTOGRAM;  Surgeon: Nada Libman, MD;  [facility];  Service: Cardiovascular;  Laterality: N/A;  .  HERNIA REPAIR    . PERIPHERAL VASCULAR INTERVENTION Left 02/04/2018   Procedure: PERIPHERAL VASCULAR INTERVENTION;  Surgeon: Nada Libman, MD;  [facility];  Service: Cardiovascular;  Laterality: Left;  Renal artery     Current Outpatient Medications  Medication Sig Dispense Refill  . amLODipine (NORVASC) 5 MG tablet Take 2.5 mg by mouth daily.    Marland Kitchen apixaban (ELIQUIS) 5 MG TABS tablet Take 1 tablet (5 mg total) by mouth 2 (two) times daily. 180 tablet 3  . atorvastatin (LIPITOR) 40 MG tablet TAKE 1 TABLET BY MOUTH DAILY AT 6 PM 90 tablet 2  . brimonidine (ALPHAGAN) 0.15 % ophthalmic solution Place 1 drop into both eyes 2 (two) times a day.    . carvedilol (COREG) 6.25 MG tablet Take 3.125 mg by mouth 2 (two) times daily with a meal.    . donepezil (ARICEPT) 5 MG tablet Take 5 mg by mouth at bedtime. Take 2 tabs    . dorzolamide-timolol (COSOPT) 22.3-6.8 MG/ML ophthalmic solution Place 1 drop into both eyes 2 (two) times daily.    Marland Kitchen ENTRESTO 24-26 MG TAKE 1 TABLET BY MOUTH TWICE DAILY 180 tablet 2  . furosemide (LASIX) 80 MG tablet Take 1 to 0.5 tablet (80mg  or  40mg )  by mouth daily    .  memantine (NAMENDA) 10 MG tablet Take 5 mg by mouth See admin instructions. Titration: Take 5 mg by mouth at bedtime for 2 weeks, then 10 mg at bedtime for 2 weeks, then 15 mg at bedtime for 2 weeks, then 20 mg at bedtime (thereafter)    . Multiple Vitamin (MULTIVITAMIN WITH MINERALS) TABS tablet Take 1 tablet by mouth daily. 30 tablet 0  . PRESCRIPTION MEDICATION 1 application See admin instructions. [employer]- Apply as directed to bilateral legs/waist area every morning for a period of 1 hour    . tamsulosin (FLOMAX) 0.4 MG CAPS capsule Take 1 capsule (0.4 mg total) by mouth daily. 30 capsule 0   No current facility-administered medications for this visit.    Allergies:   Patient has no known allergies.    Social History:  The patient  reports that he has  never smoked. He has never used smokeless tobacco. He reports previous alcohol use. He reports previous drug use.   Family History:  The patient's family history includes Stroke in his mother.    ROS:  General:no colds or fevers, no weight changes overall wt is decreased Skin:no rashes or ulcers HEENT:no blurred vision, no congestion CV:see HPI PUL:see HPI GI:no diarrhea constipation or melena, no indigestion GU:no hematuria, no dysuria MS:no joint pain, no claudication Neuro:no syncope, no lightheadedness Endo:no diabetes, no thyroid disease  Wt Readings from Last 3 Encounters:  09/10/19 150 lb 12.8 oz (68.4 kg)  03/19/19 150 lb (68 kg)  11/12/18 160 lb 12.8 oz (72.9 kg)     PHYSICAL EXAM: VS:  BP (!) 144/80   Pulse 62   Ht 5\' 11"  (1.803 m)   Wt 150 lb 12.8 oz (68.4 kg)   SpO2 98%   BMI 21.03 kg/m  , BMI Body mass index is 21.03 kg/m. General:Pleasant affect, NAD Skin:Warm and dry, brisk capillary refill HEENT:normocephalic, sclera clear, mucus membranes moist Neck:supple, + JVD, no bruits  Heart:irreg irreg without murmur, gallup, rub or click Lungs:clear without rales, rhonchi, or wheezes , non tender, + BS, do not palpate liver spleen or masses Ext:3+ lower ext edema, to just below knees bil. 2+ pedal pulses, 2+ radial pulses Neuro:alert and oriented X 3, MAE, follows commands, + facial symmetry    EKG:  EKG is ordered today. The ekg ordered today demonstrates atrial fib and no acute changes.   Recent Labs: 03/19/2019: ALT 19; BUN 16; Creatinine, Ser 1.09; Hemoglobin 12.9; Platelets 131; Potassium 3.5; Sodium 142    Lipid Panel    Component Value Date/Time   CHOL 102 03/19/2019 1032   TRIG 55 03/19/2019 1032   HDL 39 (L) 03/19/2019 1032   CHOLHDL 2.6 03/19/2019 1032   LDLCALC 50 03/19/2019 1032       Other studies Reviewed: Additional studies/ records that were reviewed today include: . Echo 05/11/18  IMPRESSIONS    1. The left ventricle  had a visually estimated ejection fraction of of  30%. The cavity size was normal. There is moderate asymmetric left  ventricular hypertrophy.  2. Severe hypokinesis of the LV apex with relative sparing of the base.  3. The right ventricle has moderately reduced systolic function. The  cavity was normal. There is no increase in right ventricular wall  thickness.  4. Severe biatrial enlargement.  5. Mitral valve regurgitation is mild to moderate by color flow Doppler.  6. Tricuspid valve regurgitation is moderate.  7. Aortic valve regurgitation is mild by color flow Doppler.  8. 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  9. The inferior vena cava was dilated in size with <50% respiratory  variability. Patient is on mechanical ventilator.  10. Trivial posterior pericardial effusion is present.  11. Moderate pleural effusion.   FINDINGS  Left Ventricle: The left ventricle has a visually estimated ejection  fraction of of 30%. The cavity size was normal. There is moderate  asymmetric left ventricular hypertrophy. Severe hypokinesis of the LV apex  with relative sparing of the base.     Right Ventricle: The right ventricle has moderately reduced systolic  function. The cavity was normal. There is no increase in right ventricular  wall thickness.   Left Atrium: Left atrial size was severely dilated.   Right Atrium: Right atrial size was severely dilated.   Pericardium: Trivial pericardial effusion is present. The pericardial  effusion is posterior to the left ventricle. There is a moderate pleural  effusion in either the left or right lateral region.   Mitral Valve: Mitral valve regurgitation is mild to moderate by color flow  Doppler.  ASSESSMENT AND PLAN:  1.  Permanent a fib - does not wish a PPM  On eliquis HR is stable today and no lightheadedness or syncope  2. HTN mildly elevated but due to volume overload.  3.  Chronic systolic HF some SOB with  increase volume, increase lasix to 80 mg daily for 2 days then 40 mg daily.  If he needs higher dose would like the extra dose in AM to prevent being up all night in BR.  Follow up in 3-4 weeks.    4.  Dilated CM on BB and entresto - on low dose amlodipine, this may be adding to lower ext edema.  - will check BMP today with increase lasix  5.  ckd -3 check today with increase lasix  6.  HLD  Stable with LDL 50   7. Dementia on meds.     Current medicines are reviewed with the patient today.  The patient Has no concerns regarding medicines.  The following changes have been made:  See above Labs/ tests ordered today include:see above  Disposition:   FU:  see above  Signed, Nada Boozer, NP  09/10/2019 3:04 PM    [facility] [address] [place], [place], [place]  [address]/ [address] [place], [place] Phone: [phone]; Fax: [phone]  [phone]

## 2019-09-10 NOTE — Patient Instructions (Addendum)
Medication Instructions:  Your physician has recommended you make the following change in your medication:  1.  INCREASE Lasix to 80 mg for today and tomorrow.  Starting Saturday, reduce the Lasix to 40 mg taking 1 tablet daily and you may take 1 extra tablet daily as needed for edema  I have sent in a new prescription for the Amlodipine 2.5 mg tablet and the Coreg 3.125 tablet     *If you need a refill on your cardiac medications before your next appointment, please call your pharmacy*   Lab Work: TODAY:  BMET  If you have labs (blood work) drawn today and your tests are completely normal, you will receive your results only by: Marland Kitchen MyChart Message (if you have MyChart) OR . A paper copy in the mail If you have any lab test that is abnormal or we need to change your treatment, we will call you to review the results.   Testing/Procedures: None ordered   Follow-Up: At [facility], you and your health needs are our priority.  As part of our continuing mission to provide you with exceptional heart care, we have created designated Provider Care Teams.  These Care Teams include your primary Cardiologist (physician) and Advanced Practice Providers (APPs -  Physician Assistants and Nurse Practitioners) who all work together to provide you with the care you need, when you need it.  We recommend signing up for the patient portal called "MyChart".  Sign up information is provided on this After Visit Summary.  MyChart is used to connect with patients for Virtual Visits (Telemedicine).  Patients are able to view lab/test results, encounter notes, upcoming appointments, etc.  Non-urgent messages can be sent to your provider as well.   To learn more about what you can do with MyChart, go to [email or web address].    Your next appointment:   3-4 WEEKS   The format for your next appointment:   In Person  Provider:   You may see Donato Schultz, MD or one of the following Advanced Practice  Providers on your designated Care Team:    Norma Fredrickson, NP  Nada Boozer, NP  Georgie Chard, NP    Other Instructions

## 2019-09-11 LAB — BASIC METABOLIC PANEL
BUN/Creatinine Ratio: 20 (ref 10–24)
BUN: 19 mg/dL (ref 8–27)
CO2: 23 mmol/L (ref 20–29)
Calcium: 9.5 mg/dL (ref 8.6–10.2)
Chloride: 104 mmol/L (ref 96–106)
Creatinine, Ser: 0.95 mg/dL (ref 0.76–1.27)
GFR calc Af Amer: 83 mL/min/{1.73_m2} (ref >59)
GFR calc non Af Amer: 72 mL/min/{1.73_m2} (ref >59)
Glucose: 87 mg/dL (ref 65–99)
Potassium: 4.1 mmol/L (ref 3.5–5.2)
Sodium: 144 mmol/L (ref 134–144)

## 2019-10-05 ENCOUNTER — Encounter: Payer: Self-pay | Admitting: Cardiology

## 2019-10-05 ENCOUNTER — Ambulatory Visit (INDEPENDENT_AMBULATORY_CARE_PROVIDER_SITE_OTHER): Payer: Medicare PPO | Admitting: Cardiology

## 2019-10-05 ENCOUNTER — Other Ambulatory Visit: Payer: Self-pay

## 2019-10-05 VITALS — BP 140/58 | HR 57 | Ht 71.0 in | Wt 143.0 lb

## 2019-10-05 DIAGNOSIS — N183 Chronic kidney disease, stage 3 unspecified: Secondary | ICD-10-CM | POA: Diagnosis not present

## 2019-10-05 DIAGNOSIS — I5022 Chronic systolic (congestive) heart failure: Secondary | ICD-10-CM | POA: Diagnosis not present

## 2019-10-05 DIAGNOSIS — I4819 Other persistent atrial fibrillation: Secondary | ICD-10-CM

## 2019-10-05 MED ORDER — SACUBITRIL-VALSARTAN 49-51 MG PO TABS: 1.0000 | ORAL_TABLET | Freq: Two times a day (BID) | ORAL | 3 refills | Status: DC

## 2019-10-05 NOTE — Patient Instructions (Signed)
Medication Instructions:  Start Amlodipine. Please increase Entresto to 49-51 mg twice a day.  Continue all other medications as listed. *If you need a refill on your cardiac medications before your next appointment, please call your pharmacy*  Follow-Up: At [facility], you and your health needs are our priority.  As part of our continuing mission to provide you with exceptional heart care, we have created designated Provider Care Teams.  These Care Teams include your primary Cardiologist (physician) and Advanced Practice Providers (APPs -  Physician Assistants and Nurse Practitioners) who all work together to provide you with the care you need, when you need it.  We recommend signing up for the patient portal called "MyChart".  Sign up information is provided on this After Visit Summary.  MyChart is used to connect with patients for Virtual Visits (Telemedicine).  Patients are able to view lab/test results, encounter notes, upcoming appointments, etc.  Non-urgent messages can be sent to your provider as well.   To learn more about what you can do with MyChart, go to [email or web address].    Your next appointment:   1-2 month(s)  The format for your next appointment:   In Person  Provider:   Nada Boozer, NP   Thank you for choosing [facility]!!

## 2019-10-05 NOTE — Progress Notes (Signed)
Cardiology Office Note:    Date:  10/05/2019   ID:  Tyler Carr, DOB 1930/01/19, MRN [id number]  PCP:  [facility]  [facility] Cardiologist:  Donato Schultz, MD  [facility] Electrophysiologist:  None   Referring MD: [facility]     History of Present Illness:    Tyler Carr is a 86 y.o. male here for follow-up of permanent atrial fibrillation.    Still dealing with lower extremity edema.  Fairly chronic.  Left greater than right.  Minimally improved from last visit.  Denies any shortness of breath.  No syncope.   Has comorbidities of PVD-left renal artery stent 2020 Dementia Tachycardia-bradycardia syndrome Diastolic heart failure  Prior PEA arrest in the hospital setting, 3 rounds of epi, spontaneous circulation.  Troponin was 28.   Echo 05/11/2018-EF 30%.  At the [facility] hospital was 50 at 1 point. Aortic root 42 mm     Past Medical History:  Diagnosis Date  . CHF (congestive heart failure) (HCC)   . Dementia (HCC)   . Hyperlipidemia   . Hypertension     Past Surgical History:  Procedure Laterality Date  . ABDOMINAL AORTOGRAM N/A 02/04/2018   Procedure: ABDOMINAL AORTOGRAM;  Surgeon: Nada Libman, MD;  [facility];  Service: Cardiovascular;  Laterality: N/A;  . HERNIA REPAIR    . PERIPHERAL VASCULAR INTERVENTION Left 02/04/2018   Procedure: PERIPHERAL VASCULAR INTERVENTION;  Surgeon: Nada Libman, MD;  [facility]: [facility];  Service: Cardiovascular;  Laterality: Left;  Renal artery    Current Medications: Current Meds  Medication Sig  . apixaban (ELIQUIS) 5 MG TABS tablet Take 1 tablet (5 mg total) by mouth 2 (two) times daily.  Marland Kitchen atorvastatin (LIPITOR) 40 MG tablet TAKE 1 TABLET BY MOUTH DAILY AT 6 PM  . brimonidine (ALPHAGAN) 0.15 % ophthalmic solution Place 1 drop into both eyes 2 (two) times a day.  . carvedilol (COREG) 3.125 MG tablet Take 1 tablet (3.125 mg total) by mouth 2 (two) times daily  with a meal.  . donepezil (ARICEPT) 5 MG tablet Take 5 mg by mouth at bedtime. Take 2 tabs  . dorzolamide-timolol (COSOPT) 22.3-6.8 MG/ML ophthalmic solution Place 1 drop into both eyes 2 (two) times daily.  . furosemide (LASIX) 40 MG tablet Take 40 mg by mouth daily. Take additional tab if needed for edema  . memantine (NAMENDA) 10 MG tablet Take 5 mg by mouth See admin instructions. Titration: Take 5 mg by mouth at bedtime for 2 weeks, then 10 mg at bedtime for 2 weeks, then 15 mg at bedtime for 2 weeks, then 20 mg at bedtime (thereafter)  . Multiple Vitamin (MULTIVITAMIN WITH MINERALS) TABS tablet Take 1 tablet by mouth daily.  Marland Kitchen PRESCRIPTION MEDICATION 1 application See admin instructions. [employer]- Apply as directed to bilateral legs/waist area every morning for a period of 1 hour  . tamsulosin (FLOMAX) 0.4 MG CAPS capsule Take 1 capsule (0.4 mg total) by mouth daily.  . [DISCONTINUED] amLODipine (NORVASC) 2.5 MG tablet Take 1 tablet (2.5 mg total) by mouth daily.  . [DISCONTINUED] ENTRESTO 24-26 MG TAKE 1 TABLET BY MOUTH TWICE DAILY     Allergies:   Patient has no known allergies.   Social History   Socioeconomic History  . Marital status: Married    Spouse name: Not on file  . Number of children: Not on file  . Years of education: Not on file  . Highest education  level: Not on file  Occupational History  . Not on file  Tobacco Use  . Smoking status: Never Smoker  . Smokeless tobacco: Never Used  Vaping Use  . Vaping Use: Never used  Substance and Sexual Activity  . Alcohol use: Not Currently  . Drug use: Not Currently  . Sexual activity: Not Currently  Other Topics Concern  . Not on file  Social History Narrative  . Not on file   Social Determinants of Health   Financial Resource Strain:   . Difficulty of Paying Living Expenses: Not on file  Food Insecurity:   . Worried About Programme researcher, broadcasting/film/video in the Last Year: Not on file  . Ran Out of  Food in the Last Year: Not on file  Transportation Needs:   . Lack of Transportation (Medical): Not on file  . Lack of Transportation (Non-Medical): Not on file  Physical Activity:   . Days of Exercise per Week: Not on file  . Minutes of Exercise per Session: Not on file  Stress:   . Feeling of Stress : Not on file  Social Connections:   . Frequency of Communication with Friends and Family: Not on file  . Frequency of Social Gatherings with Friends and Family: Not on file  . Attends Religious Services: Not on file  . Active Member of Clubs or Organizations: Not on file  . Attends Banker Meetings: Not on file  . Marital Status: Not on file     Family History: The patient's family history includes Stroke in his mother.  ROS:   Please see the history of present illness.    Denies any syncope bleeding orthopnea PND all other systems reviewed and are negative.  EKGs/Labs/Other Studies Reviewed:    The following studies were reviewed today: Echo EF 30%  Prior EKG reviewed shows atrial fibrillation heart rate in the 50s  Recent Labs: 03/19/2019: ALT 19; Hemoglobin 12.9; Platelets 131 09/10/2019: BUN 19; Creatinine, Ser 0.95; Potassium 4.1; Sodium 144  Recent Lipid Panel    Component Value Date/Time   CHOL 102 03/19/2019 1032   TRIG 55 03/19/2019 1032   HDL 39 (L) 03/19/2019 1032   CHOLHDL 2.6 03/19/2019 1032   LDLCALC 50 03/19/2019 1032    Physical Exam:    VS:  BP (!) 140/58   Pulse (!) 57   Ht 5\' 11"  (1.803 m)   Wt 143 lb (64.9 kg)   SpO2 99%   BMI 19.94 kg/m     Wt Readings from Last 3 Encounters:  10/05/19 143 lb (64.9 kg)  09/10/19 150 lb 12.8 oz (68.4 kg)  03/19/19 150 lb (68 kg)     GEN:  Well nourished, well developed in no acute distress HEENT: Normal NECK: No JVD; No carotid bruits LYMPHATICS: No lymphadenopathy CARDIAC: irreg, no murmurs, rubs, gallops RESPIRATORY:  Clear to auscultation without rales, wheezing or rhonchi  ABDOMEN:  Soft, non-tender, non-distended MUSCULOSKELETAL:  No edema; No deformity  SKIN: Warm and dry NEUROLOGIC:  Alert and oriented x 3 PSYCHIATRIC:  Normal affect   ASSESSMENT:    1. Chronic systolic CHF (congestive heart failure) (HCC)   2. Persistent atrial fibrillation (HCC)   3. Stage 3 chronic kidney disease, unspecified whether stage 3a or 3b CKD    PLAN:    In order of problems listed above:  Permanent atrial fibrillation -No desire for pacemaker in the future.  On low-dose carvedilol 3.125. -Stable heart rate.  Chronic anticoagulation -Continue with Eliquis.  Last hemoglobin 12.9.  Dilated cardiomyopathy -Taking carvedilol low-dose and Entresto low-dose.  Also on low-dose amlodipine. -Since he is still having some lower extremity edema, I will discontinue his amlodipine 2.5 mg and I will increase his Entresto to middle dose 49/51.  His systolic blood pressure today is 140.  He should be able to tolerate this. -Continue with Lasix as prescribed.  CKD 3A -At 0.95.  Repeat basic metabolic profile at next clinic visit  Left renal artery stenosis -Status post stent on 02/04/2018 left renal artery.  Dr. Myra Gianotti.  Hyperlipidemia -On atorvastatin 40 mg, no myalgias LDL 50.  TSH 7.3.  Have him follow-up with Vernona Rieger in 1 to 2 months   Medication Adjustments/Labs and Tests Ordered: Current medicines are reviewed at length with the patient today.  Concerns regarding medicines are outlined above.  No orders of the defined types were placed in this encounter.  Meds ordered this encounter  Medications  . sacubitril-valsartan (ENTRESTO) 49-51 MG    Sig: Take 1 tablet by mouth 2 (two) times daily.    Dispense:  180 tablet    Refill:  3    Patient Instructions  Medication Instructions:  Start Amlodipine. Please increase Entresto to 49-51 mg twice a day.  Continue all other medications as listed. *If you need a refill on your cardiac medications before your next appointment,  please call your pharmacy*  Follow-Up: At [facility], you and your health needs are our priority.  As part of our continuing mission to provide you with exceptional heart care, we have created designated Provider Care Teams.  These Care Teams include your primary Cardiologist (physician) and Advanced Practice Providers (APPs -  Physician Assistants and Nurse Practitioners) who all work together to provide you with the care you need, when you need it.  We recommend signing up for the patient portal called "MyChart".  Sign up information is provided on this After Visit Summary.  MyChart is used to connect with patients for Virtual Visits (Telemedicine).  Patients are able to view lab/test results, encounter notes, upcoming appointments, etc.  Non-urgent messages can be sent to your provider as well.   To learn more about what you can do with MyChart, go to [email or web address].    Your next appointment:   1-2 month(s)  The format for your next appointment:   In Person  Provider:   Nada Boozer, NP   Thank you for choosing [facility]!!        Signed, Donato Schultz, MD  10/05/2019 12:39 PM    [facility]

## 2019-10-06 ENCOUNTER — Other Ambulatory Visit: Payer: Self-pay

## 2019-10-06 MED ORDER — SACUBITRIL-VALSARTAN 49-51 MG PO TABS: 1.0000 | ORAL_TABLET | Freq: Two times a day (BID) | ORAL | 3 refills | Status: DC

## 2019-10-06 MED ORDER — APIXABAN 5 MG PO TABS: 5.0000 mg | ORAL_TABLET | Freq: Two times a day (BID) | ORAL | 2 refills | Status: DC

## 2019-10-06 NOTE — Telephone Encounter (Signed)
Eliquis 5mg  refill request received. Patient is 86 years old, weight-64.9kg, Crea-0.95 on 09/10/2019, Diagnosis-Afib, and last seen by Dr. 09/12/2019 on 09/10/2019. Dose is appropriate based on dosing criteria. Will send in refill to requested pharmacy.

## 2019-10-06 NOTE — Telephone Encounter (Signed)
Pt's medication was sent to pt's pharmacy as requested. Confirmation received. Pt is requesting a refill on Eliquis as well. Please address

## 2019-12-01 ENCOUNTER — Other Ambulatory Visit: Payer: Self-pay | Admitting: *Deleted

## 2019-12-01 DIAGNOSIS — I701 Atherosclerosis of renal artery: Secondary | ICD-10-CM

## 2019-12-07 ENCOUNTER — Other Ambulatory Visit: Payer: Self-pay

## 2019-12-07 ENCOUNTER — Ambulatory Visit: Payer: Medicare PPO | Admitting: Cardiology

## 2019-12-07 ENCOUNTER — Encounter: Payer: Self-pay | Admitting: Cardiology

## 2019-12-07 VITALS — BP 136/40 | HR 80 | Ht 67.0 in | Wt 146.0 lb

## 2019-12-07 DIAGNOSIS — I5022 Chronic systolic (congestive) heart failure: Secondary | ICD-10-CM | POA: Diagnosis not present

## 2019-12-07 DIAGNOSIS — Z79899 Other long term (current) drug therapy: Secondary | ICD-10-CM

## 2019-12-07 LAB — BASIC METABOLIC PANEL
BUN/Creatinine Ratio: 17 (ref 10–24)
BUN: 18 mg/dL (ref 8–27)
CO2: 28 mmol/L (ref 20–29)
Calcium: 9.2 mg/dL (ref 8.6–10.2)
Chloride: 106 mmol/L (ref 96–106)
Creatinine, Ser: 1.08 mg/dL (ref 0.76–1.27)
GFR calc Af Amer: 71 mL/min/{1.73_m2} (ref >59)
GFR calc non Af Amer: 61 mL/min/{1.73_m2} (ref >59)
Glucose: 88 mg/dL (ref 65–99)
Potassium: 3.9 mmol/L (ref 3.5–5.2)
Sodium: 146 mmol/L — ABNORMAL HIGH (ref 134–144)

## 2019-12-07 NOTE — Patient Instructions (Signed)
Medication Instructions:  The current medical regimen is effective;  continue present plan and medications.  *If you need a refill on your cardiac medications before your next appointment, please call your pharmacy*  Lab Work: Please have blood work today (BMP) If you have labs (blood work) drawn today and your tests are completely normal, you will receive your results only by: . MyChart Message (if you have MyChart) OR . A paper copy in the mail If you have any lab test that is abnormal or we need to change your treatment, we will call you to review the results.  Follow-Up: At [facility], you and your health needs are our priority.  As part of our continuing mission to provide you with exceptional heart care, we have created designated Provider Care Teams.  These Care Teams include your primary Cardiologist (physician) and Advanced Practice Providers (APPs -  Physician Assistants and Nurse Practitioners) who all work together to provide you with the care you need, when you need it.  We recommend signing up for the patient portal called "MyChart".  Sign up information is provided on this After Visit Summary.  MyChart is used to connect with patients for Virtual Visits (Telemedicine).  Patients are able to view lab/test results, encounter notes, upcoming appointments, etc.  Non-urgent messages can be sent to your provider as well.   To learn more about what you can do with MyChart, go to [email or web address].    Your next appointment:   4 month(s)  The format for your next appointment:   In Person  Provider:   Mark Skains, MD   Thank you for choosing [facility]!!     

## 2019-12-07 NOTE — Progress Notes (Signed)
Cardiology Office Note:    Date:  12/07/2019   ID:  Tyler Carr, DOB [birth year], MRN [id number]  PCP:  Clinic, [facility]  [facility] Cardiologist:  Donato Schultz, MD  [facility] Electrophysiologist:  None   Referring MD: Clinic, [facility]     History of Present Illness:    Tyler Carr is a 86 y.o. 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  Previously at the [facility] hospital weight was 50%.  His leg swelling is mildly improved.  Tolerating the medium dose Entresto well.  No syncope no bleeding.  As an aside, he did state that he had what sounds like a small left inguinal hernia.  Continuing to monitor.  His daughter is present for assistance with history.  Past Medical History:  Diagnosis Date  . CHF (congestive heart failure) (HCC)   . Dementia (HCC)   . Hyperlipidemia   . Hypertension     Past Surgical History:  Procedure Laterality Date  . ABDOMINAL AORTOGRAM N/A 02/04/2018   Procedure: ABDOMINAL AORTOGRAM;  Surgeon: Nada Libman, MD;  [facility]: [facility] INV[facility] CV LAB;  Service: Cardiovascular;  Laterality: N/A;  . HERNIA REPAIR    . PERIPHERAL VASCULAR INTERVENTION Left 02/04/2018   Procedure: PERIPHERAL VASCULAR INTERVENTION;  Surgeon: Nada Libman, MD;  Location: [facility] INVASIVE CV LAB;  Service: Cardiovascular;  Laterality: Left;  Renal artery    Current Medications: Current Meds  Medication Sig  . apixaban (ELIQUIS) 5 MG TABS tablet Take 1 tablet (5 mg total) by mouth 2 (two) times daily.  Marland Kitchen atorvastatin (LIPITOR) 40 MG tablet TAKE 1 TABLET BY MOUTH DAILY AT 6 PM  . brimonidine (ALPHAGAN) 0.15 % ophthalmic solution Place 1 drop into both eyes 2 (two) times a day.  . carvedilol (COREG) 3.125 MG tablet Take 1 tablet (3.125 mg total) by mouth 2 (two) times daily with a meal.  . donepezil (ARICEPT) 5 MG  tablet Take 5 mg by mouth 2 (two) times daily. Take 2 tabs   . dorzolamide-timolol (COSOPT) 22.3-6.8 MG/ML ophthalmic solution Place 1 drop into both eyes 2 (two) times daily.  . furosemide (LASIX) 40 MG tablet Take 40 mg by mouth daily. Take additional tab if needed for edema  . memantine (NAMENDA) 10 MG tablet Take 10 mg by mouth 2 (two) times daily.   . Multiple Vitamin (MULTIVITAMIN WITH MINERALS) TABS tablet Take 1 tablet by mouth daily.  Marland Kitchen PRESCRIPTION MEDICATION 1 application See admin instructions. [employer]- Apply as directed to bilateral legs/waist area every morning for a period of 1 hour  . sacubitril-valsartan (ENTRESTO) 49-51 MG Take 1 tablet by mouth 2 (two) times daily.  . tamsulosin (FLOMAX) 0.4 MG CAPS capsule Take 1 capsule (0.4 mg total) by mouth daily.     Allergies:   Patient has no known allergies.   Social History   Socioeconomic History  . Marital status: Married    Spouse name: Not on file  . Number of children: Not on file  . Years of education: Not on file  . Highest education level: Not on file  Occupational History  . Not on file  Tobacco Use  . Smoking status: Never Smoker  . Smokeless tobacco: Never Used  Vaping Use  . Vaping Use: Never used  Substance and Sexual Activity  . Alcohol use: Not Currently  . Drug use:  Not Currently  . Sexual activity: Not Currently  Other Topics Concern  . Not on file  Social History Narrative  . Not on file   Social Determinants of Health   Financial Resource Strain:   . Difficulty of Paying Living Expenses: Not on file  Food Insecurity:   . Worried About Programme researcher, broadcasting/film/video in the Last Year: Not on file  . Ran Out of Food in the Last Year: Not on file  Transportation Needs:   . Lack of Transportation (Medical): Not on file  . Lack of Transportation (Non-Medical): Not on file  Physical Activity:   . Days of Exercise per Week: Not on file  . Minutes of Exercise per Session: Not on  file  Stress:   . Feeling of Stress : Not on file  Social Connections:   . Frequency of Communication with Friends and Family: Not on file  . Frequency of Social Gatherings with Friends and Family: Not on file  . Attends Religious Services: Not on file  . Active Member of Clubs or Organizations: Not on file  . Attends Banker Meetings: Not on file  . Marital Status: Not on file     Family History: The patient's family history includes Stroke in his mother.  ROS:   Please see the history of present illness.    No fevers chills nausea vomiting.  All other systems reviewed and are negative.  EKGs/Labs/Other Studies Reviewed:    The following studies were reviewed today:  ECHO 2020:   1. The left ventricle had a visually estimated ejection fraction of of  30%. The cavity size was normal. There is moderate asymmetric left  ventricular hypertrophy.  2. Severe hypokinesis of the LV apex with relative sparing of the base.  3. The right ventricle has moderately reduced systolic function. The  cavity was normal. There is no increase in right ventricular wall  thickness.  4. Severe biatrial enlargement.  5. Mitral valve regurgitation is mild to moderate by color flow Doppler.  6. Tricuspid valve regurgitation is moderate.  7. Aortic valve regurgitation is mild by color flow Doppler.  8. 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  9. The inferior vena cava was dilated in size with <50% respiratory  variability. Patient is on mechanical ventilator.  10. Trivial posterior pericardial effusion is present.  11. Moderate pleural effusion.   EKG: Sinus rhythm, incomplete left bundle branch block  Recent Labs: 03/19/2019: ALT 19; Hemoglobin 12.9; Platelets 131 09/10/2019: BUN 19; Creatinine, Ser 0.95; Potassium 4.1; Sodium 144  Recent Lipid Panel    Component Value Date/Time   CHOL 102 03/19/2019 1032   TRIG 55 03/19/2019 1032   HDL 39 (L) 03/19/2019  1032   CHOLHDL 2.6 03/19/2019 1032   LDLCALC 50 03/19/2019 1032     Risk Assessment/Calculations:       Physical Exam:    VS:  BP (!) 136/40   Pulse 80   Ht 5\' 7"  (1.702 m)   Wt 146 lb (66.2 kg)   SpO2 95%   BMI 22.87 kg/m     Wt Readings from Last 3 Encounters:  12/07/19 146 lb (66.2 kg)  10/05/19 143 lb (64.9 kg)  09/10/19 150 lb 12.8 oz (68.4 kg)     GEN:  Well nourished, well developed in no acute distress HEENT: Normal NECK: No JVD; No carotid bruits LYMPHATICS: No lymphadenopathy CARDIAC: RRR, no murmurs, rubs, gallops RESPIRATORY:  Clear to auscultation without rales,  wheezing or rhonchi  ABDOMEN: Soft, non-tender, non-distended MUSCULOSKELETAL: Chronic 2-3+ lower extremity edema; No deformity  SKIN: Warm and dry NEUROLOGIC:  Alert and oriented x 3 PSYCHIATRIC:  Normal affect   ASSESSMENT:    1. Chronic systolic CHF (congestive heart failure) (HCC)   2. Medication management    PLAN:    In order of problems listed above:  Permanent atrial fibrillation -Has been on low-dose carvedilol 3.125 mg twice a day. -Heart rate has been fairly stable.  Has no desire for pacemaker in the future.  Chronic anticoagulation -Continue with Eliquis.  Hemoglobin remained stable at 12.9 with no bleeding.  Continue current medical management.  Chronic systolic heart failure/dilated cardiomyopathy -On carvedilol low-dose as well as Entresto mid dose 49/51 adjusted on 10/05/2019 visit.  Blood pressure was in the 140s previously.  I discontinued his amlodipine 2.5 previously. -Overall doing well with this regimen.  No changes made.  Chronic kidney disease stage IIIa -We will check a basic metabolic profile.  Left renal artery stenosis -Stenting was performed on 02/04/2018 of the left renal artery by Dr. Myra Gianotti.  Stable.  Hyperlipidemia -Atorvastatin 40 mg no myalgias with LDL of 50.  Excellent.     Shared Decision Making/Informed Consent        Medication  Adjustments/Labs and Tests Ordered: Current medicines are reviewed at length with the patient today.  Concerns regarding medicines are outlined above.  Orders Placed This Encounter  Procedures  . Basic metabolic panel   No orders of the defined types were placed in this encounter.   Patient Instructions  Medication Instructions:  The current medical regimen is effective;  continue present plan and medications.  *If you need a refill on your cardiac medications before your next appointment, please call your pharmacy*  Lab Work: Please have blood work today (BMP)  If you have labs (blood work) drawn today and your tests are completely normal, you will receive your results only by: Marland Kitchen MyChart Message (if you have MyChart) OR . A paper copy in the mail If you have any lab test that is abnormal or we need to change your treatment, we will call you to review the results.  Follow-Up: At [facility], you and your health needs are our priority.  As part of our continuing mission to provide you with exceptional heart care, we have created designated Provider Care Teams.  These Care Teams include your primary Cardiologist (physician) and Advanced Practice Providers (APPs -  Physician Assistants and Nurse Practitioners) who all work together to provide you with the care you need, when you need it.  We recommend signing up for the patient portal called "MyChart".  Sign up information is provided on this After Visit Summary.  MyChart is used to connect with patients for Virtual Visits (Telemedicine).  Patients are able to view lab/test results, encounter notes, upcoming appointments, etc.  Non-urgent messages can be sent to your provider as well.   To learn more about what you can do with MyChart, go to [email or web address].    Your next appointment:   4 month(s)  The format for your next appointment:   In Person  Provider:   Donato Schultz, MD   Thank you for choosing [facility]!!         Signed, Donato Schultz, MD  12/07/2019 10:19 AM    [facility]

## 2019-12-09 ENCOUNTER — Ambulatory Visit: Payer: Medicare PPO | Admitting: Physician Assistant

## 2019-12-09 ENCOUNTER — Ambulatory Visit (HOSPITAL_COMMUNITY)
Admission: RE | Admit: 2019-12-09 | Discharge: 2019-12-09 | Disposition: A | Discharge status: Home / Self Care | Payer: Medicare PPO | Source: Ambulatory Visit | Attending: Surgery | Admitting: Surgery

## 2019-12-09 ENCOUNTER — Other Ambulatory Visit: Payer: Self-pay

## 2019-12-09 VITALS — BP 192/71 | HR 47 | Temp 98.2°F | Resp 20 | Ht 67.0 in

## 2019-12-09 DIAGNOSIS — I701 Atherosclerosis of renal artery: Secondary | ICD-10-CM

## 2019-12-09 NOTE — Progress Notes (Signed)
HISTORY AND PHYSICAL     CC:  follow up. Requesting Provider:  Clinic, [facility]  HPI: This is a 86 y.o. male who is here today for follow up for RAS with malignant hypertension and was on 5 medications.  He had angiography with placement of left renal artery stent on 02/04/2018 by Dr. Cory Roughen.    Pt was last seen 03/10/2018 and at that time, his blood pressure was better controlled.  His biggest complaint at that visit was leg swelling.  Dr. Myra Gianotti discussed with pt that he would need to f/u with his PCP for further cardiac and renal issues as potential for his leg swelling.    Also at that visit, the duplex suggested right RAS, however, it was widely patent on arteriogram and he doubted this was real.  The stent was widely patent as well and the elvated velocities on the left side were likely reflective of the stent.  He was scheduled for a 6 month follow up and pt is here today for that visit.   Pt is being followed by Dr. Anne Fu for chronic systolic heart failure on Entresto and he also has a dilated aortic root 21mm.  His echo in April 2020 revealed an EF of 30%.  He also has permanent afib and is on Eliquis.  He has stage IIIa CKD.  The pt returns today for follow up duplex of his renal arteries.  He states his blood pressure has been pretty good.  His leg swelling is a little better.  He continues to elevate his legs.  He does not have any claudication sx or non healing wounds or rest pain.  He denies any pain after eating, weight loss or fear of foot.    The pt is on a statin for cholesterol management.    The pt is not on an aspirin.    Other AC:  Eliquis The pt is on BB, ARB for hypertension.  The pt does not have diabetes. Tobacco hx:  never  Pt does not have family hx of AAA.  Past Medical History:  Diagnosis Date  . CHF (congestive heart failure) (HCC)   . Dementia (HCC)   . Hyperlipidemia   . Hypertension     Past Surgical History:  Procedure Laterality Date  .  ABDOMINAL AORTOGRAM N/A 02/04/2018   Procedure: ABDOMINAL AORTOGRAM;  Surgeon: Nada Libman, MD;  [facility]: [facility];  Service: Cardiovascular;  Laterality: N/A;  . HERNIA REPAIR    . PERIPHERAL VASCULAR INTERVENTION Left 02/04/2018   Procedure: PERIPHERAL VASCULAR INTERVENTION;  Surgeon: Nada Libman, MD;  [facility]: [facility];  Service: Cardiovascular;  Laterality: Left;  Renal artery    No Known Allergies  Current Outpatient Medications  Medication Sig Dispense Refill  . apixaban (ELIQUIS) 5 MG TABS tablet Take 1 tablet (5 mg total) by mouth 2 (two) times daily. 180 tablet 2  . atorvastatin (LIPITOR) 40 MG tablet TAKE 1 TABLET BY MOUTH DAILY AT 6 PM 90 tablet 2  . brimonidine (ALPHAGAN) 0.15 % ophthalmic solution Place 1 drop into both eyes 2 (two) times a day.    . carvedilol (COREG) 3.125 MG tablet Take 1 tablet (3.125 mg total) by mouth 2 (two) times daily with a meal. 60 tablet 11  . donepezil (ARICEPT) 5 MG tablet Take 5 mg by mouth 2 (two) times daily. Take 2 tabs     . dorzolamide-timolol (COSOPT) 22.3-6.8 MG/ML ophthalmic solution Place 1 drop into both eyes  2 (two) times daily.    . furosemide (LASIX) 40 MG tablet Take 40 mg by mouth daily. Take additional tab if needed for edema    . memantine (NAMENDA) 10 MG tablet Take 10 mg by mouth 2 (two) times daily.     . Multiple Vitamin (MULTIVITAMIN WITH MINERALS) TABS tablet Take 1 tablet by mouth daily. 30 tablet 0  . PRESCRIPTION MEDICATION 1 application See admin instructions. [employer]- Apply as directed to bilateral legs/waist area every morning for a period of 1 hour    . sacubitril-valsartan (ENTRESTO) 49-51 MG Take 1 tablet by mouth 2 (two) times daily. 180 tablet 3  . tamsulosin (FLOMAX) 0.4 MG CAPS capsule Take 1 capsule (0.4 mg total) by mouth daily. 30 capsule 0   No current facility-administered medications for this visit.    Family History  Problem Relation Age of  Onset  . Stroke Mother     Social History   Socioeconomic History  . Marital status: Married    Spouse name: Not on file  . Number of children: Not on file  . Years of education: Not on file  . Highest education level: Not on file  Occupational History  . Not on file  Tobacco Use  . Smoking status: Never Smoker  . Smokeless tobacco: Never Used  Vaping Use  . Vaping Use: Never used  Substance and Sexual Activity  . Alcohol use: Not Currently  . Drug use: Not Currently  . Sexual activity: Not Currently  Other Topics Concern  . Not on file  Social History Narrative  . Not on file   Social Determinants of Health   Financial Resource Strain:   . Difficulty of Paying Living Expenses: Not on file  Food Insecurity:   . Worried About Programme researcher, broadcasting/film/video in the Last Year: Not on file  . Ran Out of Food in the Last Year: Not on file  Transportation Needs:   . Lack of Transportation (Medical): Not on file  . Lack of Transportation (Non-Medical): Not on file  Physical Activity:   . Days of Exercise per Week: Not on file  . Minutes of Exercise per Session: Not on file  Stress:   . Feeling of Stress : Not on file  Social Connections:   . Frequency of Communication with Friends and Family: Not on file  . Frequency of Social Gatherings with Friends and Family: Not on file  . Attends Religious Services: Not on file  . Active Member of Clubs or Organizations: Not on file  . Attends Banker Meetings: Not on file  . Marital Status: Not on file  Intimate Partner Violence:   . Fear of Current or Ex-Partner: Not on file  . Emotionally Abused: Not on file  . Physically Abused: Not on file  . Sexually Abused: Not on file     REVIEW OF SYSTEMS:   [X]  denotes positive finding, [ ]  denotes negative finding Cardiac  Comments:  Chest pain or chest pressure:    Shortness of breath upon exertion:    Short of breath when lying flat:    Irregular heart rhythm:         Vascular    Pain in calf, thigh, or hip brought on by ambulation:    Pain in feet at night that wakes you up from your sleep:     Blood clot in your veins:    Leg swelling:  Pulmonary    Oxygen at home:    Productive cough:     Wheezing:         Neurologic    Sudden weakness in arms or legs:     Sudden numbness in arms or legs:     Sudden onset of difficulty speaking or slurred speech:    Temporary loss of vision in one eye:     Problems with dizziness:         Gastrointestinal    Blood in stool:     Vomited blood:         Genitourinary    Burning when urinating:     Blood in urine:        Psychiatric    Major depression:         Hematologic    Bleeding problems:    Problems with blood clotting too easily:        Skin    Rashes or ulcers:        Constitutional    Fever or chills:      PHYSICAL EXAMINATION:  Today's Vitals   12/09/19 1006  BP: (!) 192/71  Pulse: (!) 47  Resp: 20  Temp: 98.2 F (36.8 C)  TempSrc: Temporal  SpO2: 100%  Height: 5\' 7"  (1.702 m)   Body mass index is 22.87 kg/m.   General:  WDWN in NAD; vital signs documented above Gait: Not observed HENT: WNL, normocephalic Pulmonary: normal non-labored breathing , without wheezing Cardiac: irregular HR, without  Murmur; without carotid bruits Abdomen: soft, NT, no masses; aortic pulse is not palpable Skin: without rashes Vascular Exam/Pulses:  Right Left  Radial 2+ (normal) 2+ (normal)  Femoral Faintly palpable 1+ (weak)  Popliteal Unable to palpate Unable to palpate  DP Faint monophasic doppler signal Faint monophasic doppler signal  PT Monophasic doppler signal Monophasic doppler signal   Extremities: without ischemic changes, without Gangrene , without cellulitis; without open wounds; +BLE mild pitting edema with hemosiderin staining BLE Musculoskeletal: no muscle wasting or atrophy  Neurologic: A&O X 3;  No focal weakness or paresthesias are detected Psychiatric:  The  pt has Normal affect.   Non-Invasive Vascular Imaging:   Renal Artery Arterial duplex on 12/09/2019: +------------------+--------+--------+-------+  Right Renal ArteryPSV cm/sEDV cm/sComment  +------------------+--------+--------+-------+  Origin        93    18        +------------------+--------+--------+-------+  Proximal       93    11        +------------------+--------+--------+-------+  Mid          56    14        +------------------+--------+--------+-------+  Distal        47    9        +------------------+--------+--------+-------+   +-----------------+--------+--------+-------+  Left Renal ArteryPSV cm/sEDV cm/sComment  +-----------------+--------+--------+-------+  Origin        92    26        +-----------------+--------+--------+-------+  Proximal      142    15        +-----------------+--------+--------+-------+  Mid        128    28        +-----------------+--------+--------+-------+  Distal        37    12        +-----------------+--------+--------+-------+   +------------+--------+--------+--+-----------+--------+--------+---+  Right KidneyPSV cm/sEDV cm/sRILeft KidneyPSV cm/sEDV cm/sRI   +------------+--------+--------+--+-----------+--------+--------+---+  Upper Pole  Upper Pole             +------------+--------+--------+--+-----------+--------+--------+---+  Mid              Mid                +------------+--------+--------+--+-----------+--------+--------+---+  Lower Pole          Lower Pole             +------------+--------+--------+--+-----------+--------+--------+---+  Hilar             Hilar                +------------+--------+--------+--+-----------+--------+--------+---+   +------------------+-----+------------------+-----+  Right Kidney      Left Kidney         +------------------+-----+------------------+-----+  RAR           RAR             +------------------+-----+------------------+-----+  RAR (manual)      RAR (manual)        +------------------+-----+------------------+-----+  Cortex         Cortex           +------------------+-----+------------------+-----+  Cortex thickness    Corex thickness       +------------------+-----+------------------+-----+  Kidney length (cm)11.47Kidney length (cm)11.07  +------------------+-----+------------------+-----+  Summary:  Renal:  Right: No evidence of right renal artery stenosis.  Left: Cyst(s) noted. 1-59% stenosis of the left renal artery.   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   Previous arterial duplex on 03/10/2018: Right: Cyst(s) noted. Evidence of a greater than 60% stenosis of theright renal artery. Elevated velocities within the right renal artery indicating a >60% stenosis at the proximal segment; however on 2D and doppler imaging appears widely patent. Left: Cyst(s) noted. Left renal stent not definately visualized. Elevated velocities noted which may be indicative of a 50-99% stenosis; however left renal artery is widley patent.   ASSESSMENT/PLAN:: 86 y.o. male here for follow up for RAS and s/p angiography with placement of left renal artery stent on 02/04/2018 by Dr. Cory Roughen.    Renal artery stenosis: Pt doing well and left RA stent and right RA are patent.  His blood pressure is elevated today, but was normal at cardiology office a couple of days ago.  SMA and Celiac artery stenosis -on duplex today, pt has elevated velocities in the SMA and celiac arteries.  He  does not have any pain with eating, he has not had any weight loss, he does not fear food.  His IMA was not evaluated.  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   -given he is completely asymptomatic, will obtain a mesenteric duplex in one year when he returns for his renal artery duplex.  I discussed with him that if he should start having pain after eating, he should call us back sooner.  He expressed understanding.   Leg Swelling: -pt with CHF and is managed by Dr. Anne Fu.  Pt states he has been elevating his legs often and the swelling has improved.  He has monophasic doppler signals bilateral PT.  He denies any rest pain, wounds or claudication.  Would hold on wearing compression given monophasic signals.  If he becomes symptomatic, can get ABI's.    Atrial fibrillation -on Eliquis   -pt will f/u in one year with PA on Dr. Estanislado Spire clinic day with renal artery duplex and mesenteric duplex. Doreatha Massed, [facility] [phone]  Clinic MD:   Edilia Bo

## 2020-04-21 IMAGING — CT CT HEAD CODE STROKE
3 series · 14 of 47 positions shown, 16 images · non-contrast
Comparison: None.

CLINICAL DATA: Code stroke.  Slurred speech.

EXAM:
CT HEAD WITHOUT CONTRAST
TECHNIQUE: Contiguous axial images were obtained from the base of the skull
through the vertex without intravenous contrast.

[Series 3: head 5.0 st · axial · 0.43mm/px · z∈[+1354,+1484]mm · 8 of 32 slices shown, 10 images]
[im 3/32  brain]
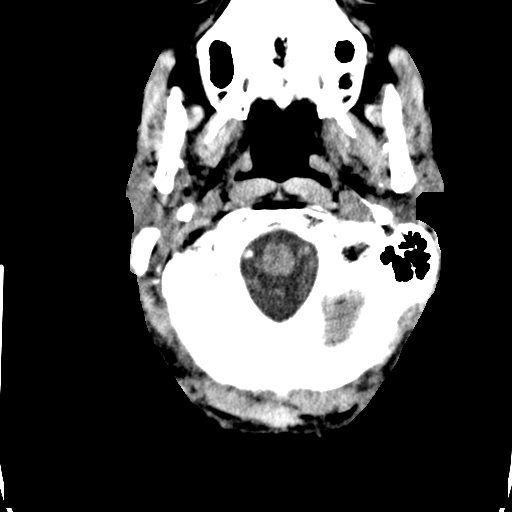
[im 3/32  bone]
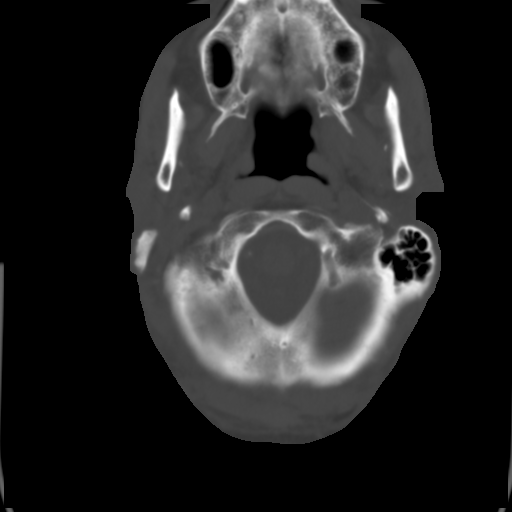
[im 7/32  brain]
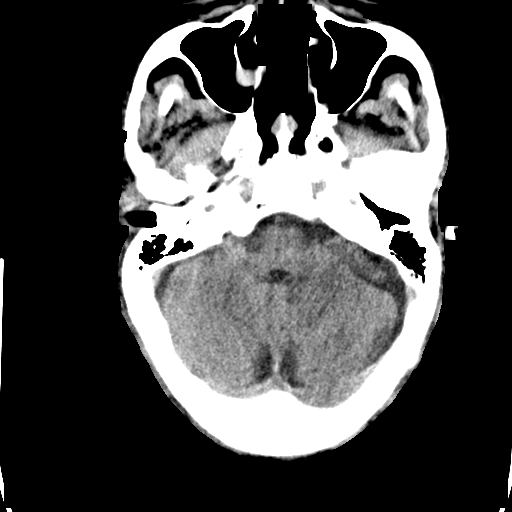
[im 10/32  brain]
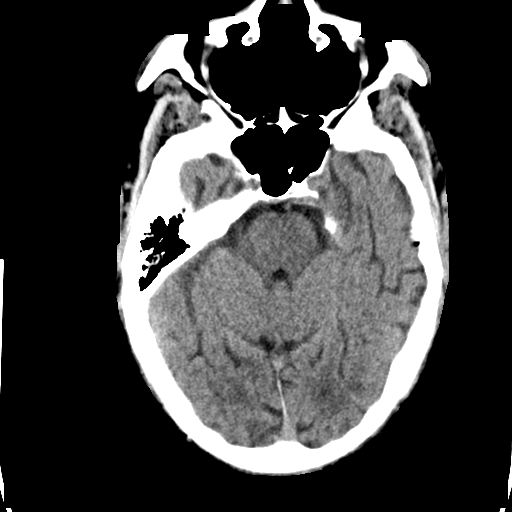
[im 14/32  brain]
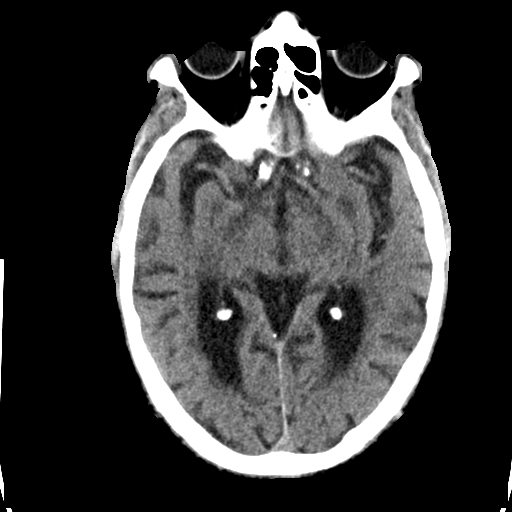
[im 18/32  brain]
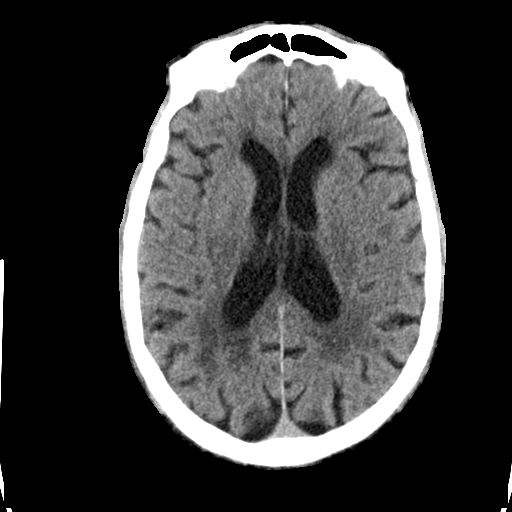
[im 18/32  bone]
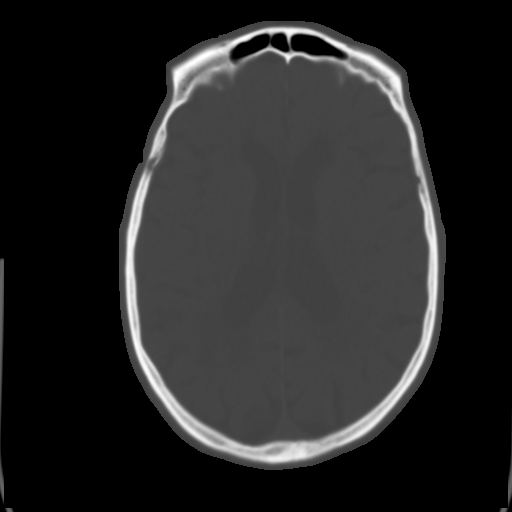
[im 22/32  brain]
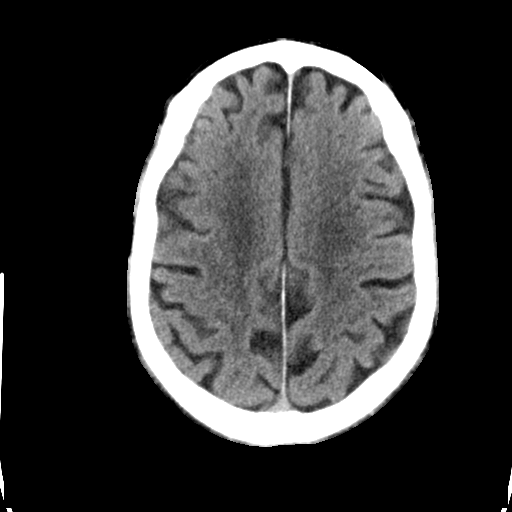
[im 25/32  brain]
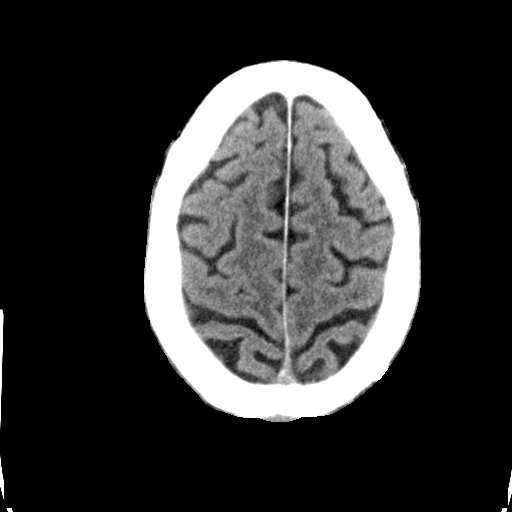
[im 29/32  brain]
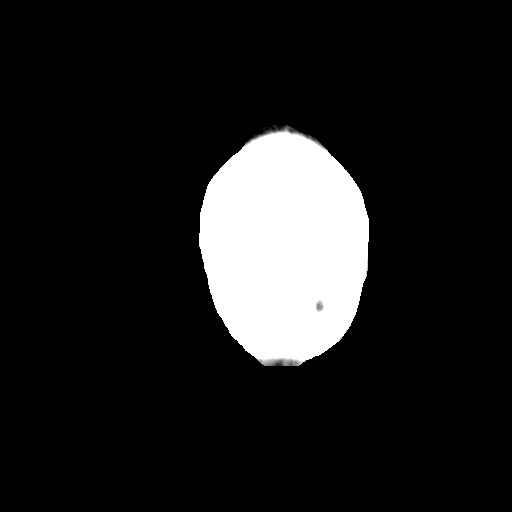

[Series 5: head 3.0 cor st · coronal · 0.31mm/px · 3 of 70 slices shown]
[im 24/70  brain]
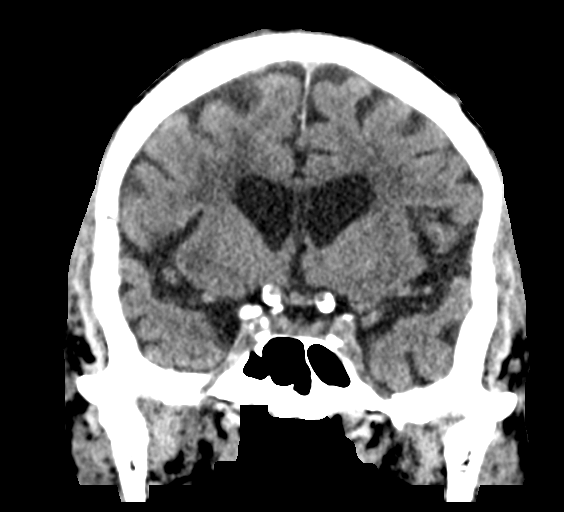
[im 31/70  brain]
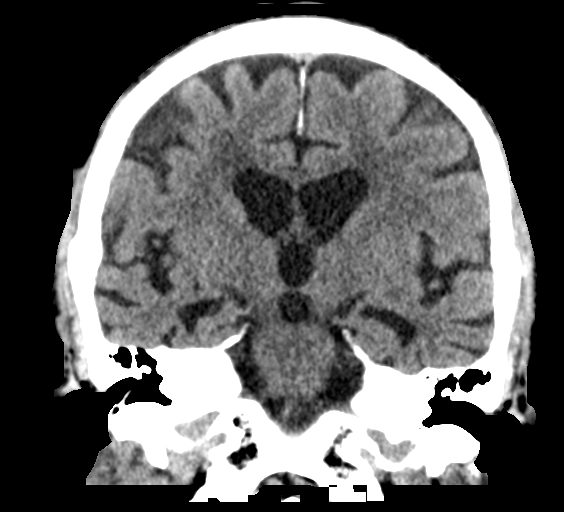
[im 39/70  brain]
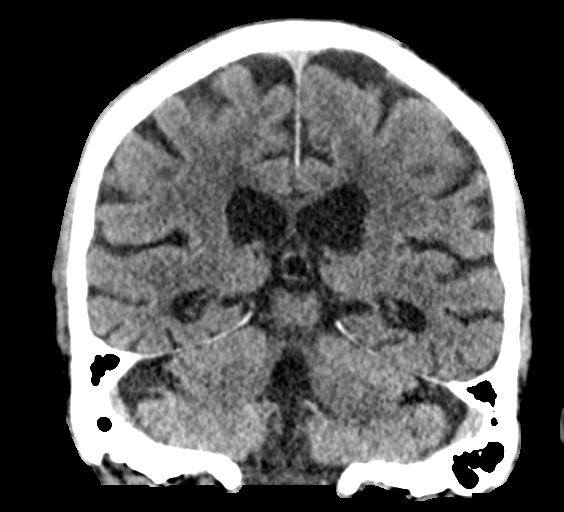

[Series 6: head 3.0 sag st · sagittal · 0.31mm/px · 3 of 67 slices shown]
[im 23/67  brain]
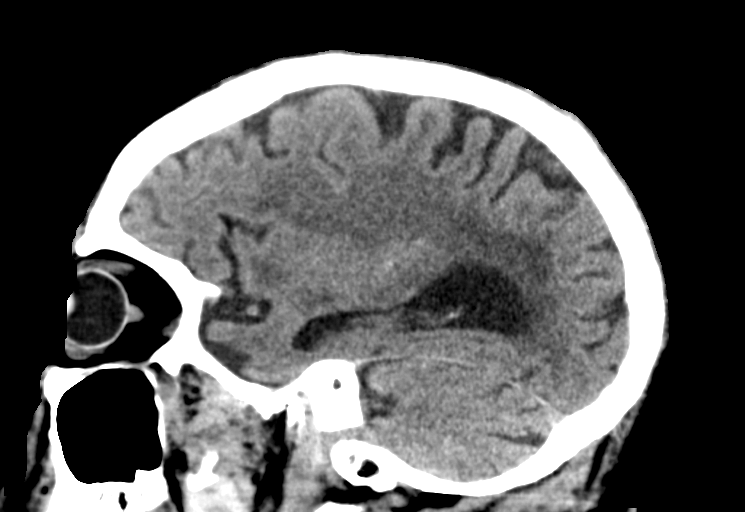
[im 34/67  brain]
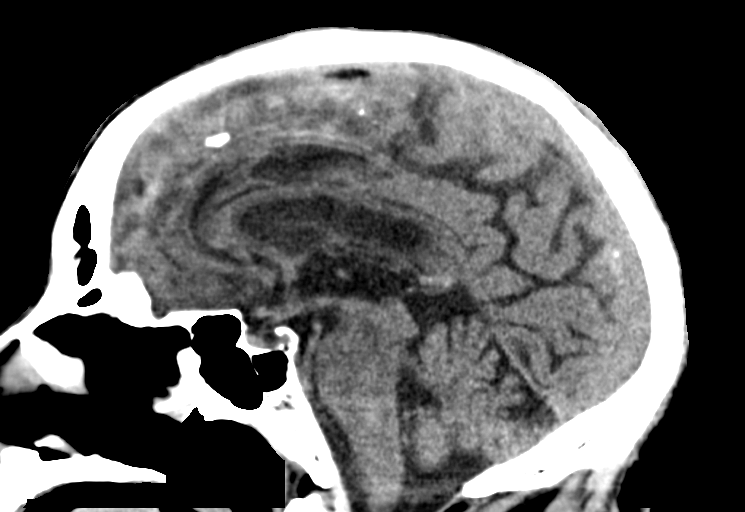
[im 45/67  brain]
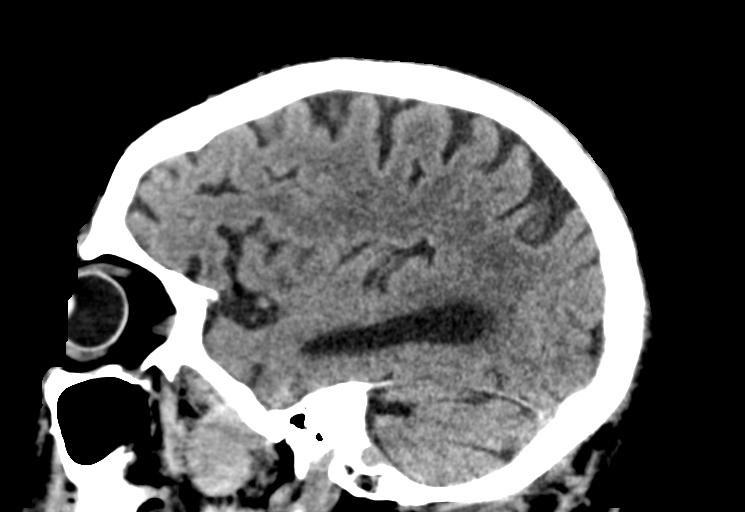

[14 of 47 positions shown; findings below may reference images not displayed]

FINDINGS: Brain: There is no evidence of acute infarct, intracranial
hemorrhage, mass, midline shift, or extra-axial fluid collection.
Generalized cerebral atrophy is relatively mild for age. There is a
small chronic left cerebellar infarct. Patchy hypodensities in the
cerebral white matter bilaterally are nonspecific but compatible
with moderate chronic small vessel ischemic disease.

Vascular: Calcified atherosclerosis at the skull base. No hyperdense
vessel.

Skull: No fracture or focal osseous lesion.

Sinuses/Orbits: Small mucous retention cysts in the maxillary
sinuses. Clear mastoid air cells. Unremarkable orbits.

Other: 1.8 cm right lateral scalp lipoma.

ASPECTS (Alberta Stroke Program Early CT Score)

- Ganglionic level infarction (caudate, lentiform nuclei, internal
capsule, insula, M1-M3 cortex): 7

- Supraganglionic infarction (M4-M6 cortex): 3

Total score (0-10 with 10 being normal): 10
IMPRESSION: 1. No evidence of acute intracranial abnormality.
2. ASPECTS is 10.
3. Moderate chronic small vessel ischemic disease.
4. Chronic left cerebellar infarct.

These results were called by telephone at the time of interpretation
on 01/30/2018 at [DATE] to Dr. Salbador, who verbally acknowledged
these results.

## 2020-07-15 ENCOUNTER — Other Ambulatory Visit: Payer: Self-pay | Admitting: Cardiology

## 2021-02-27 ENCOUNTER — Encounter (HOSPITAL_COMMUNITY): Payer: Self-pay

## 2021-02-27 ENCOUNTER — Emergency Department (HOSPITAL_COMMUNITY): Payer: No Typology Code available for payment source

## 2021-02-27 ENCOUNTER — Inpatient Hospital Stay (HOSPITAL_COMMUNITY)
Admission: EM | Admit: 2021-02-27 | Discharge: 2021-03-06 | DRG: 291 | Disposition: A | Discharge status: Hospice | Payer: No Typology Code available for payment source | Source: Ambulatory Visit | Attending: Internal Medicine | Admitting: Internal Medicine

## 2021-02-27 ENCOUNTER — Other Ambulatory Visit: Payer: Self-pay

## 2021-02-27 DIAGNOSIS — R410 Disorientation, unspecified: Secondary | ICD-10-CM

## 2021-02-27 DIAGNOSIS — Z515 Encounter for palliative care: Secondary | ICD-10-CM | POA: Diagnosis not present

## 2021-02-27 DIAGNOSIS — J9601 Acute respiratory failure with hypoxia: Secondary | ICD-10-CM | POA: Diagnosis not present

## 2021-02-27 DIAGNOSIS — Z20822 Contact with and (suspected) exposure to covid-19: Secondary | ICD-10-CM | POA: Diagnosis present

## 2021-02-27 DIAGNOSIS — E870 Hyperosmolality and hypernatremia: Secondary | ICD-10-CM | POA: Diagnosis present

## 2021-02-27 DIAGNOSIS — I77810 Thoracic aortic ectasia: Secondary | ICD-10-CM | POA: Diagnosis present

## 2021-02-27 DIAGNOSIS — Z66 Do not resuscitate: Secondary | ICD-10-CM | POA: Diagnosis not present

## 2021-02-27 DIAGNOSIS — G9340 Encephalopathy, unspecified: Secondary | ICD-10-CM | POA: Diagnosis not present

## 2021-02-27 DIAGNOSIS — F02B11 Dementia in other diseases classified elsewhere, moderate, with agitation: Secondary | ICD-10-CM | POA: Diagnosis not present

## 2021-02-27 DIAGNOSIS — N1831 Chronic kidney disease, stage 3a: Secondary | ICD-10-CM

## 2021-02-27 DIAGNOSIS — I2729 Other secondary pulmonary hypertension: Secondary | ICD-10-CM | POA: Diagnosis present

## 2021-02-27 DIAGNOSIS — N179 Acute kidney failure, unspecified: Secondary | ICD-10-CM | POA: Diagnosis present

## 2021-02-27 DIAGNOSIS — I509 Heart failure, unspecified: Secondary | ICD-10-CM | POA: Diagnosis present

## 2021-02-27 DIAGNOSIS — F02818 Dementia in other diseases classified elsewhere, unspecified severity, with other behavioral disturbance: Secondary | ICD-10-CM | POA: Diagnosis present

## 2021-02-27 DIAGNOSIS — Z823 Family history of stroke: Secondary | ICD-10-CM

## 2021-02-27 DIAGNOSIS — I130 Hypertensive heart and chronic kidney disease with heart failure and stage 1 through stage 4 chronic kidney disease, or unspecified chronic kidney disease: Secondary | ICD-10-CM | POA: Diagnosis not present

## 2021-02-27 DIAGNOSIS — F03918 Unspecified dementia, unspecified severity, with other behavioral disturbance: Secondary | ICD-10-CM

## 2021-02-27 DIAGNOSIS — E785 Hyperlipidemia, unspecified: Secondary | ICD-10-CM | POA: Diagnosis present

## 2021-02-27 DIAGNOSIS — I5043 Acute on chronic combined systolic (congestive) and diastolic (congestive) heart failure: Secondary | ICD-10-CM | POA: Diagnosis present

## 2021-02-27 DIAGNOSIS — R319 Hematuria, unspecified: Secondary | ICD-10-CM | POA: Diagnosis present

## 2021-02-27 DIAGNOSIS — F02811 Dementia in other diseases classified elsewhere, unspecified severity, with agitation: Secondary | ICD-10-CM | POA: Diagnosis present

## 2021-02-27 DIAGNOSIS — R0603 Acute respiratory distress: Secondary | ICD-10-CM

## 2021-02-27 DIAGNOSIS — J690 Pneumonitis due to inhalation of food and vomit: Secondary | ICD-10-CM | POA: Diagnosis present

## 2021-02-27 DIAGNOSIS — Z7901 Long term (current) use of anticoagulants: Secondary | ICD-10-CM

## 2021-02-27 DIAGNOSIS — R339 Retention of urine, unspecified: Secondary | ICD-10-CM | POA: Diagnosis present

## 2021-02-27 DIAGNOSIS — I082 Rheumatic disorders of both aortic and tricuspid valves: Secondary | ICD-10-CM | POA: Diagnosis present

## 2021-02-27 DIAGNOSIS — N189 Chronic kidney disease, unspecified: Secondary | ICD-10-CM | POA: Diagnosis present

## 2021-02-27 DIAGNOSIS — R918 Other nonspecific abnormal finding of lung field: Secondary | ICD-10-CM | POA: Diagnosis not present

## 2021-02-27 DIAGNOSIS — G309 Alzheimer's disease, unspecified: Secondary | ICD-10-CM | POA: Diagnosis present

## 2021-02-27 DIAGNOSIS — I5021 Acute systolic (congestive) heart failure: Secondary | ICD-10-CM | POA: Diagnosis not present

## 2021-02-27 DIAGNOSIS — I3139 Other pericardial effusion (noninflammatory): Secondary | ICD-10-CM | POA: Diagnosis present

## 2021-02-27 DIAGNOSIS — I2781 Cor pulmonale (chronic): Secondary | ICD-10-CM | POA: Diagnosis present

## 2021-02-27 DIAGNOSIS — Z6820 Body mass index (BMI) 20.0-20.9, adult: Secondary | ICD-10-CM

## 2021-02-27 DIAGNOSIS — I4821 Permanent atrial fibrillation: Secondary | ICD-10-CM | POA: Diagnosis present

## 2021-02-27 DIAGNOSIS — Z7189 Other specified counseling: Secondary | ICD-10-CM | POA: Diagnosis not present

## 2021-02-27 DIAGNOSIS — R634 Abnormal weight loss: Secondary | ICD-10-CM | POA: Diagnosis present

## 2021-02-27 DIAGNOSIS — Z79899 Other long term (current) drug therapy: Secondary | ICD-10-CM

## 2021-02-27 DIAGNOSIS — Z09 Encounter for follow-up examination after completed treatment for conditions other than malignant neoplasm: Secondary | ICD-10-CM

## 2021-02-27 DIAGNOSIS — R338 Other retention of urine: Secondary | ICD-10-CM | POA: Diagnosis present

## 2021-02-27 LAB — BASIC METABOLIC PANEL
Anion gap: 13 (ref 5–15)
BUN: 31 mg/dL — ABNORMAL HIGH (ref 8–23)
CO2: 23 mmol/L (ref 22–32)
Calcium: 9.3 mg/dL (ref 8.9–10.3)
Chloride: 110 mmol/L (ref 98–111)
Creatinine, Ser: 1.27 mg/dL — ABNORMAL HIGH (ref 0.61–1.24)
GFR, Estimated: 54 mL/min — ABNORMAL LOW (ref >60)
Glucose, Bld: 111 mg/dL — ABNORMAL HIGH (ref 70–99)
Potassium: 3.9 mmol/L (ref 3.5–5.1)
Sodium: 146 mmol/L — ABNORMAL HIGH (ref 135–145)

## 2021-02-27 LAB — CBC
HCT: 46.8 % (ref 39.0–52.0)
Hemoglobin: 14.9 g/dL (ref 13.0–17.0)
MCH: 31.7 pg (ref 26.0–34.0)
MCHC: 31.8 g/dL (ref 30.0–36.0)
MCV: 99.6 fL (ref 80.0–100.0)
Platelets: 177 10*3/uL (ref 150–400)
RBC: 4.7 MIL/uL (ref 4.22–5.81)
RDW: 14.4 % (ref 11.5–15.5)
WBC: 7.5 10*3/uL (ref 4.0–10.5)
nRBC: 0 % (ref 0.0–0.2)

## 2021-02-27 LAB — BRAIN NATRIURETIC PEPTIDE: B Natriuretic Peptide: >4500 pg/mL — ABNORMAL HIGH (ref 0.0–100.0)

## 2021-02-27 LAB — MAGNESIUM: Magnesium: 2.4 mg/dL (ref 1.7–2.4)

## 2021-02-27 MED ORDER — FUROSEMIDE 10 MG/ML IJ SOLN
60.0000 mg | Freq: Every day | INTRAMUSCULAR | Status: DC
  Administered 2021-02-28: 60 mg via INTRAVENOUS
  Filled 2021-02-27: qty 6

## 2021-02-27 MED ORDER — DONEPEZIL HCL 5 MG PO TABS
5.0000 mg | ORAL_TABLET | Freq: Two times a day (BID) | ORAL | Status: DC
  Administered 2021-02-28 – 2021-03-04 (×9): 5 mg via ORAL
  Filled 2021-02-27 (×11): qty 1

## 2021-02-27 MED ORDER — HALOPERIDOL LACTATE 5 MG/ML IJ SOLN
2.0000 mg | Freq: Once | INTRAMUSCULAR | Status: DC
  Filled 2021-02-27: qty 1

## 2021-02-27 MED ORDER — BRIMONIDINE TARTRATE 0.15 % OP SOLN
1.0000 [drp] | Freq: Two times a day (BID) | OPHTHALMIC | Status: DC
  Administered 2021-02-28 – 2021-03-06 (×13): 1 [drp] via OPHTHALMIC
  Filled 2021-02-27: qty 5

## 2021-02-27 MED ORDER — SACUBITRIL-VALSARTAN 49-51 MG PO TABS
1.0000 | ORAL_TABLET | Freq: Two times a day (BID) | ORAL | Status: DC
  Administered 2021-02-28 (×2): 1 via ORAL
  Filled 2021-02-27 (×4): qty 1

## 2021-02-27 MED ORDER — MEMANTINE HCL 10 MG PO TABS
10.0000 mg | ORAL_TABLET | Freq: Two times a day (BID) | ORAL | Status: DC
  Administered 2021-02-28 – 2021-03-04 (×9): 10 mg via ORAL
  Filled 2021-02-27 (×11): qty 1

## 2021-02-27 MED ORDER — CARVEDILOL 3.125 MG PO TABS
3.1250 mg | ORAL_TABLET | Freq: Two times a day (BID) | ORAL | Status: DC
  Administered 2021-02-28 – 2021-03-04 (×9): 3.125 mg via ORAL
  Filled 2021-02-27 (×10): qty 1

## 2021-02-27 MED ORDER — HALOPERIDOL LACTATE 5 MG/ML IJ SOLN
2.0000 mg | Freq: Once | INTRAMUSCULAR | Status: AC
  Administered 2021-02-27: 2 mg via INTRAVENOUS
  Filled 2021-02-27: qty 1

## 2021-02-27 MED ORDER — DORZOLAMIDE HCL-TIMOLOL MAL 2-0.5 % OP SOLN
1.0000 [drp] | Freq: Two times a day (BID) | OPHTHALMIC | Status: DC
  Administered 2021-02-28 – 2021-03-05 (×11): 1 [drp] via OPHTHALMIC
  Filled 2021-02-27: qty 10

## 2021-02-27 MED ORDER — ATORVASTATIN CALCIUM 40 MG PO TABS
40.0000 mg | ORAL_TABLET | Freq: Every evening | ORAL | Status: DC
  Administered 2021-02-28 – 2021-03-04 (×5): 40 mg via ORAL
  Filled 2021-02-27 (×6): qty 1

## 2021-02-27 MED ORDER — HALOPERIDOL LACTATE 5 MG/ML IJ SOLN
5.0000 mg | Freq: Once | INTRAMUSCULAR | Status: AC
  Administered 2021-02-27: 5 mg via INTRAMUSCULAR

## 2021-02-27 MED ORDER — LORAZEPAM 2 MG/ML IJ SOLN
0.5000 mg | Freq: Once | INTRAMUSCULAR | Status: DC
  Filled 2021-02-27: qty 1

## 2021-02-27 MED ORDER — TAMSULOSIN HCL 0.4 MG PO CAPS
0.4000 mg | ORAL_CAPSULE | Freq: Every day | ORAL | Status: DC
  Administered 2021-02-28 – 2021-03-04 (×5): 0.4 mg via ORAL
  Filled 2021-02-27 (×6): qty 1

## 2021-02-27 MED ORDER — LORAZEPAM 2 MG/ML IJ SOLN
1.0000 mg | Freq: Once | INTRAMUSCULAR | Status: AC
  Administered 2021-02-27: 1 mg via INTRAMUSCULAR

## 2021-02-27 MED ORDER — APIXABAN 5 MG PO TABS
5.0000 mg | ORAL_TABLET | Freq: Two times a day (BID) | ORAL | Status: DC
  Administered 2021-02-28 (×2): 5 mg via ORAL
  Filled 2021-02-27 (×3): qty 1

## 2021-02-27 MED ORDER — FUROSEMIDE 10 MG/ML IJ SOLN
60.0000 mg | Freq: Once | INTRAMUSCULAR | Status: AC
  Administered 2021-02-27: 60 mg via INTRAVENOUS
  Filled 2021-02-27: qty 6

## 2021-02-27 NOTE — ED Triage Notes (Signed)
Patient sent by [facility] for CHF and pneumonia following chest xray. Patient has had SOB and cough for 1 week. Patient with noted edema to lower extremities. Speaking complete sentences and denies pain

## 2021-02-27 NOTE — ED Notes (Signed)
Pt unable to take meds at this time d/t mental status s/p haldol and ativan. Will attempt to give meds when pt is more alert.

## 2021-02-27 NOTE — H&P (Signed)
History and Physical    Patient: Tyler Carr [id number] DOB: Jul 25, 1930 DOA: 02/27/2021 DOS: the patient was seen and examined on 02/27/2021 PCP: [facility], [facility]  Patient coming from: Home  Chief Complaint: No chief complaint on file.   HPI: Tyler Carr is a 88 y.o. male with medical history significant of dementia, combined CHF, permanent atrial fibrillation s/p Eliquis, CKD stage IIIa and hyperlipidemia who presents with concerns of increasing shortness of breath.  Wife at bedside provides history as patient has dementia and is also drowsy following several doses of medication for agitation.  She reports that for the past week he has had increasing shortness of breath.  He chronically has dependent edema but for the past week it has been persistent.  He is on Lasix 40mg  BID and receives his care mostly at the [place].  They presented to the [facility] clinic today and had chest x-ray showing increased pulmonary edema and was asked to present to the ED.  In the ED, he was afebrile and hypertensive up to 180/90 on room air.  He was very combative despite initially receiving a total of 7 mg of Haldol patient ripped out his IV.  He was then given IM Ativan prior to my evaluation.  CBC unremarkable.  BMP with elevated creatinine of 1.27.  BNP of greater than 4500.  EKG on my review showing right axis deviation and atrial fibrillation and LVH.  Chest x-ray on my review shows bilateral pulmonary edema with left-sided opacity.  Wife denies patient having any fevers or cough at home suggestive of pneumonia.  Review of Systems: unable to review all systems due to the inability of the patient to answer questions. Past Medical History:  Diagnosis Date   CHF (congestive heart failure) (HCC)    Dementia (HCC)    Hyperlipidemia    Hypertension    Past Surgical History:  Procedure Laterality Date   ABDOMINAL AORTOGRAM N/A 02/04/2018   Procedure: ABDOMINAL AORTOGRAM;  Surgeon: Serafina Mitchell, MD;   Location: [place] [facility];  Service: Cardiovascular;  Laterality: N/A;   HERNIA REPAIR     PERIPHERAL VASCULAR INTERVENTION Left 02/04/2018   Procedure: PERIPHERAL VASCULAR INTERVENTION;  Surgeon: Serafina Mitchell, MD;  Location: [place] [facility];  Service: Cardiovascular;  Laterality: Left;  Renal artery   Social History:  reports that he has never smoked. He has never used smokeless tobacco. He reports that he does not currently use alcohol. He reports that he does not currently use drugs.  No Known Allergies  Family History  Problem Relation Age of Onset   Stroke Mother     Prior to Admission medications   Medication Sig Start Date End Date Taking? Authorizing Provider  apixaban (ELIQUIS) 5 MG TABS tablet Take 1 tablet (5 mg total) by mouth 2 (two) times daily. 10/06/19  Yes Jerline Pain, MD  atorvastatin (LIPITOR) 40 MG tablet TAKE 1 TABLET BY MOUTH DAILY AT 6 PM Patient taking differently: Take 40 mg by mouth every evening. 08/26/19  Yes Jerline Pain, MD  brimonidine (ALPHAGAN) 0.15 % ophthalmic solution Place 1 drop into both eyes 2 (two) times a day.   Yes [provider]  carvedilol (COREG) 3.125 MG tablet Take 1 tablet (3.125 mg total) by mouth 2 (two) times daily with a meal. 09/10/19  Yes Isaiah Serge, NP  donepezil (ARICEPT) 5 MG tablet Take 5 mg by mouth 2 (two) times daily. Take 2 tabs    Yes [provider]  dorzolamide-timolol (COSOPT) 22.3-6.8 MG/ML ophthalmic solution Place 1 drop into both eyes 2 (two) times daily.   Yes [provider]  furosemide (LASIX) 40 MG tablet TAKE 2 TABLETS BY MOUTH X 2 DAYS, THEN TAKE 1 TABLET BY MOUTH DAILY AS NEEDED FOR EDEMA Patient taking differently: Take 40 mg by mouth daily. 07/15/20  Yes Jerline Pain, MD  memantine (NAMENDA) 10 MG tablet Take 10 mg by mouth 2 (two) times daily.  04/21/18  Yes [provider]  Multiple Vitamin (MULTIVITAMIN WITH MINERALS) TABS tablet Take 1 tablet by mouth  daily. 05/15/18  Yes Florencia Reasons, MD  sacubitril-valsartan (ENTRESTO) 49-51 MG Take 1 tablet by mouth 2 (two) times daily. 10/06/19  Yes Jerline Pain, MD  tamsulosin (FLOMAX) 0.4 MG CAPS capsule Take 1 capsule (0.4 mg total) by mouth daily. 02/06/18  Yes Mikhail, Velta Addison, DO  PRESCRIPTION MEDICATION 1 application See admin instructions. [employer] Compression Machine- Apply as directed to bilateral legs/waist area every morning for a period of 1 hour    [provider]    Physical Exam: Vitals:   02/27/21 1407 02/27/21 1545 02/27/21 1615 02/27/21 1630  BP: (!) 185/91 (!) 187/101 (!) 185/112 (!) 178/106  Pulse: 64 82 85   Resp: 20 18 16  (!) 23  Temp: 98.6 F (37 C)     SpO2: 91% 99% 92%    Constitutional: Drowsy laying in bed asleep following multiple doses of Haldol and Ativan in the ED Eyes: Patient did not cooperate for exam ENMT: Mucous membranes are moist.   Neck: normal, supple,  Respiratory: clear to auscultation bilaterally, normal respiration, no use of accessory muscle Cardiovascular: Regular rate and rhythm,  +3 pitting edema of the lower extremity up to knee bilaterally.  + pedal pulses.  Abdomen: Unable to assess as patient was not cooperative musculoskeletal: no clubbing / cyanosis. No joint deformity upper and lower extremities.  Skin: no rashes, lesions, ulcers.  Neurologic: Patient was drowsy and sedated from IV antipsychotics in the ED.  He was mostly not cooperative with exam and pushing my stethoscope away during exam.  Psychiatric: Drowsy   Data Reviewed:  EKG on my review showing right axis deviation and atrial fibrillation and LVH.  Chest x-ray on my review shows bilateral pulmonary edema with left-sided opacity.   Assessment and Plan:  Acute on chronic systolic and diastolic CHF exacerbation -Daily IV 60 mg Lasix - Strict intake and output, daily weights -Last echocardiogram on 04/2018 with a EF of 30%, left ventricular hypertrophy, severe  hypokinesis of the LV apex. -Repeat echocardiogram  CKD stage IIIa -No recent baseline for comparison.  Creatinine elevated at 1.27.  Possible AKI.  Will monitor closely while receiving IV diuresis.  Dementia with agitation -Baseline patient is alert and oriented only to self and wife.  Today in the ED was very disoriented and combative.  He was given a total of 7 mg Haldol and 1 mg of IM Ativan by ED physician.  -Continue memantine and donezepil when able to take p.o.  Permanent atrial fibrillation - Continue Eliquis  Hyperlipidemia -Continue atorvastatin     Advance Care Planning:   Code Status: Prior Limited-no intubation  Consults: None  Family Communication: Plan discussed with wife at bedside  Severity of Illness: The appropriate patient status for this patient is INPATIENT. Inpatient status is judged to be reasonable and necessary in order to provide the required intensity of service to ensure the patient's safety. The patient's presenting symptoms,  physical exam findings, and initial radiographic and laboratory data in the context of their chronic comorbidities is felt to place them at high risk for further clinical deterioration. Furthermore, it is not anticipated that the patient will be medically stable for discharge from the hospital within 2 midnights of admission.   * I certify that at the point of admission it is my clinical judgment that the patient will require inpatient hospital care spanning beyond 2 midnights from the point of admission due to high intensity of service, high risk for further deterioration and high frequency of surveillance required.*  Author: Orene Desanctis, DO 02/27/2021 6:49 PM  For on call review [email or web address].

## 2021-02-27 NOTE — ED Notes (Signed)
Patient placed on oxygen at 2l by [place]

## 2021-02-27 NOTE — ED Provider Triage Note (Signed)
Emergency Medicine Provider Triage Evaluation Note  Tyler Carr , a 88 y.o. male  was evaluated in triage.  Pt complains of shortness of breath and peripheral edema.  Patient has history of Alzheimer's.  Wife is at bedside and assists with history.  She states he was evaluated at [facility] and was told he has pneumonia as well as decompensated heart failure.  Reports compliance with home medications.  Not on home supplemental oxygen.  Review of Systems  Positive: As above Negative: As above  Physical Exam  BP (!) 185/91    Pulse 64    Temp 98.6 F (37 C)    Resp 20    SpO2 91%  Gen:   Awake, no distress   Resp:  Normal effort  MSK:   Moves extremities without difficulty  Other:    Medical Decision Making  Medically screening exam initiated at 2:18 PM.  Appropriate orders placed.  Tyler Carr was informed that the remainder of the evaluation will be completed by another provider, this initial triage assessment does not replace that evaluation, and the importance of remaining in the ED until their evaluation is complete.     Tyler Courier, PA-C 02/27/21 1419

## 2021-02-27 NOTE — ED Provider Notes (Signed)
[facility]ENC[facility] Provider Note   CSN: [id number] Arrival date & time: 02/27/21  1357     History  No chief complaint on file.   Tyler Carr is a 88 y.o. male.  Patient is an 88 year old male presenting today due to exertional dyspnea and swelling in his legs.  He has a history of CHF with most recent EF of 30% by echo in 2020, hypertension, hyperlipidemia and dementia.  All of the history is provided by his wife and the [facility] medical records.  His wife reports that for some time he has had some mild exertional dyspnea but over the last 1 week he has had significant shortness of breath where now he is winded just walking to the bathroom and has to sit down and rest.  She also reports for years he has had swelling in his legs but is always fine in the morning and then accumulates as the day progresses however in the last 3 to 4 days he has had persistent swelling in his lower extremities.  He continues to take 40 mg of Lasix in the morning and night but has not increased that dose.  She has noticed a mild dry cough today but he has not had any fever or productive cough.  She has not noticed change in his behavior.  He has had decreased in his oral intake in the last 1 to 2 days but has not had any vomiting or diarrhea.  He went to the [facility] last week because of the shortness of breath and they just were called today about his chest x-ray and told he should come to the hospital due to concern for fluid overload.  The history is provided by the spouse.      Home Medications Prior to Admission medications   Medication Sig Start Date End Date Taking? Authorizing Provider  apixaban (ELIQUIS) 5 MG TABS tablet Take 1 tablet (5 mg total) by mouth 2 (two) times daily. 10/06/19   Jake BatheSkains, Mark C, MD  atorvastatin (LIPITOR) 40 MG tablet TAKE 1 TABLET BY MOUTH DAILY AT 6 PM 08/26/19   Jake BatheSkains, Mark C, MD  brimonidine (ALPHAGAN) 0.15 % ophthalmic solution Place 1 drop into both  eyes 2 (two) times a day.    [provider]  carvedilol (COREG) 3.125 MG tablet Take 1 tablet (3.125 mg total) by mouth 2 (two) times daily with a meal. 09/10/19   Leone BrandIngold, Laura R, NP  donepezil (ARICEPT) 5 MG tablet Take 5 mg by mouth 2 (two) times daily. Take 2 tabs     [provider]  dorzolamide-timolol (COSOPT) 22.3-6.8 MG/ML ophthalmic solution Place 1 drop into both eyes 2 (two) times daily.    [provider]  furosemide (LASIX) 40 MG tablet TAKE 2 TABLETS BY MOUTH X 2 DAYS, THEN TAKE 1 TABLET BY MOUTH DAILY AS NEEDED FOR EDEMA 07/15/20   Jake BatheSkains, Mark C, MD  memantine (NAMENDA) 10 MG tablet Take 10 mg by mouth 2 (two) times daily.  04/21/18   [provider]  Multiple Vitamin (MULTIVITAMIN WITH MINERALS) TABS tablet Take 1 tablet by mouth daily. 05/15/18   Albertine GratesXu, Fang, MD  PRESCRIPTION MEDICATION 1 application See admin instructions. Textile Medical [employer]ression Machine- Apply as directed to bilateral legs/waist area every morning for a period of 1 hour    [provider]  sacubitril-valsartan (ENTRESTO) 49-51 MG Take 1 tablet by mouth 2 (two) times daily. 10/06/19   Jake BatheSkains, Mark C, MD  tamsulosin (  FLOMAX) 0.4 MG CAPS capsule Take 1 capsule (0.4 mg total) by mouth daily. 02/06/18   Edsel Petrin, DO      Allergies    Patient has no known allergies.    Review of Systems   Review of Systems  Physical Exam Updated Vital Signs BP (!) 178/106    Pulse 85    Temp 98.6 F (37 C)    Resp (!) 23    SpO2 92%  Physical Exam Vitals and nursing note reviewed.  Constitutional:      General: He is not in acute distress.    Appearance: He is well-developed.  HENT:     Head: Normocephalic and atraumatic.     Mouth/Throat:     Mouth: Mucous membranes are moist.  Eyes:     Conjunctiva/sclera: Conjunctivae normal.     Pupils: Pupils are equal, round, and reactive to light.  Cardiovascular:     Rate and Rhythm: Normal rate. Rhythm irregularly irregular.      Heart sounds: No murmur heard. Pulmonary:     Effort: Pulmonary effort is normal. No respiratory distress.     Breath sounds: Rales present. No wheezing.  Abdominal:     General: There is no distension.     Palpations: Abdomen is soft.     Tenderness: There is no abdominal tenderness. There is no guarding or rebound.  Musculoskeletal:        General: No tenderness. Normal range of motion.     Cervical back: Normal range of motion and neck supple.     Right lower leg: Edema present.     Left lower leg: Edema present.     Comments: 3+ pitting edema up to the knee bilaterally  Skin:    General: Skin is warm and dry.     Findings: No erythema or rash.  Neurological:     Mental Status: He is alert.     Comments: Oriented to person only.  Able to move all extremities.  Significant dementia  Psychiatric:     Comments: Slightly agitated    ED Results / Procedures / Treatments   Labs (all labs ordered are listed, but only abnormal results are displayed) Labs Reviewed  BASIC METABOLIC PANEL - Abnormal; Notable for the following components:      Result Value   Sodium 146 (*)    Glucose, Bld 111 (*)    BUN 31 (*)    Creatinine, Ser 1.27 (*)    GFR, Estimated 54 (*)    All other components within normal limits  BRAIN NATRIURETIC PEPTIDE - Abnormal; Notable for the following components:   B Natriuretic Peptide >4,500.0 (*)    All other components within normal limits  CBC  MAGNESIUM    EKG EKG Interpretation  Date/Time:  Monday February 27 2021 14:09:25 EST Ventricular Rate:  66 PR Interval:    QRS Duration: 94 QT Interval:  434 QTC Calculation: 454 R Axis:   83 Text Interpretation: Atrial fibrillation Minimal voltage criteria for LVH, may be normal variant ( Cornell product ) Possible Anterior infarct , age undetermined No significant change since last tracing When compared with ECG of 18-Aug-2018 15:50, PREVIOUS ECG IS PRESENT Confirmed by Gwyneth Sprout ([id number]) on  02/27/2021 3:51:31 PM  Radiology DG Chest 2 View  Result Date: 02/27/2021 CLINICAL DATA:  Dyspnea EXAM: CHEST - 2 VIEW COMPARISON:  August 18, 2018 FINDINGS: Enlarged cardiac silhouette with central vascular prominence. Aortic atherosclerosis. Left greater than right basilar predominant interstitial and airspace.  Small right and tiny left pleural effusions. No acute osseous abnormality. IMPRESSION: Findings most consistent with congestive heart failure with pulmonary edema and small right-greater-than-left pleural effusions. Superimposed infection not excluded. Electronically Signed   By: Maudry Mayhew M.D.   On: 02/27/2021 14:56    Procedures Procedures    Medications Ordered in ED Medications  furosemide (LASIX) injection 60 mg (60 mg Intravenous Given 02/27/21 1643)  haloperidol lactate (HALDOL) injection 2 mg (2 mg Intravenous Given 02/27/21 1728)    ED Course/ Medical Decision Making/ A&P                           Medical Decision Making Amount and/or Complexity of Data Reviewed Independent Historian: spouse External Data Reviewed: notes. Labs: ordered. Decision-making details documented in ED Course. Radiology: ordered and independent interpretation performed. Decision-making details documented in ED Course. ECG/medicine tests: ordered and independent interpretation performed. Decision-making details documented in ED Course.  Risk Prescription drug management. Decision regarding hospitalization.   Patient is an 88 year old male with multiple comorbidities presenting today with symptoms most classic for CHF exacerbation.  He has had exertional dyspnea that is worsened as well as worsening swelling in his lower extremities.  His wife does not check his weight regularly but she does report that he has been taking his medications including 40 mg of Lasix twice daily.  He has not appeared uncomfortable at rest but even with small amounts of exertion now he is winded.  She denies him  having any infectious symptoms at this time.  Patient's last echo based on external medical records was in 2020 at which time he had an EF of 30%.  I independently interpreted patient's EKG and chest x-ray today.  Chest x-ray shows significant cardiomegaly as well as interstitial edema and pleural effusion.  EKG shows atrial fibrillation which is unchanged from his prior EKGs.  He does take Eliquis for this.  His rate is controlled at this time.  I independently interpreted patient's labs today and creatinine is 1.27 which appears to be his baseline, mild hyponatremia of 146, CBC without acute findings, magnesium is within normal limits.  Patient remained on continuous cardiac monitoring and had persistent atrial fibrillation but rate remained controlled.  Oxygen and saturation at rest remain greater than 90%.  However given patient's severe dementia he is agitated and becoming delirious being at the hospital and required Haldol as he was pulling out all of his lines.  He was given IV Lasix for diuresis.  BNP is still pending but feel that patient meets criteria for admission given the above findings.  These findings were discussed with his wife who is agreeable to this plan.  5:49 PM BNP is elevated to greater than 4500.  CRITICAL CARE Performed by: Ovide Dusek Total critical care time: 30 minutes Critical care time was exclusive of separately billable procedures and treating other patients. Critical care was necessary to treat or prevent imminent or life-threatening deterioration. Critical care was time spent personally by me on the following activities: development of treatment plan with patient and/or surrogate as well as nursing, discussions with consultants, evaluation of patient's response to treatment, examination of patient, obtaining history from patient or surrogate, ordering and performing treatments and interventions, ordering and review of laboratory studies, ordering and review of  radiographic studies, pulse oximetry and re-evaluation of patient's condition.        Final Clinical Impression(s) / ED Diagnoses Final diagnoses:  Acute on chronic  congestive heart failure, unspecified heart failure type [facility])    Rx / DC Orders ED Discharge Orders     None         Gwyneth Sprout, MD 02/27/21 1749

## 2021-02-27 NOTE — ED Notes (Signed)
Pt ambulating in hall with NT and spouse, refusing to go back to room, stating he needs to use the bathroom but then refusing to go into the bathroom. Pt redirectable, but only for a moment, then becomes agitated and insistent that we are trying to hurt him. Will notify MD.

## 2021-02-28 ENCOUNTER — Inpatient Hospital Stay (HOSPITAL_COMMUNITY): Payer: No Typology Code available for payment source

## 2021-02-28 DIAGNOSIS — I5021 Acute systolic (congestive) heart failure: Secondary | ICD-10-CM | POA: Diagnosis not present

## 2021-02-28 DIAGNOSIS — N179 Acute kidney failure, unspecified: Secondary | ICD-10-CM

## 2021-02-28 DIAGNOSIS — R918 Other nonspecific abnormal finding of lung field: Secondary | ICD-10-CM

## 2021-02-28 DIAGNOSIS — F03918 Unspecified dementia, unspecified severity, with other behavioral disturbance: Secondary | ICD-10-CM | POA: Diagnosis present

## 2021-02-28 DIAGNOSIS — I4821 Permanent atrial fibrillation: Secondary | ICD-10-CM | POA: Diagnosis present

## 2021-02-28 DIAGNOSIS — N189 Chronic kidney disease, unspecified: Secondary | ICD-10-CM | POA: Diagnosis present

## 2021-02-28 LAB — BASIC METABOLIC PANEL
Anion gap: 16 — ABNORMAL HIGH (ref 5–15)
BUN: 30 mg/dL — ABNORMAL HIGH (ref 8–23)
CO2: 21 mmol/L — ABNORMAL LOW (ref 22–32)
Calcium: 8.9 mg/dL (ref 8.9–10.3)
Chloride: 110 mmol/L (ref 98–111)
Creatinine, Ser: 1.31 mg/dL — ABNORMAL HIGH (ref 0.61–1.24)
GFR, Estimated: 52 mL/min — ABNORMAL LOW (ref >60)
Glucose, Bld: 153 mg/dL — ABNORMAL HIGH (ref 70–99)
Potassium: 3.5 mmol/L (ref 3.5–5.1)
Sodium: 147 mmol/L — ABNORMAL HIGH (ref 135–145)

## 2021-02-28 LAB — ECHOCARDIOGRAM COMPLETE
AR max vel: 1.54 cm2
AV Area VTI: 1.38 cm2
AV Area mean vel: 1.23 cm2
AV Mean grad: 4 mmHg
AV Peak grad: 5.5 mmHg
Ao pk vel: 1.17 m/s
Area-P 1/2: 3.99 cm2
Calc EF: 40 %
P 1/2 time: 643 msec
S' Lateral: 3.9 cm
Single Plane A2C EF: 34 %
Single Plane A4C EF: 48.4 %

## 2021-02-28 LAB — RESP PANEL BY RT-PCR (FLU A&B, COVID) ARPGX2
Influenza A by PCR: NEGATIVE
Influenza B by PCR: NEGATIVE
SARS Coronavirus 2 by RT PCR: NEGATIVE

## 2021-02-28 LAB — CBC
HCT: 40.6 % (ref 39.0–52.0)
Hemoglobin: 13.3 g/dL (ref 13.0–17.0)
MCH: 32.4 pg (ref 26.0–34.0)
MCHC: 32.8 g/dL (ref 30.0–36.0)
MCV: 98.8 fL (ref 80.0–100.0)
Platelets: 195 10*3/uL (ref 150–400)
RBC: 4.11 MIL/uL — ABNORMAL LOW (ref 4.22–5.81)
RDW: 14.4 % (ref 11.5–15.5)
WBC: 8.4 10*3/uL (ref 4.0–10.5)
nRBC: 0 % (ref 0.0–0.2)

## 2021-02-28 MED ORDER — FUROSEMIDE 10 MG/ML IJ SOLN
60.0000 mg | Freq: Two times a day (BID) | INTRAMUSCULAR | Status: DC
  Administered 2021-03-01: 60 mg via INTRAVENOUS
  Filled 2021-02-28: qty 6

## 2021-02-28 MED ORDER — SODIUM CHLORIDE 0.9 % IV SOLN
3.0000 g | Freq: Three times a day (TID) | INTRAVENOUS | Status: DC
  Administered 2021-02-28 – 2021-03-01 (×3): 3 g via INTRAVENOUS
  Filled 2021-02-28 (×3): qty 8

## 2021-02-28 MED ORDER — HALOPERIDOL 1 MG PO TABS
1.0000 mg | ORAL_TABLET | Freq: Four times a day (QID) | ORAL | Status: DC | PRN
  Filled 2021-02-28: qty 1

## 2021-02-28 MED ORDER — HALOPERIDOL LACTATE 5 MG/ML IJ SOLN
1.0000 mg | Freq: Four times a day (QID) | INTRAMUSCULAR | Status: DC | PRN
  Administered 2021-03-01 (×2): 1 mg via INTRAMUSCULAR
  Filled 2021-02-28 (×3): qty 1

## 2021-02-28 NOTE — Assessment & Plan Note (Deleted)
Patient with asymmetric pulmonary infiltrates positive on the left suspicious for aspiration. Will place patient on antibiotic therapy with Unasyn and will follow up chest film in am after diuresis. If improvement in infiltrates after diuresis, possible de-escalation of antibiotics. \  Add dysphagia diet and consult speech for swallow evaluation.

## 2021-02-28 NOTE — Assessment & Plan Note (Addendum)
-   Continue carvedilol, hold Eliquis today with hematuria

## 2021-02-28 NOTE — Hospital Course (Addendum)
Tyler Carr was admitted to the hospital with the working diagnosis of acute decompensated heart failure.   88 yo male with the past medical history of dementia, heart failure, atrial fibrillation and dyslipidemia. Patient not able to give detail history due to cognitive impairment. His wife reported patient developing worsening dyspnea and lower extremity edema for 7 days. On the day of hospitalization, he was seen at the [facility] he was found in volume overload and he was referred to the ED for further evaluation. On his initial physical examination his blood pressure was 180/90, HR 64, RR 20, Temp 98,6 and oxygen saturation 91%. Lungs with no wheezing or rales, heart with S1 and S2 present and rhythmic, abdomen soft and  positive lower extremity edema. Patient was combative and agitated.   Na 146, K 3,9, CL 110, bicarb 23. Glucose 111, BUN 31 and cr 1,27  BNP >4,500 Wbc 8,4, hgb 13,3 hct 40 and plt 195   Chest radiograph with bilateral pleural effusions more right than left, the right seems to be loculated, on positive left upper and lower lobe interstitial infiltrates.   EKG 66 bpm with normal axis, normal intervals, sinus rhythm, poor R wave progression,  with ST depression in V4 to V6 with no significant T wave changes, positive LVH   Patient placed on IV furosemide for diuresis Positive delirium requiring antipsychotic therapy

## 2021-02-28 NOTE — Progress Notes (Signed)
SLP Cancellation Note  Patient Details Name: Tyler Carr MRN: [id number] DOB: 09-16-1930   Cancelled treatment: New orders received for swallow evaluation; pt finally sleeping and it was requested politely by granddaughter that we allow him to rest. Shared with her that we would f/u tomorrow for swallow eval.  Estill Bamberg L. Tivis Ringer, [place] Office number [phone] Pager [phone]          Assunta Curtis 02/28/2021, 4:37 PM

## 2021-02-28 NOTE — Assessment & Plan Note (Addendum)
-  Echocardiogram with LV EF 35 to 40% with global hypokinesis, positive LVH, moderate reduction in RV systolic function, RSVP 54, severe tricuspid regurgitation, aortic valve with moderate to severe regurgitation -Diuresed with IV Lasix on admission, unfortunately creatinine trended up and sodium went to 151 -Sodium, kidney function stabilizing, Lasix p.o. resumed -Overall prognosis is poor in the setting of dementia, CHF, cor pulmonale, valvular heart disease, now worsening aspiration pneumonia -Palliative care consulted for goals of care, discussed poor prognosis and ongoing failure to thrive with advanced age, progressive dementia and multiple medical problems, original plan was for home with hospice services, now residential hospice being considered

## 2021-02-28 NOTE — Assessment & Plan Note (Addendum)
-   Concern for progressive dementia, significant agitation this admission especially in the evenings after wife leaves -Continue Aricept and Namenda, as needed Haldol

## 2021-02-28 NOTE — Assessment & Plan Note (Addendum)
CKD stage 3a/ hypernatremia -Creatinine has slowly trended up from 1.2-1.3 and trended up to 1.6 with hyponatremia -Held Lasix and Entresto -Restarted on p.o. Lasix yesterday

## 2021-02-28 NOTE — Progress Notes (Signed)
Heart Failure Navigator Progress Note  Assessed for Heart & Vascular TOC clinic readiness.  Patient does not meet criteria due to baseline dementia.   Navigator available for reassessment of patient.   Ozella Rocks, MSN, RN Heart Failure Nurse Navigator [phone]

## 2021-02-28 NOTE — Progress Notes (Signed)
° °  Echocardiogram 2D Echocardiogram has been performed.  Beryle Beams 02/28/2021, 8:35 AM

## 2021-02-28 NOTE — Assessment & Plan Note (Signed)
Continue with statin therapy.  ?

## 2021-02-28 NOTE — Progress Notes (Signed)
Progress Note   Patient: Tyler Carr [id number] DOB: 05-09-1930 DOA: 02/27/2021     1 DOS: the patient was seen and examined on 02/28/2021   Brief hospital course: Mr. Dillie was admitted to the hospital with the working diagnosis of acute decompensated heart failure.   88 yo male with the past medical history of dementia, heart failure, atrial fibrillation and dyslipidemia. Patient not able to give detail history due to cognitive impairment. His wife reported patient developing worsening dyspnea and lower extremity edema for 7 days. On the day of hospitalization, he was seen at the [facility] he was found in volume overload and he was referred to the ED for further evaluation. On his initial physical examination his blood pressure was 180/90, HR 64, RR 20, Temp 98,6 and oxygen saturation 91%. Lungs with no wheezing or rales, heart with S1 and S2 present and rhythmic, abdomen soft and  positive lower extremity edema. Patient was combative and agitated.   Na 146, K 3,9, CL 110, bicarb 23. Glucose 111, BUN 31 and cr 1,27  BNP >4,500 Wbc 8,4, hgb 13,3 hct 40 and plt 195   Chest radiograph with bilateral pleural effusions more right than left, the right seems to be loculated, on positive left upper and lower lobe interstitial infiltrates.   EKG 66 bpm with normal axis, normal intervals, sinus rhythm, poor R wave progression,  with ST depression in V4 to V6 with no significant T wave changes, positive LVH   Patient placed on IV furosemide for diuresis Positive delirium requiring antipsychotic therapy   Assessment and Plan: * Acute on chronic combined systolic and diastolic CHF (congestive heart failure) (HCC)- (present on admission) Echocardiogram with LV EF 35 to 40% with global hypokinesis, positive LVH, moderate reduction in RV systolic function, RSVP 54, severe dilatation of right and left atriums, small pericardial effusion, severe tricuspid regurgitation, aortic valve with moderate to  severe regurgitation, mild aortic root dilatation.   Patient with acute on chronic core pulmonale. Positive signs of hypervolemia.   Plan to continue diuresis with furosemide, will increase dose to 60 mg IV q12 hrs. Continue with carvedilol and entresto. Patient with a poor prognosis due to valvular heart disease, core pulmonale and dementia.   Permanent atrial fibrillation ([place])- (present on admission) Continue rate control atrial fibrillation with carvedilol. Anticoagulation with apixban. Continue telemetry monitoring.   Dementia with behavioral disturbance- (present on admission) Patient with progressive dementia Positive delirium on admission. At the time of my examination is somnolent.  Continue with memantine and donepezil Continue with as needed haldol for agitation   Acute kidney injury superimposed on chronic kidney disease ([place])- (present on admission) CKD stage 3a/ hypernatremia Worsening renal function with serum cr at 1,31 with K at 3,5 and serum bicarbonate at 21.  Na is 147   Plan to continue diuresis with furosemide and follow up renal function in am.   Hyperlipidemia- (present on admission) Continue with statin therapy.   Pulmonary infiltrates- (present on admission) Patient with asymmetric pulmonary infiltrates positive on the left suspicious for aspiration. Will place patient on antibiotic therapy with Unasyn and will follow up chest film in am after diuresis. If improvement in infiltrates after diuresis, possible de-escalation of antibiotics. \  Add dysphagia diet and consult speech for swallow evaluation.         Subjective: Patient is sedated. All information from his granddaughter at the bedside. Patient with progressive dementia at  home   Physical Exam: Vitals:   02/28/21 1230  02/28/21 1245 02/28/21 1300 02/28/21 1400  BP:    (!) 117/93  Pulse: 98 (!) 105 (!) 103 79  Resp:    20  Temp:    97.9 F (36.6 C)  TempSrc:    Axillary  SpO2:  92% 91% 93% 94%  Weight:    66.6 kg  Height:    5\' 10"  (1.778 m)   Neurology sedated ENT no pallor Cardiovascular with S1 and S2 present positive systolic murmur at the right sternal border, no gallops Positive JVD Positive lower extremity edema +++ pitting bilaterally  Respiratory with bilateral rhonchi  Abdomen soft and non tender  Data Reviewed:    Family Communication: no able to reach his wife over the phone or leave message.,   Disposition: Status is: Inpatient Remains inpatient appropriate because: heart failure management       Planned Discharge Destination: Home     Author: Tawni Millers, MD 02/28/2021 4:08 PM  For on call review [email or web address].

## 2021-02-28 NOTE — Progress Notes (Signed)
I spoke with Mrs. Fomby about her husband's condition, poor prognosis and high risk of worsening despite aggressive medical therapy. She has made decision to change code status to DNR.

## 2021-02-28 NOTE — Progress Notes (Signed)
Pharmacy Antibiotic Note  Tyler Carr is a 88 y.o. male admitted on 02/27/2021. Pharmacy has been consulted for Unasyn dosing for aspiration pneumonia.   Plan: Start Unasyn 3g q8h  Monitor renal function, cultures and clinical progression  Height: 5\' 10"  (177.8 cm) Weight: 66.6 kg (146 lb 13.2 oz) IBW/kg (Calculated) : 73  Temp (24hrs), Avg:98.1 F (36.7 C), Min:97.9 F (36.6 C), Max:98.2 F (36.8 C)  Recent Labs  Lab 02/27/21 1425 02/28/21 0414  WBC 7.5 8.4  CREATININE 1.27* 1.31*    Estimated Creatinine Clearance: 36.7 mL/min (A) (by C-G formula based on SCr of 1.31 mg/dL (H)).    No Known Allergies  Antimicrobials this admission: Unasyn 2/14 >>   Dose adjustments this admission:   Microbiology results:   Thank you for allowing pharmacy to be a part of this patients care.   3/14, PharmD, BCPS Clinical Pharmacist 02/28/2021 4:34 PM

## 2021-03-01 ENCOUNTER — Inpatient Hospital Stay (HOSPITAL_COMMUNITY): Payer: No Typology Code available for payment source

## 2021-03-01 LAB — BASIC METABOLIC PANEL
Anion gap: 15 (ref 5–15)
BUN: 46 mg/dL — ABNORMAL HIGH (ref 8–23)
CO2: 22 mmol/L (ref 22–32)
Calcium: 8.8 mg/dL — ABNORMAL LOW (ref 8.9–10.3)
Chloride: 114 mmol/L — ABNORMAL HIGH (ref 98–111)
Creatinine, Ser: 1.66 mg/dL — ABNORMAL HIGH (ref 0.61–1.24)
GFR, Estimated: 39 mL/min — ABNORMAL LOW (ref >60)
Glucose, Bld: 108 mg/dL — ABNORMAL HIGH (ref 70–99)
Potassium: 4.2 mmol/L (ref 3.5–5.1)
Sodium: 151 mmol/L — ABNORMAL HIGH (ref 135–145)

## 2021-03-01 LAB — CBC
HCT: 38.5 % — ABNORMAL LOW (ref 39.0–52.0)
Hemoglobin: 12.7 g/dL — ABNORMAL LOW (ref 13.0–17.0)
MCH: 32.1 pg (ref 26.0–34.0)
MCHC: 33 g/dL (ref 30.0–36.0)
MCV: 97.2 fL (ref 80.0–100.0)
Platelets: 179 10*3/uL (ref 150–400)
RBC: 3.96 MIL/uL — ABNORMAL LOW (ref 4.22–5.81)
RDW: 14.3 % (ref 11.5–15.5)
WBC: 9.7 10*3/uL (ref 4.0–10.5)
nRBC: 0 % (ref 0.0–0.2)

## 2021-03-01 MED ORDER — SODIUM CHLORIDE 0.9 % IV SOLN
3.0000 g | Freq: Two times a day (BID) | INTRAVENOUS | Status: DC
  Administered 2021-03-01 – 2021-03-02 (×3): 3 g via INTRAVENOUS
  Filled 2021-03-01 (×4): qty 8

## 2021-03-01 MED ORDER — HALOPERIDOL LACTATE 5 MG/ML IJ SOLN
1.0000 mg | Freq: Once | INTRAMUSCULAR | Status: AC
  Administered 2021-03-02: 1 mg via INTRAVENOUS
  Filled 2021-03-01: qty 1

## 2021-03-01 MED ORDER — APIXABAN 2.5 MG PO TABS
2.5000 mg | ORAL_TABLET | Freq: Two times a day (BID) | ORAL | Status: DC
  Administered 2021-03-01 – 2021-03-03 (×6): 2.5 mg via ORAL
  Filled 2021-03-01 (×6): qty 1

## 2021-03-01 NOTE — Progress Notes (Signed)
Pharmacy Antibiotic Note  Tyler Carr is a 88 y.o. male admitted on 02/27/2021. Pharmacy has been consulted for Unasyn dosing for aspiration pneumonia.   Plan: Decrease Unasyn to 3g q12h for worsening renal function.  Monitor renal function, cultures and clinical progression.  Height: 5\' 10"  (177.8 cm) Weight: 66.5 kg (146 lb 9.7 oz) IBW/kg (Calculated) : 73  Temp (24hrs), Avg:98.4 F (36.9 C), Min:97.9 F (36.6 C), Max:98.6 F (37 C)  Recent Labs  Lab 02/27/21 1425 02/28/21 0414 03/01/21 0437  WBC 7.5 8.4 9.7  CREATININE 1.27* 1.31* 1.66*     Estimated Creatinine Clearance: 28.9 mL/min (A) (by C-G formula based on SCr of 1.66 mg/dL (H)).    No Known Allergies  Antimicrobials this admission: Unasyn 2/14 >>   Microbiology results: N/A  Thank you for allowing pharmacy to be a part of this patients care.  3/14, PharmD, BCPS Clinical Pharmacist 03/01/2021 9:44 AM   Please refer to AMION for pharmacy phone number

## 2021-03-01 NOTE — Progress Notes (Addendum)
PROGRESS NOTE    Treyvor Marsala  [id number] DOB: 08-29-1930 DOA: 02/27/2021 PCP: [facility]  Brief Narrative: 88/M with history of dementia, chronic systolic CHF, chronic A-fib on apixaban was brought to the ED by his wife due to worsening dyspnea and edema X 7 days, in the ED creatinine was 1.2, BNP> 4500, chest x-ray noted bilateral pleural effusion right> left,?  Loculation and upper and lower lobe interstitial infiltrates. -Hospitalization complicated by agitation, delirium overnight requiring Haldol and Ativan   Subjective: -Was considerably agitated and confused last night, required Haldol and Ativan  Assessment & Plan:  * Acute on chronic combined systolic and diastolic CHF (congestive heart failure) ([place])- (present on admission) -Echocardiogram with LV EF 35 to 40% with global hypokinesis, positive LVH, moderate reduction in RV systolic function, RSVP 54, severe tricuspid regurgitation, aortic valve with moderate to severe regurgitation -Diuresed with IV Lasix yesterday, unfortunately creatinine is trending up and sodium 151 today -Will hold Lasix and Entresto today -Overall prognosis is poor in the setting of dementia, CHF, cor pulmonale, valvular heart disease  Aspiration pneumonia  -Suspected, pulmonary infiltrates noted on imaging  -Diuretics as above, continue IV Unasyn day 2 -Repeat chest x-ray in 1 to 2 days -SLP evaluation to rule out dysphagia  Acute kidney injury superimposed on chronic kidney disease ([place])- (present on admission) CKD stage 3a/ hypernatremia -Creatinine has slowly trended up from 1.2-1.3 and now 1.6 -Holding Lasix and Entresto today -Monitor urine output, kidney function -Urine output is somewhat poor, check bladder scan to rule out partial retention  Permanent atrial fibrillation ([place])- (present on admission) -Continue carvedilol, apixaban, dose decreased  Dementia with behavioral disturbance- (present on admission) Patient with  progressive dementia -Overnight with considerable agitation -Continue with memantine and donepezil Continue with as needed haldol for agitation   Hyperlipidemia- (present on admission) Continue with statin therapy.    DVT prophylaxis: Apixaban Code Status: DNR Family Communication: Discussed with wife at bedside Disposition Plan: To be determined   Consultants:    Procedures:   Antimicrobials:    Objective: Vitals:   03/01/21 0245 03/01/21 0437 03/01/21 0758 03/01/21 1202  BP:  135/62 (!) 175/99 (!) 167/78  Pulse:  79 79 77  Resp:  20 (!) 21 (!) 23  Temp:  98.5 F (36.9 C) 98.5 F (36.9 C) 98.6 F (37 C)  TempSrc:  Axillary Axillary Axillary  SpO2:  96% 99% 93%  Weight: 66.5 kg     Height:        Intake/Output Summary (Last 24 hours) at 03/01/2021 1327 Last data filed at 03/01/2021 1053 Gross per 24 hour  Intake 240 ml  Output 675 ml  Net -435 ml   Filed Weights   02/28/21 1400 03/01/21 0245  Weight: 66.6 kg 66.5 kg    Examination:  General exam: Elderly chronically ill male sitting up in bed, awake, somewhat alert, answers few questions appropriately, significant cognitive deficits HEENT: Positive JVD CVS: S1-S2, regular rhythm Lungs: Few basilar rales, scattered rhonchi Abdomen: Soft, obese, nontender, bowel sounds present Extremities: 1-2+ edema  Psychiatry: Very poor insight and judgment    Data Reviewed:   CBC: Recent Labs  Lab 02/27/21 1425 02/28/21 0414 03/01/21 0437  WBC 7.5 8.4 9.7  HGB 14.9 13.3 12.7*  HCT 46.8 40.6 38.5*  MCV 99.6 98.8 97.2  PLT 177 195 0000000   Basic Metabolic Panel: Recent Labs  Lab 02/27/21 1425 02/28/21 0414 03/01/21 0437  NA 146* 147* 151*  K 3.9 3.5 4.2  CL 110 110 114*  CO2 23 21* 22  GLUCOSE 111* 153* 108*  BUN 31* 30* 46*  CREATININE 1.27* 1.31* 1.66*  CALCIUM 9.3 8.9 8.8*  MG 2.4  --   --    GFR: Estimated Creatinine Clearance: 28.9 mL/min (A) (by C-G formula based on SCr of 1.66 mg/dL  (H)). Liver Function Tests: No results for input(s): AST, ALT, ALKPHOS, BILITOT, PROT, ALBUMIN in the last 168 hours. No results for input(s): LIPASE, AMYLASE in the last 168 hours. No results for input(s): AMMONIA in the last 168 hours. Coagulation Profile: No results for input(s): INR, PROTIME in the last 168 hours. Cardiac Enzymes: No results for input(s): CKTOTAL, CKMB, CKMBINDEX, TROPONINI in the last 168 hours. BNP (last 3 results) No results for input(s): PROBNP in the last 8760 hours. HbA1C: No results for input(s): HGBA1C in the last 72 hours. CBG: No results for input(s): GLUCAP in the last 168 hours. Lipid Profile: No results for input(s): CHOL, HDL, LDLCALC, TRIG, CHOLHDL, LDLDIRECT in the last 72 hours. Thyroid Function Tests: No results for input(s): TSH, T4TOTAL, FREET4, T3FREE, THYROIDAB in the last 72 hours. Anemia Panel: No results for input(s): VITAMINB12, FOLATE, FERRITIN, TIBC, IRON, RETICCTPCT in the last 72 hours. Urine analysis:    Component Value Date/Time   COLORURINE YELLOW 05/10/2018 1709   APPEARANCEUR CLEAR 05/10/2018 1709   LABSPEC 1.012 05/10/2018 1709   PHURINE 5.0 05/10/2018 1709   GLUCOSEU NEGATIVE 05/10/2018 1709   HGBUR NEGATIVE 05/10/2018 1709   BILIRUBINUR NEGATIVE 05/10/2018 1709   KETONESUR NEGATIVE 05/10/2018 1709   PROTEINUR NEGATIVE 05/10/2018 1709   NITRITE NEGATIVE 05/10/2018 1709   LEUKOCYTESUR NEGATIVE 05/10/2018 1709   Sepsis Labs: @LABRCNTIP (procalcitonin:4,lacticidven:4)  ) Recent Results (from the past 240 hour(s))  Resp Panel by RT-PCR (Flu A&B, Covid) Nasopharyngeal Swab     Status: None   Collection Time: 02/28/21  1:14 PM   Specimen: Nasopharyngeal Swab; Nasopharyngeal(NP) swabs in vial transport medium  Result Value Ref Range Status   SARS Coronavirus 2 by RT PCR NEGATIVE NEGATIVE Final    Comment: (NOTE) SARS-CoV-2 target nucleic acids are NOT DETECTED.  The SARS-CoV-2 RNA is generally detectable in upper  respiratory specimens during the acute phase of infection. The lowest concentration of SARS-CoV-2 viral copies this assay can detect is 138 copies/mL. A negative result does not preclude SARS-Cov-2 infection and should not be used as the sole basis for treatment or other patient management decisions. A negative result may occur with  improper specimen collection/handling, submission of specimen other than nasopharyngeal swab, presence of viral mutation(s) within the areas targeted by this assay, and inadequate number of viral copies(<138 copies/mL). A negative result must be combined with clinical observations, patient history, and epidemiological information. The expected result is Negative.  Fact Sheet for Patients:  EntrepreneurPulse.com.au  Fact Sheet for Healthcare Providers:  IncredibleEmployment.be  This test is no t yet approved or cleared by the [place] FDA and  has been authorized for detection and/or diagnosis of SARS-CoV-2 by FDA under an Emergency Use Authorization (EUA). This EUA will remain  in effect (meaning this test can be used) for the duration of the COVID-19 declaration under Section 564(b)(1) of the Act, 21 U.S.C.section 360bbb-3(b)(1), unless the authorization is terminated  or revoked sooner.       Influenza A by PCR NEGATIVE NEGATIVE Final   Influenza B by PCR NEGATIVE NEGATIVE Final    Comment: (NOTE) The Xpert Xpress SARS-CoV-2/FLU/RSV plus assay is intended as an aid in  the diagnosis of influenza from Nasopharyngeal swab specimens and should not be used as a sole basis for treatment. Nasal washings and aspirates are unacceptable for Xpert Xpress SARS-CoV-2/FLU/RSV testing.  Fact Sheet for Patients: EntrepreneurPulse.com.au  Fact Sheet for Healthcare Providers: IncredibleEmployment.be  This test is not yet approved or cleared by the [place] FDA and has been  authorized for detection and/or diagnosis of SARS-CoV-2 by FDA under an Emergency Use Authorization (EUA). This EUA will remain in effect (meaning this test can be used) for the duration of the COVID-19 declaration under Section 564(b)(1) of the Act, 21 U.S.C. section 360bbb-3(b)(1), unless the authorization is terminated or revoked.  Performed at [facility], [place] [address], [place], [place] [address]      Radiology Studies: DG Chest 2 View  Result Date: 02/27/2021 CLINICAL DATA:  Dyspnea EXAM: CHEST - 2 VIEW COMPARISON:  August 18, 2018 FINDINGS: Enlarged cardiac silhouette with central vascular prominence. Aortic atherosclerosis. Left greater than right basilar predominant interstitial and airspace. Small right and tiny left pleural effusions. No acute osseous abnormality. IMPRESSION: Findings most consistent with congestive heart failure with pulmonary edema and small right-greater-than-left pleural effusions. Superimposed infection not excluded. Electronically Signed   By: Dahlia Bailiff M.D.   On: 02/27/2021 14:56   DG CHEST PORT 1 VIEW  Result Date: 03/01/2021 CLINICAL DATA:  Dyspnea EXAM: PORTABLE CHEST 1 VIEW COMPARISON:  02/27/2021 FINDINGS: Rotated AP portable examination. No significant change in layering bilateral pleural effusions, dense bilateral heterogeneous and interstitial airspace opacity, and gross cardiomegaly. IMPRESSION: No significant change in layering bilateral pleural effusions, dense bilateral heterogeneous and interstitial airspace opacity, and gross cardiomegaly. Findings are consistent with edema, infection, and/or ARDS. Electronically Signed   By: Delanna Ahmadi M.D.   On: 03/01/2021 08:26   ECHOCARDIOGRAM COMPLETE  Result Date: 02/28/2021    ECHOCARDIOGRAM REPORT   Patient Name:   WELDON ERLINGER Date of Exam: 02/28/2021 Medical Rec #:  [id number]     Height:       67.0 in Accession #:    [id number]    Weight:       146.0 lb Date of Birth:  September 13, 1930     BSA:           1.769 m Patient Age:    88 years      BP:           162/98 mmHg Patient Gender: M             HR:           98 bpm. Exam Location:  Inpatient Procedure: 2D Echo, Cardiac Doppler and Color Doppler Indications:    I50.21 CHF  History:        Patient has prior history of Echocardiogram examinations, most                 recent 01/31/2018. CHF; Risk Factors:Hypertension and                 Dyslipidemia.  Sonographer:    Beryle Beams Referring Phys: [id number] Brantley T TU IMPRESSIONS  1. Left ventricular ejection fraction, by estimation, is 35 to 40%. The left ventricle has moderately decreased function. The left ventricle demonstrates global hypokinesis. There is moderate concentric left ventricular hypertrophy. Left ventricular diastolic function could not be evaluated.  2. Right ventricular systolic function is moderately reduced. The right ventricular size is moderately enlarged. There is moderately elevated pulmonary artery systolic pressure. The estimated right ventricular systolic pressure  is 54.2 mmHg.  3. Left atrial size was severely dilated.  4. Right atrial size was severely dilated.  5. A small pericardial effusion is present. The pericardial effusion is circumferential. There is no evidence of cardiac tamponade.  6. The mitral valve is grossly normal. Mild to moderate mitral valve regurgitation. No evidence of mitral stenosis.  7. Tricuspid valve regurgitation is severe.  8. The aortic valve is tricuspid. Aortic valve regurgitation is moderate to severe. No aortic stenosis is present.  9. Aortic dilatation noted. There is mild dilatation of the aortic root, measuring 41 mm. There is mild dilatation of the ascending aorta, measuring 42 mm. 10. The inferior vena cava is dilated in size with <50% respiratory variability, suggesting right atrial pressure of 15 mmHg. Comparison(s): Changes from prior study are noted. EF unchanged ~35%. Moderately reduced RV function. RVSP ~54 mmHG. Moderate to severe AI  now present. BP during study 162/98. FINDINGS  Left Ventricle: Left ventricular ejection fraction, by estimation, is 35 to 40%. The left ventricle has moderately decreased function. The left ventricle demonstrates global hypokinesis. The left ventricular internal cavity size was normal in size. There is moderate concentric left ventricular hypertrophy. Left ventricular diastolic function could not be evaluated due to atrial fibrillation. Left ventricular diastolic function could not be evaluated. The E/e' is 27.8. Right Ventricle: The right ventricular size is moderately enlarged. No increase in right ventricular wall thickness. Right ventricular systolic function is moderately reduced. There is moderately elevated pulmonary artery systolic pressure. The tricuspid  regurgitant velocity is 3.13 m/s, and with an assumed right atrial pressure of 15 mmHg, the estimated right ventricular systolic pressure is 99991111 mmHg. Left Atrium: Left atrial size was severely dilated. Right Atrium: Right atrial size was severely dilated. Pericardium: A small pericardial effusion is present. The pericardial effusion is circumferential. There is no evidence of cardiac tamponade. Mitral Valve: The mitral valve is grossly normal. Mild to moderate mitral valve regurgitation. No evidence of mitral valve stenosis. Tricuspid Valve: The tricuspid valve is grossly normal. Tricuspid valve regurgitation is severe. No evidence of tricuspid stenosis. Aortic Valve: The aortic valve is tricuspid. Aortic valve regurgitation is moderate to severe. Aortic regurgitation PHT measures 643 msec. No aortic stenosis is present. Aortic valve mean gradient measures 4.0 mmHg. Aortic valve peak gradient measures 5.5 mmHg. Aortic valve area, by VTI measures 1.38 cm. Pulmonic Valve: The pulmonic valve was grossly normal. Pulmonic valve regurgitation is trivial. No evidence of pulmonic stenosis. Aorta: Aortic dilatation noted. There is mild dilatation of the aortic  root, measuring 41 mm. There is mild dilatation of the ascending aorta, measuring 42 mm. Venous: The inferior vena cava is dilated in size with less than 50% respiratory variability, suggesting right atrial pressure of 15 mmHg. IAS/Shunts: The atrial septum is grossly normal. Additional Comments: There is a small pleural effusion in the left lateral region.  LEFT VENTRICLE PLAX 2D LVIDd:         5.10 cm      Diastology LVIDs:         3.90 cm      LV e' medial:    4.57 cm/s LV PW:         1.50 cm      LV E/e' medial:  27.8 LV IVS:        1.30 cm      LV e' lateral:   9.68 cm/s LVOT diam:     2.00 cm      LV E/e' lateral: 13.1  LV SV:         28 LV SV Index:   16 LVOT Area:     3.14 cm  LV Volumes (MOD) LV vol d, MOD A2C: 104.0 ml LV vol d, MOD A4C: 114.0 ml LV vol s, MOD A2C: 68.6 ml LV vol s, MOD A4C: 58.8 ml LV SV MOD A2C:     35.4 ml LV SV MOD A4C:     114.0 ml LV SV MOD BP:      45.0 ml RIGHT VENTRICLE             IVC RV S prime:     13.90 cm/s  IVC diam: 2.50 cm RVOT diam:      2.80 cm TAPSE (M-mode): 1.3 cm LEFT ATRIUM              Index        RIGHT ATRIUM           Index LA diam:        5.80 cm  3.28 cm/m   RA Area:     31.70 cm LA Vol (A2C):   158.0 ml 89.31 ml/m  RA Volume:   110.00 ml 62.18 ml/m LA Vol (A4C):   114.0 ml 64.44 ml/m LA Biplane Vol: 142.0 ml 80.27 ml/m  AORTIC VALVE                    PULMONIC VALVE AV Area (Vmax):    1.54 cm     PV Vmax:       0.50 m/s AV Area (Vmean):   1.23 cm     PV Vmean:      26.400 cm/s AV Area (VTI):     1.38 cm     PV VTI:        0.061 m AV Vmax:           117.00 cm/s  PV Peak grad:  1.0 mmHg AV Vmean:          92.400 cm/s  PV Mean grad:  0.0 mmHg AV VTI:            0.201 m AV Peak Grad:      5.5 mmHg AV Mean Grad:      4.0 mmHg LVOT Vmax:         57.20 cm/s LVOT Vmean:        36.200 cm/s LVOT VTI:          0.088 m LVOT/AV VTI ratio: 0.44 AI PHT:            643 msec  AORTA Ao Root diam: 4.10 cm Ao Asc diam:  4.20 cm MITRAL VALVE                TRICUSPID  VALVE MV Area (PHT): 3.99 cm     TV Peak grad:   58.1 mmHg MV Decel Time: 190 msec     TV Mean grad:   36.0 mmHg MV E velocity: 127.00 cm/s  TV Vmax:        3.81 m/s MV A velocity: 99.20 cm/s   TV Vmean:       282.0 cm/s MV E/A ratio:  1.28         TV VTI:         1.15 msec                             TR Peak grad:  39.2 mmHg                             TR Vmax:        313.00 cm/s                              SHUNTS                             Systemic VTI:  0.09 m                             Systemic Diam: 2.00 cm                             Pulmonic Diam: 2.80 cm Eleonore Chiquito MD Electronically signed by Eleonore Chiquito MD Signature Date/Time: 02/28/2021/9:30:52 AM    Final      Scheduled Meds:  apixaban  2.5 mg Oral BID   atorvastatin  40 mg Oral QPM   brimonidine  1 drop Both Eyes BID   carvedilol  3.125 mg Oral BID WC   donepezil  5 mg Oral BID   dorzolamide-timolol  1 drop Both Eyes BID   memantine  10 mg Oral BID   tamsulosin  0.4 mg Oral Daily   Continuous Infusions:  ampicillin-sulbactam (UNASYN) IV       LOS: 2 days    Time spent: 86min    Domenic Polite, MD [facility]   03/01/2021, 1:27 PM

## 2021-03-01 NOTE — Progress Notes (Signed)
Palliative-   Consult received- chart reviewed- spoke with patient's spouse- plan to meet tomorrow morning at 1130 for GOC discussion.   Ocie Bob, AGNP-C Palliative Medicine  No charge

## 2021-03-01 NOTE — Progress Notes (Signed)
Pt's wife stated that she was concerned with the pt's ability to speak. She stated he suddenly became quiet and wouldn't respond to her. I reassessed the pt. He was responsive, vocal and no changes were noted. Pt is still A+O x1, restless and pulling at cords.

## 2021-03-01 NOTE — TOC Progression Note (Signed)
Transition of Care [facility]) - Progression Note    Patient Details  Name: Tyler Carr MRN: [id number] Date of Birth: 09-Oct-1930  Transition of Care [facility]) CM/SW Contact  Leone Haven, RN Phone Number: 03/01/2021, 12:24 PM  Clinical Narrative:    from home with wife, wife is caregiver, pna, iv abx, cret up, for swallow study, palliative consulted.  TOC will continue to follow for dc needs.        Expected Discharge Plan and Services                                                 Social Determinants of Health (SDOH) Interventions    Readmission Risk Interventions No flowsheet data found.

## 2021-03-01 NOTE — Evaluation (Signed)
Clinical/Bedside Swallow Evaluation Patient Details  Name: Tyler Carr MRN: [id number] Date of Birth: July 31, 1930  Today's Date: 03/01/2021 Time: SLP Start Time (ACUTE ONLY): 0915 SLP Stop Time (ACUTE ONLY): 0943 SLP Time Calculation (min) (ACUTE ONLY): 28 min  Past Medical History:  Past Medical History:  Diagnosis Date   CHF (congestive heart failure) (HCC)    Dementia (HCC)    Hyperlipidemia    Hypertension    Past Surgical History:  Past Surgical History:  Procedure Laterality Date   ABDOMINAL AORTOGRAM N/A 02/04/2018   Procedure: ABDOMINAL AORTOGRAM;  Surgeon: Nada Libman, MD;  Location: [facility] INVASIVE CV LAB;  Service: Cardiovascular;  Laterality: N/A;   HERNIA REPAIR     PERIPHERAL VASCULAR INTERVENTION Left 02/04/2018   Procedure: PERIPHERAL VASCULAR INTERVENTION;  Surgeon: Nada Libman, MD;  Location: [facility] INVASIVE CV LAB;  Service: Cardiovascular;  Laterality: Left;  Renal artery   HPI:  Tyler Carr is an 88 yo male with  h/o dementia, CKD, Afib adm to [facility] with acute on chronic systolic heart failure, core pulmonale and diagnosed with delirum- Chest imaging concerning for potential pna.  He resides at home with his spouse. Spouse reports he could feed himself before coming to hospital and his current mentation is a significant change.  Swallow evaluation ordered due to concern for aspiration.    Assessment / Plan / Recommendation  Clinical Impression  Tyler Carr presents with cognitive based dysphagia c/b decreased labial seal, prolonged mastication and clinically judged delayed oral transiting.  Wife present and reports pt "eats slowly at home".  He did assist in self feeding - holding his cup and Trembath cracker with assist to place in hand and cues to bring to his mouth intially.  Delayed throat clear and cough noted x2 during entire snack  - when using liquids to aid oral transiting of solids. Maintaining separation and using puree to transit solids effective and helpful to  prevent cough.  Pt consumed 4 ounces grape juice, 4 ounces coffee, 2 ounces water, 3 Gentzler crackers, 2 ounces applesauce and 3 bites of cream of wheat.  He essentially conducted 3 ounce test but with grape juice - no clinical indication of aspiration.  Recommend advance to dys3/thin with precautions. SLP educated wife to dysphagia compensation strategies using teach back and posted swallow precaution signs.  Will follow briefly for tolerance given concern for pna, pt's dysphagia and dementia. SLP Visit Diagnosis: Dysphagia, oral phase (R13.11);Dysphagia, unspecified (R13.10)    Aspiration Risk  Moderate aspiration risk    Diet Recommendation Dysphagia 3 (Mech soft);Thin liquid   Liquid Administration via: Cup;Straw;Spoon Medication Administration: Whole meds with puree Supervision: Staff to assist with self feeding (family ok to provide assist) Postural Changes: Remain upright for at least 30 minutes after po intake;Seated upright at 90 degrees    Other  Recommendations Oral Care Recommendations: Oral care BID    Recommendations for follow up therapy are one component of a multi-disciplinary discharge planning process, led by the attending physician.  Recommendations may be updated based on patient status, additional functional criteria and insurance authorization.  Follow up Recommendations Skilled nursing-short term rehab (<3 hours/day)      Assistance Recommended at Discharge    Functional Status Assessment Patient has had a recent decline in their functional status and demonstrates the ability to make significant improvements in function in a reasonable and predictable amount of time.  Frequency and Duration min 1 x/week  1 week       Prognosis  Prognosis for Safe Diet Advancement: Fair Barriers to Reach Goals: Cognitive deficits      Swallow Study   General Date of Onset: 03/01/21 HPI: Tyler Carr is an 88 yo male with  h/o dementia, CKD, Afib adm to [facility] with acute on chronic  systolic heart failure, core pulmonale and diagnosed with delirum- Chest imaging concerning for potential pna.  He resides at home with his spouse. Spouse reports he could feed himself before coming to hospital and his current mentation is a significant change.  Swallow evaluation ordered due to concern for aspiration. Type of Study: Bedside Swallow Evaluation Diet Prior to this Study: Dysphagia 1 (puree);Thin liquids Temperature Spikes Noted: No Respiratory Status: Nasal cannula History of Recent Intubation: No Behavior/Cognition: Alert;Doesn't follow directions;Requires cueing (does not consistently follows directions, smiles appropriately to visual cues) Oral Cavity Assessment: Within Functional Limits Oral Care Completed by SLP: No Oral Cavity - Dentition: Adequate natural dentition Self-Feeding Abilities: Needs assist;Other (Comment) (hand over hand assist to hold cup) Patient Positioning: Upright in bed Baseline Vocal Quality: Low vocal intensity Volitional Cough: Cognitively unable to elicit Volitional Swallow: Unable to elicit    Oral/Motor/Sensory Function Overall Oral Motor/Sensory Function: Generalized oral weakness   Ice Chips Ice chips: Not tested   Thin Liquid Thin Liquid: Impaired Presentation: Straw Oral Phase Impairments: Reduced labial seal Pharyngeal  Phase Impairments: Suspected delayed Swallow;Cough - Delayed    Nectar Thick Nectar Thick Liquid: Not tested   Honey Thick Honey Thick Liquid: Not tested   Puree Puree: Impaired Presentation: Spoon Oral Phase Impairments: Reduced lingual movement/coordination;Reduced labial seal Oral Phase Functional Implications: Prolonged oral transit;Oral holding Pharyngeal Phase Impairments: Suspected delayed Swallow   Solid     Solid: Impaired Oral Phase Impairments: Reduced labial seal;Reduced lingual movement/coordination;Impaired mastication Oral Phase Functional Implications: Prolonged oral transit;Oral holding Pharyngeal  Phase Impairments: Suspected delayed Swallow     Rolena Infante, MS [facility] SLP Acute Rehab Services Office [phone] Pager [phone]  Chales Abrahams 03/01/2021,10:35 AM

## 2021-03-01 NOTE — Progress Notes (Signed)
Mobility Specialist Progress Note:   03/01/21 1620  Mobility  Activity Stood at bedside  Level of Assistance Maximum assist, patient does 25-49%  Assistive Device Front wheel walker  Activity Response Tolerated poorly  $Mobility charge 1 Mobility   Responded to bed alarm, pt attempted to get OOB. Required modA +2 to stand from EOB to clean bed. Pt required MaxA+2 to get pt back to bed d/t pt becoming defiant. RN in to give Haldol, pt calmed down for a few minutes. Pt left in bed with bed alarm on.   Addison Lank Acute Rehabilitation Services Phone: [phone] Office Phone: [phone]

## 2021-03-02 DIAGNOSIS — R410 Disorientation, unspecified: Secondary | ICD-10-CM

## 2021-03-02 DIAGNOSIS — J690 Pneumonitis due to inhalation of food and vomit: Secondary | ICD-10-CM

## 2021-03-02 DIAGNOSIS — F02B11 Dementia in other diseases classified elsewhere, moderate, with agitation: Secondary | ICD-10-CM

## 2021-03-02 DIAGNOSIS — G309 Alzheimer's disease, unspecified: Secondary | ICD-10-CM

## 2021-03-02 DIAGNOSIS — Z515 Encounter for palliative care: Secondary | ICD-10-CM

## 2021-03-02 DIAGNOSIS — R338 Other retention of urine: Secondary | ICD-10-CM | POA: Diagnosis present

## 2021-03-02 LAB — CBC
HCT: 43.4 % (ref 39.0–52.0)
Hemoglobin: 14.8 g/dL (ref 13.0–17.0)
MCH: 32.5 pg (ref 26.0–34.0)
MCHC: 34.1 g/dL (ref 30.0–36.0)
MCV: 95.2 fL (ref 80.0–100.0)
Platelets: 227 10*3/uL (ref 150–400)
RBC: 4.56 MIL/uL (ref 4.22–5.81)
RDW: 14.3 % (ref 11.5–15.5)
WBC: 10.8 10*3/uL — ABNORMAL HIGH (ref 4.0–10.5)
nRBC: 0.2 % (ref 0.0–0.2)

## 2021-03-02 LAB — BASIC METABOLIC PANEL
Anion gap: 15 (ref 5–15)
BUN: 55 mg/dL — ABNORMAL HIGH (ref 8–23)
CO2: 23 mmol/L (ref 22–32)
Calcium: 9 mg/dL (ref 8.9–10.3)
Chloride: 112 mmol/L — ABNORMAL HIGH (ref 98–111)
Creatinine, Ser: 1.69 mg/dL — ABNORMAL HIGH (ref 0.61–1.24)
GFR, Estimated: 39 mL/min — ABNORMAL LOW (ref >60)
Glucose, Bld: 111 mg/dL — ABNORMAL HIGH (ref 70–99)
Potassium: 3.3 mmol/L — ABNORMAL LOW (ref 3.5–5.1)
Sodium: 150 mmol/L — ABNORMAL HIGH (ref 135–145)

## 2021-03-02 LAB — MAGNESIUM: Magnesium: 2.5 mg/dL — ABNORMAL HIGH (ref 1.7–2.4)

## 2021-03-02 MED ORDER — POTASSIUM CHLORIDE CRYS ER 20 MEQ PO TBCR
40.0000 meq | EXTENDED_RELEASE_TABLET | Freq: Once | ORAL | Status: AC
  Administered 2021-03-02: 40 meq via ORAL
  Filled 2021-03-02: qty 2

## 2021-03-02 MED ORDER — CHLORHEXIDINE GLUCONATE CLOTH 2 % EX PADS: 6.0000 | MEDICATED_PAD | Freq: Every day | CUTANEOUS | Status: DC

## 2021-03-02 MED ORDER — CHLORHEXIDINE GLUCONATE CLOTH 2 % EX PADS
6.0000 | MEDICATED_PAD | Freq: Every day | CUTANEOUS | Status: DC
  Administered 2021-03-03 – 2021-03-05 (×3): 6 via TOPICAL

## 2021-03-02 NOTE — Assessment & Plan Note (Addendum)
-  Suspected, pulmonary infiltrates noted on imaging  -Diuretics as above, treated with IV Unasyn X 4 days -SLP evaluation completed -Overnight aspirated, developed worsening pneumonia, hypoxia, respiratory distress and encephalopathy -Now comfort care, plan for residential hospice

## 2021-03-02 NOTE — Consult Note (Signed)
Consultation Note Date: 03/02/2021   Patient Name: Tyler Carr  DOB: 01-31-1930  MRN: [id number]  Age / Sex: 88 y.o., male  PCP: [facility] Referring Physician: Domenic Polite, MD  Reason for Consultation: Goals of care  HPI/Patient Profile: 88 y.o. male  with past medical history of alzheimer's dementia, CKD, a fib admitted on 02/27/2021 with worsening shortness of breath and edema. Workup revealed bilateral pleural effusions, acute on chronic CHF, aspiration pneumonia. He has been treated with antibiotics and aggressive diuresis. Cr is trending up. Admission has been complicated with some delirium. Palliative medicine consulted for [place].  Primary Decision Maker NEXT OF KIN- spouse- Dmetrius Ambs  Discussion: I have reviewed medical records including EPIC notes, labs and imaging, assessed the patient and then met with his spouse to discuss diagnosis prognosis, GOC, EOL wishes, disposition and options.  I introduced Palliative Medicine as specialized medical care for people living with serious illness. It focuses on providing relief from the symptoms and stress of a serious illness. The goal is to improve quality of life for both the patient and the family.  We discussed a brief life review of the patient. 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 He and his wife are from [place], [place].   As far as functional and nutritional status- prior to this admission he was able to ambulate in the home, toilet himself, feed himself. He had cognitive deficits in memory and judgement. He has a good appetite, but has had a decrease in his appetite over the last several months- has lost weight.    We discussed patient's current illness and what it means in the larger context of patient's on-going co-morbidities.   Natural disease trajectory was discussed.   I attempted to elicit values and goals of care important to the patient. Per Pamala Hurry the goals are to continue to focus on quantity, but also quality, as long as patient is suffering. Suffering is defined as pain. Mobility is not important, being bedbound, or wheelchair bound is acceptable.  It is also a goal to keep [place] at home.    Advance directives, concepts specific to code status,  and rehospitalization were considered and discussed. DNR code status is in place. If [place] were to decline again in the future, Pamala Hurry would wish for rehospitalization and hope for stabilization.   Discussed with patient/family the importance of continued conversation with family and the medical providers regarding overall plan of care and treatment options, ensuring decisions are within the context of the patients values and GOCs.    Hospice and Palliative Care services outpatient were explained and offered. Pamala Hurry is agreeable to referral for outpatient Palliative.   Questions and concerns were addressed. The family was encouraged to call with questions or concerns.     SUMMARY OF RECOMMENDATIONS -CHF exacerbation in the setting of pnuemonia, AKI on CKD- continue current care, no de-escalation -Hopeful for d/c home with home  health, PT, and Palliative support- patient is veteran and service connected- will make transition of care referral for assistance- would not want to go to SNF rehab -PMT will continue to follow and monitor for any decompensation requiring another goals of care discussion    Code Status/Advance Care Planning: DNR   Prognosis:   Unable to determine  Discharge Planning: Home with Home Health  Primary Diagnoses: Present on Admission:  Acute on chronic combined systolic and diastolic CHF (congestive heart failure) (HCC)  Dementia with behavioral disturbance  Permanent atrial fibrillation ([place])  Hyperlipidemia  Acute kidney injury  superimposed on chronic kidney disease ([place])  Pulmonary infiltrates   Review of Systems  Unable to perform ROS: Dementia   Physical Exam Vitals and nursing note reviewed.  Constitutional:      Comments: frail  Pulmonary:     Effort: Pulmonary effort is normal.  Neurological:     Mental Status: He is disoriented.    Vital Signs: BP (!) 145/111 (BP Location: Left Arm)    Pulse 72    Temp 98.3 F (36.8 C) (Axillary)    Resp 20    Ht _0  (1.778 m)    Wt 67 kg    SpO2 94%    BMI 21.19 kg/m  Pain Scale: PAINAD   Pain Score: 0-No pain   SpO2: SpO2: 94 % O2 Device:SpO2: 94 % O2 Flow Rate: .O2 Flow Rate (L/min): 3 L/min  IO: Intake/output summary:  Intake/Output Summary (Last 24 hours) at 03/02/2021 1240 Last data filed at 03/02/2021 0200 Gross per 24 hour  Intake 240 ml  Output 900 ml  Net -660 ml    LBM: Last BM Date : 02/27/21 Baseline Weight: Weight: 66.6 kg Most recent weight: Weight: 67 kg     Palliative Assessment/Data: PPS:  30%       Thank you for this consult. Palliative medicine will continue to follow and assist as needed.   Time Total: 110 minutes Greater than 50%  of this time was spent counseling and coordinating care related to the above assessment and plan.  Signed by: Mariana Kaufman, AGNP-C Palliative Medicine    Please contact Palliative Medicine Team phone at [phone] for questions and concerns.  For individual provider: See Shea Evans

## 2021-03-02 NOTE — Progress Notes (Signed)
PROGRESS NOTE    Tyler Carr  [id number] DOB: August 05, 1930 DOA: 02/27/2021 PCP: [facility]  Brief Narrative:89/M with history of dementia, chronic systolic CHF, chronic A-fib on apixaban was brought to the ED by his wife due to worsening dyspnea and edema X 7 days, in the ED creatinine was 1.2, BNP> 4500, chest x-ray noted bilateral pleural effusion right> left,?  Loculation and upper and lower lobe interstitial infiltrates. -Hospitalization complicated by agitation, delirium overnight requiring Haldol and Ativan   Subjective: -Agitation and urinary retention overnight   Assessment and Plan: * Acute on chronic combined systolic and diastolic CHF (congestive heart failure) (HCC)- (present on admission) -Echocardiogram with LV EF 35 to 40% with global hypokinesis, positive LVH, moderate reduction in RV systolic function, RSVP 54, severe tricuspid regurgitation, aortic valve with moderate to severe regurgitation -Diuresed with IV Lasix on admission, unfortunately creatinine trended up and sodium went to 151 -Will hold Lasix and Entresto again today -Overall prognosis is poor in the setting of dementia, CHF, cor pulmonale, valvular heart disease -Palliative care consulted for goals of care  Aspiration pneumonia (HCC) -Suspected, pulmonary infiltrates noted on imaging  -Diuretics as above, continue IV Unasyn date 3 -Repeat chest x-ray tomorrow -SLP evaluation completed, no significant dysphagia noted    Acute kidney injury superimposed on chronic kidney disease ([place])- (present on admission) CKD stage 3a/ hypernatremia -Creatinine has slowly trended up from 1.2-1.3 and now 1.6 -Holding Lasix and Entresto today -Monitor urine output, kidney function -Urine output is somewhat poor, check bladder scan to rule out partial retention  Permanent atrial fibrillation ([place])- (present on admission) Continue carvedilol apixaban, dose decreased  Dementia with behavioral  disturbance- (present on admission) - Concern for progressive dementia, significant agitation this admission especially in the evenings after wife leaves -Continue Aricept and Namenda, as needed Haldol  Acute urinary retention- (present on admission) Required I's/O cath last night, add Flomax, monitor for recurrent retention, may need Foley  Hyperlipidemia- (present on admission) Continue with statin therapy.    DVT prophylaxis: Apixaban Code Status: DNR Family Communication: Discussed with wife at bedside Disposition Plan: To be determined, would be appropriate for home with hospice  Consultants:  Palliative care  Procedures:   Antimicrobials:    Objective: Vitals:   03/01/21 1929 03/01/21 1930 03/01/21 1931 03/02/21 0228  BP:    (!) 145/111  Pulse:    72  Resp: (!) 33 20 (!) 29 20  Temp:    98.3 F (36.8 C)  TempSrc:    Axillary  SpO2: 100% 100% 100% 94%  Weight:    67 kg  Height:        Intake/Output Summary (Last 24 hours) at 03/02/2021 1248 Last data filed at 03/02/2021 0200 Gross per 24 hour  Intake 240 ml  Output 900 ml  Net -660 ml   Filed Weights   02/28/21 1400 03/01/21 0245 03/02/21 0228  Weight: 66.6 kg 66.5 kg 67 kg    Examination:  General exam: Elderly chronically ill male sitting up in bed, awake, partly alert, answers a few questions, significant cognitive deficits HEENT: Positive JVD CVS: S1-S2, regular rhythm Lungs: Few basilar rales, scattered rhonchi Abdomen: Obese, soft, nontender, bowel sounds present Extremities: 1-2+ edema Skin: No rashes Psychiatry: Somewhat confused, very poor insight and judgment    Data Reviewed:   CBC: Recent Labs  Lab 02/27/21 1425 02/28/21 0414 03/01/21 0437 03/02/21 0506  WBC 7.5 8.4 9.7 10.8*  HGB 14.9 13.3 12.7* 14.8  HCT 46.8 40.6 38.5*  43.4  MCV 99.6 98.8 97.2 95.2  PLT 177 195 179 [id number]   Basic Metabolic Panel: Recent Labs  Lab 02/27/21 1425 02/28/21 0414 03/01/21 0437 03/02/21 0506   NA 146* 147* 151* 150*  K 3.9 3.5 4.2 3.3*  CL 110 110 114* 112*  CO2 23 21* 22 23  GLUCOSE 111* 153* 108* 111*  BUN 31* 30* 46* 55*  CREATININE 1.27* 1.31* 1.66* 1.69*  CALCIUM 9.3 8.9 8.8* 9.0  MG 2.4  --   --  2.5*   GFR: Estimated Creatinine Clearance: 28.6 mL/min (A) (by C-G formula based on SCr of 1.69 mg/dL (H)). Liver Function Tests: No results for input(s): AST, ALT, ALKPHOS, BILITOT, PROT, ALBUMIN in the last 168 hours. No results for input(s): LIPASE, AMYLASE in the last 168 hours. No results for input(s): AMMONIA in the last 168 hours. Coagulation Profile: No results for input(s): INR, PROTIME in the last 168 hours. Cardiac Enzymes: No results for input(s): CKTOTAL, CKMB, CKMBINDEX, TROPONINI in the last 168 hours. BNP (last 3 results) No results for input(s): PROBNP in the last 8760 hours. HbA1C: No results for input(s): HGBA1C in the last 72 hours. CBG: No results for input(s): GLUCAP in the last 168 hours. Lipid Profile: No results for input(s): CHOL, HDL, LDLCALC, TRIG, CHOLHDL, LDLDIRECT in the last 72 hours. Thyroid Function Tests: No results for input(s): TSH, T4TOTAL, FREET4, T3FREE, THYROIDAB in the last 72 hours. Anemia Panel: No results for input(s): VITAMINB12, FOLATE, FERRITIN, TIBC, IRON, RETICCTPCT in the last 72 hours. Urine analysis:    Component Value Date/Time   COLORURINE YELLOW 05/10/2018 1709   APPEARANCEUR CLEAR 05/10/2018 1709   LABSPEC 1.012 05/10/2018 1709   PHURINE 5.0 05/10/2018 1709   GLUCOSEU NEGATIVE 05/10/2018 1709   HGBUR NEGATIVE 05/10/2018 1709   BILIRUBINUR NEGATIVE 05/10/2018 1709   KETONESUR NEGATIVE 05/10/2018 1709   PROTEINUR NEGATIVE 05/10/2018 1709   NITRITE NEGATIVE 05/10/2018 1709   LEUKOCYTESUR NEGATIVE 05/10/2018 1709   Sepsis Labs: @LABRCNTIP (procalcitonin:4,lacticidven:4)  ) Recent Results (from the past 240 hour(s))  Resp Panel by RT-PCR (Flu A&B, Covid) Nasopharyngeal Swab     Status: None    Collection Time: 02/28/21  1:14 PM   Specimen: Nasopharyngeal Swab; Nasopharyngeal(NP) swabs in vial transport medium  Result Value Ref Range Status   SARS Coronavirus 2 by RT PCR NEGATIVE NEGATIVE Final    Comment: (NOTE) SARS-CoV-2 target nucleic acids are NOT DETECTED.  The SARS-CoV-2 RNA is generally detectable in upper respiratory specimens during the acute phase of infection. The lowest concentration of SARS-CoV-2 viral copies this assay can detect is 138 copies/mL. A negative result does not preclude SARS-Cov-2 infection and should not be used as the sole basis for treatment or other patient management decisions. A negative result may occur with  improper specimen collection/handling, submission of specimen other than nasopharyngeal swab, presence of viral mutation(s) within the areas targeted by this assay, and inadequate number of viral copies(<138 copies/mL). A negative result must be combined with clinical observations, patient history, and epidemiological information. The expected result is Negative.  Fact Sheet for Patients:  EntrepreneurPulse.com.au  Fact Sheet for Healthcare Providers:  IncredibleEmployment.be  This test is no t yet approved or cleared by the [place] FDA and  has been authorized for detection and/or diagnosis of SARS-CoV-2 by FDA under an Emergency Use Authorization (EUA). This EUA will remain  in effect (meaning this test can be used) for the duration of the COVID-19 declaration under Section 564(b)(1) of the Act, 21  U.S.C.section 360bbb-3(b)(1), unless the authorization is terminated  or revoked sooner.       Influenza A by PCR NEGATIVE NEGATIVE Final   Influenza B by PCR NEGATIVE NEGATIVE Final    Comment: (NOTE) The Xpert Xpress SARS-CoV-2/FLU/RSV plus assay is intended as an aid in the diagnosis of influenza from Nasopharyngeal swab specimens and should not be used as a sole basis for treatment.  Nasal washings and aspirates are unacceptable for Xpert Xpress SARS-CoV-2/FLU/RSV testing.  Fact Sheet for Patients: EntrepreneurPulse.com.au  Fact Sheet for Healthcare Providers: IncredibleEmployment.be  This test is not yet approved or cleared by the [place] FDA and has been authorized for detection and/or diagnosis of SARS-CoV-2 by FDA under an Emergency Use Authorization (EUA). This EUA will remain in effect (meaning this test can be used) for the duration of the COVID-19 declaration under Section 564(b)(1) of the Act, 21 U.S.C. section 360bbb-3(b)(1), unless the authorization is terminated or revoked.  Performed at [facility], [place] [address] [place], [place] [address]      Radiology Studies: DG CHEST PORT 1 VIEW  Result Date: 03/01/2021 CLINICAL DATA:  Dyspnea EXAM: PORTABLE CHEST 1 VIEW COMPARISON:  02/27/2021 FINDINGS: Rotated AP portable examination. No significant change in layering bilateral pleural effusions, dense bilateral heterogeneous and interstitial airspace opacity, and gross cardiomegaly. IMPRESSION: No significant change in layering bilateral pleural effusions, dense bilateral heterogeneous and interstitial airspace opacity, and gross cardiomegaly. Findings are consistent with edema, infection, and/or ARDS. Electronically Signed   By: Delanna Ahmadi M.D.   On: 03/01/2021 08:26     Scheduled Meds:  apixaban  2.5 mg Oral BID   atorvastatin  40 mg Oral QPM   brimonidine  1 drop Both Eyes BID   carvedilol  3.125 mg Oral BID WC   donepezil  5 mg Oral BID   dorzolamide-timolol  1 drop Both Eyes BID   memantine  10 mg Oral BID   tamsulosin  0.4 mg Oral Daily   Continuous Infusions:  ampicillin-sulbactam (UNASYN) IV 3 g (03/02/21 0914)     LOS: 3 days    Time spent: 45min    Domenic Polite, MD [facility]   03/02/2021, 12:48 PM

## 2021-03-02 NOTE — Progress Notes (Signed)
Pt had 18 beat run of vtach. No acute changes, no CP, VS wnl. MD paged. See new orders.

## 2021-03-02 NOTE — Progress Notes (Signed)
Speech Language Pathology Treatment: Dysphagia  Patient Details Name: Tyler Carr MRN: [id number] DOB: [birth year] Today's Date: 03/02/2021 Time: 3474-2595 SLP Time Calculation (min) (ACUTE ONLY): 28 min  Assessment / Plan / Recommendation Clinical Impression  Follow up for dysphagia management indicated. Pt alert today - inconsistently follows directions.  His speech is largely unintelligible and voice is weak likely due to dementia/current illness.  He does verbalize yes/no appropriately regarding desire for more food/liquids.   Spouse reports she fed the pt applesauce and liquids via tsp this am but advised pt did not want pancakes or sausage.  Pt was willing to consume po with this therapist however.  Provided pt with pancakes with syrup, applesauce and water.  He continues to demonstrate prolonged mastication but use of applesauce does faciliate oral transiting and swallow.  Water via straw provided with pt helping hand over hand for swallowing - No indication of aspiration noted.  Using teach back, reiterated to wife swallow precautions.  Provided menu to wife and encouraged her to order foods pt enjoys.  No SLP follow up indicated as all education for dysphagia mitigation reviewed and wife reports implementing.  Recommend to consider liquid nutrition supplements *Ie Ensure* to maximize efficiency with nutrition.  Thanks for allowing SLP to help in pt's care plan.    HPI HPI: Tyler Carr is an 88 yo male with  h/o dementia, CKD, Afib adm to [facility] with acute on chronic systolic heart failure, core pulmonale and diagnosed with delirum- Chest imaging concerning for potential pna.  He resides at home with his spouse. Spouse reports he could feed himself before coming to hospital and his current mentation is a significant change.  Swallow evaluation ordered due to concern for aspiration.      SLP Plan  All goals met      Recommendations for follow up therapy are one component of a multi-disciplinary  discharge planning process, led by the attending physician.  Recommendations may be updated based on patient status, additional functional criteria and insurance authorization.    Recommendations  Diet recommendations: Dysphagia 3 (mechanical soft);Thin liquid Liquids provided via: Straw;Cup;Teaspoon Medication Administration: Whole meds with puree Supervision: Staff to assist with self feeding Compensations: Minimize environmental distractions;Slow rate;Small sips/bites Postural Changes and/or Swallow Maneuvers: Seated upright 90 degrees;Upright 30-60 min after meal                Oral Care Recommendations: Oral care BID Follow Up Recommendations: Skilled nursing-short term rehab (<3 hours/day) SLP Visit Diagnosis: Dysphagia, oral phase (R13.11);Dysphagia, unspecified (R13.10) Plan: All goals met           Macario Golds. Kathleen Lime, MS [place] Office [phone] Pager [phone]    03/02/2021, 9:05 AM

## 2021-03-02 NOTE — Assessment & Plan Note (Addendum)
-   Started on Flomax, unfortunately had to have urinary catheter placed yesterday for recurrent retention

## 2021-03-03 DIAGNOSIS — Z7189 Other specified counseling: Secondary | ICD-10-CM

## 2021-03-03 LAB — CBC
HCT: 45.5 % (ref 39.0–52.0)
Hemoglobin: 15.1 g/dL (ref 13.0–17.0)
MCH: 32 pg (ref 26.0–34.0)
MCHC: 33.2 g/dL (ref 30.0–36.0)
MCV: 96.4 fL (ref 80.0–100.0)
Platelets: 249 10*3/uL (ref 150–400)
RBC: 4.72 MIL/uL (ref 4.22–5.81)
RDW: 14.3 % (ref 11.5–15.5)
WBC: 11.5 10*3/uL — ABNORMAL HIGH (ref 4.0–10.5)
nRBC: 0 % (ref 0.0–0.2)

## 2021-03-03 LAB — BASIC METABOLIC PANEL
Anion gap: 11 (ref 5–15)
BUN: 53 mg/dL — ABNORMAL HIGH (ref 8–23)
CO2: 28 mmol/L (ref 22–32)
Calcium: 9 mg/dL (ref 8.9–10.3)
Chloride: 110 mmol/L (ref 98–111)
Creatinine, Ser: 1.56 mg/dL — ABNORMAL HIGH (ref 0.61–1.24)
GFR, Estimated: 42 mL/min — ABNORMAL LOW (ref >60)
Glucose, Bld: 123 mg/dL — ABNORMAL HIGH (ref 70–99)
Potassium: 3.5 mmol/L (ref 3.5–5.1)
Sodium: 149 mmol/L — ABNORMAL HIGH (ref 135–145)

## 2021-03-03 MED ORDER — SODIUM CHLORIDE 0.9 % IV SOLN
3.0000 g | Freq: Four times a day (QID) | INTRAVENOUS | Status: AC
  Administered 2021-03-03: 3 g via INTRAVENOUS
  Filled 2021-03-03: qty 8

## 2021-03-03 MED ORDER — FUROSEMIDE 20 MG PO TABS
20.0000 mg | ORAL_TABLET | Freq: Every day | ORAL | Status: DC
  Administered 2021-03-03: 20 mg via ORAL
  Filled 2021-03-03: qty 1

## 2021-03-03 MED ORDER — SODIUM CHLORIDE 0.9 % IV SOLN
3.0000 g | Freq: Four times a day (QID) | INTRAVENOUS | Status: DC
  Administered 2021-03-03: 3 g via INTRAVENOUS
  Filled 2021-03-03 (×3): qty 8

## 2021-03-03 MED ORDER — HALOPERIDOL LACTATE 5 MG/ML IJ SOLN
1.0000 mg | Freq: Four times a day (QID) | INTRAMUSCULAR | Status: DC | PRN
  Administered 2021-03-04: 1 mg via INTRAVENOUS
  Filled 2021-03-03 (×2): qty 1

## 2021-03-03 MED ORDER — HALOPERIDOL 1 MG PO TABS
1.0000 mg | ORAL_TABLET | Freq: Four times a day (QID) | ORAL | Status: DC | PRN
  Filled 2021-03-03: qty 1

## 2021-03-03 MED ORDER — AMOXICILLIN-POT CLAVULANATE 875-125 MG PO TABS
1.0000 | ORAL_TABLET | Freq: Two times a day (BID) | ORAL | Status: DC
  Administered 2021-03-04: 1 via ORAL
  Filled 2021-03-03: qty 1

## 2021-03-03 NOTE — Progress Notes (Signed)
PROGRESS NOTE    Tyler Carr  [id number] DOB: 07-05-1930 DOA: 02/27/2021 PCP: [facility]  Brief Narrative:89/M with history of dementia, chronic systolic CHF, chronic A-fib on apixaban was brought to the ED by his wife due to worsening dyspnea and edema X 7 days, in the ED creatinine was 1.2, BNP> 4500, chest x-ray noted bilateral pleural effusion right> left,?  Loculation and upper and lower lobe interstitial infiltrates. -Hospitalization complicated by agitation, delirium overnight requiring Haldol and Ativan   Subjective: -Foley catheter placed for urinary retention -Continues to have intermittent agitation, more calm this morning   Assessment and Plan: * Acute on chronic combined systolic and diastolic CHF (congestive heart failure) ([place])- (present on admission) -Echocardiogram with LV EF 35 to 40% with global hypokinesis, positive LVH, moderate reduction in RV systolic function, RSVP 54, severe tricuspid regurgitation, aortic valve with moderate to severe regurgitation -Diuresed with IV Lasix on admission, unfortunately creatinine trended up and sodium went to 151 -Sodium, kidney function stabilizing will resume Lasix p.o. today -Overall prognosis is poor in the setting of dementia, CHF, cor pulmonale, valvular heart disease -Palliative care consulted for goals of care, had meeting yesterday, wife was not agreeable for hospice services then, discussed poor prognosis and ongoing failure to thrive with advanced age, progressive dementia and multiple medical problems, she is now agreeable to home hospice when medically optimized, TOC consulted  Aspiration pneumonia ([place]) -Suspected, pulmonary infiltrates noted on imaging  -Diuretics as above, continue IV Unasyn day 4, transition to oral antibiotics tomorrow -SLP evaluation completed, no significant dysphagia noted  Acute kidney injury superimposed on chronic kidney disease ([place])- (present on admission) CKD stage 3a/  hypernatremia -Creatinine has slowly trended up from 1.2-1.3 and trended up to 1.6 with hyponatremia -Held Lasix and Entresto -Now kidney function is stable, restart Lasix p.o.  Permanent atrial fibrillation ([place])- (present on admission) Continue carvedilol apixaban, dose decreased  Dementia with behavioral disturbance- (present on admission) - Concern for progressive dementia, significant agitation this admission especially in the evenings after wife leaves -Continue Aricept and Namenda, as needed Haldol  Acute urinary retention- (present on admission) - Started on Flomax, unfortunately had to have urinary catheter placed yesterday for recurrent retention  Hyperlipidemia- (present on admission) Continue with statin therapy.    DVT prophylaxis: Apixaban Code Status: DNR Family Communication: Discussed with wife at bedside Disposition Plan: Home with hospice services in 1 to 2 days  Consultants:  Palliative care  Procedures:   Antimicrobials:    Objective: Vitals:   03/03/21 0451 03/03/21 0850 03/03/21 0855 03/03/21 1120  BP: (!) 140/95 (!) 156/91 (!) 156/91 (!) 160/93  Pulse: 86 88  83  Resp: 20   20  Temp: 98 F (36.7 C) (!) 97.4 F (36.3 C)  97.8 F (36.6 C)  TempSrc: Oral Oral  Oral  SpO2: 92% 100%  100%  Weight: 66.9 kg     Height:        Intake/Output Summary (Last 24 hours) at 03/03/2021 1215 Last data filed at 03/03/2021 1033 Gross per 24 hour  Intake 210.86 ml  Output 1300 ml  Net -1089.14 ml   Filed Weights   03/01/21 0245 03/02/21 0228 03/03/21 0451  Weight: 66.5 kg 67 kg 66.9 kg    Examination:  General exam: Elderly chronically ill male sitting up in bed, awake, partly alert, answers a few questions, significant cognitive deficits HEENT: No JVD CVS: S1-S2, regular rhythm Lungs: Few basilar rales, soft scattered rhonchi Abdomen: Obese, soft, nontender, bowel  sounds present Extremities: Trace edema  Skin: No rashes Psychiatry: Somewhat  confused, very poor insight and judgment    Data Reviewed:   CBC: Recent Labs  Lab 02/27/21 1425 02/28/21 0414 03/01/21 0437 03/02/21 0506 03/03/21 0420  WBC 7.5 8.4 9.7 10.8* 11.5*  HGB 14.9 13.3 12.7* 14.8 15.1  HCT 46.8 40.6 38.5* 43.4 45.5  MCV 99.6 98.8 97.2 95.2 96.4  PLT 177 195 179 227 249   Basic Metabolic Panel: Recent Labs  Lab 02/27/21 1425 02/28/21 0414 03/01/21 0437 03/02/21 0506 03/03/21 0420  NA 146* 147* 151* 150* 149*  K 3.9 3.5 4.2 3.3* 3.5  CL 110 110 114* 112* 110  CO2 23 21* 22 23 28   GLUCOSE 111* 153* 108* 111* 123*  BUN 31* 30* 46* 55* 53*  CREATININE 1.27* 1.31* 1.66* 1.69* 1.56*  CALCIUM 9.3 8.9 8.8* 9.0 9.0  MG 2.4  --   --  2.5*  --    GFR: Estimated Creatinine Clearance: 31 mL/min (A) (by C-G formula based on SCr of 1.56 mg/dL (H)). Liver Function Tests: No results for input(s): AST, ALT, ALKPHOS, BILITOT, PROT, ALBUMIN in the last 168 hours. No results for input(s): LIPASE, AMYLASE in the last 168 hours. No results for input(s): AMMONIA in the last 168 hours. Coagulation Profile: No results for input(s): INR, PROTIME in the last 168 hours. Cardiac Enzymes: No results for input(s): CKTOTAL, CKMB, CKMBINDEX, TROPONINI in the last 168 hours. BNP (last 3 results) No results for input(s): PROBNP in the last 8760 hours. HbA1C: No results for input(s): HGBA1C in the last 72 hours. CBG: No results for input(s): GLUCAP in the last 168 hours. Lipid Profile: No results for input(s): CHOL, HDL, LDLCALC, TRIG, CHOLHDL, LDLDIRECT in the last 72 hours. Thyroid Function Tests: No results for input(s): TSH, T4TOTAL, FREET4, T3FREE, THYROIDAB in the last 72 hours. Anemia Panel: No results for input(s): VITAMINB12, FOLATE, FERRITIN, TIBC, IRON, RETICCTPCT in the last 72 hours. Urine analysis:    Component Value Date/Time   COLORURINE YELLOW 05/10/2018 1709   APPEARANCEUR CLEAR 05/10/2018 1709   LABSPEC 1.012 05/10/2018 1709   PHURINE 5.0  05/10/2018 1709   GLUCOSEU NEGATIVE 05/10/2018 1709   HGBUR NEGATIVE 05/10/2018 1709   BILIRUBINUR NEGATIVE 05/10/2018 1709   KETONESUR NEGATIVE 05/10/2018 1709   PROTEINUR NEGATIVE 05/10/2018 1709   NITRITE NEGATIVE 05/10/2018 1709   LEUKOCYTESUR NEGATIVE 05/10/2018 1709   Sepsis Labs: @LABRCNTIP (procalcitonin:4,lacticidven:4)  ) Recent Results (from the past 240 hour(s))  Resp Panel by RT-PCR (Flu A&B, Covid) Nasopharyngeal Swab     Status: None   Collection Time: 02/28/21  1:14 PM   Specimen: Nasopharyngeal Swab; Nasopharyngeal(NP) swabs in vial transport medium  Result Value Ref Range Status   SARS Coronavirus 2 by RT PCR NEGATIVE NEGATIVE Final    Comment: (NOTE) SARS-CoV-2 target nucleic acids are NOT DETECTED.  The SARS-CoV-2 RNA is generally detectable in upper respiratory specimens during the acute phase of infection. The lowest concentration of SARS-CoV-2 viral copies this assay can detect is 138 copies/mL. A negative result does not preclude SARS-Cov-2 infection and should not be used as the sole basis for treatment or other patient management decisions. A negative result may occur with  improper specimen collection/handling, submission of specimen other than nasopharyngeal swab, presence of viral mutation(s) within the areas targeted by this assay, and inadequate number of viral copies(<138 copies/mL). A negative result must be combined with clinical observations, patient history, and epidemiological information. The expected result is Negative.  Fact  Sheet for Patients:  EntrepreneurPulse.com.au  Fact Sheet for Healthcare Providers:  IncredibleEmployment.be  This test is no t yet approved or cleared by the [place] FDA and  has been authorized for detection and/or diagnosis of SARS-CoV-2 by FDA under an Emergency Use Authorization (EUA). This EUA will remain  in effect (meaning this test can be used) for the duration of  the COVID-19 declaration under Section 564(b)(1) of the Act, 21 U.S.C.section 360bbb-3(b)(1), unless the authorization is terminated  or revoked sooner.       Influenza A by PCR NEGATIVE NEGATIVE Final   Influenza B by PCR NEGATIVE NEGATIVE Final    Comment: (NOTE) The Xpert Xpress SARS-CoV-2/FLU/RSV plus assay is intended as an aid in the diagnosis of influenza from Nasopharyngeal swab specimens and should not be used as a sole basis for treatment. Nasal washings and aspirates are unacceptable for Xpert Xpress SARS-CoV-2/FLU/RSV testing.  Fact Sheet for Patients: EntrepreneurPulse.com.au  Fact Sheet for Healthcare Providers: IncredibleEmployment.be  This test is not yet approved or cleared by the [place] FDA and has been authorized for detection and/or diagnosis of SARS-CoV-2 by FDA under an Emergency Use Authorization (EUA). This EUA will remain in effect (meaning this test can be used) for the duration of the COVID-19 declaration under Section 564(b)(1) of the Act, 21 U.S.C. section 360bbb-3(b)(1), unless the authorization is terminated or revoked.  Performed at [facility], [place] [address], [place], [place] [address]      Radiology Studies: No results found.   Scheduled Meds:  apixaban  2.5 mg Oral BID   atorvastatin  40 mg Oral QPM   brimonidine  1 drop Both Eyes BID   carvedilol  3.125 mg Oral BID WC   Chlorhexidine Gluconate Cloth  6 each Topical Daily   donepezil  5 mg Oral BID   dorzolamide-timolol  1 drop Both Eyes BID   memantine  10 mg Oral BID   tamsulosin  0.4 mg Oral Daily   Continuous Infusions:  ampicillin-sulbactam (UNASYN) IV Stopped (03/03/21 1145)     LOS: 4 days    Time spent: 36min    Domenic Polite, MD [facility]   03/03/2021, 12:15 PM

## 2021-03-03 NOTE — Progress Notes (Addendum)
Civil engineer, contracting [facility]) Hospital Liaison: RN note    Notified by Transition of Care Manger Letha Cape, RN  of patient/family request for [facility] services at home after discharge. Chart and patient information under review by [facility] physician. Hospice eligibility confirmed.     Writer spoke with wife to initiate education related to hospice philosophy, services and team approach to care. Wife, [name] verbalized understanding of information given. Per discussion, plan is for discharge to home by  PTAR.   Please send signed and completed DNR form home with patient/family. Patient will need prescriptions for discharge comfort medications.     DME needs have been discussed, patient currently has the following equipment in the home: 3N1, W/C and grab bars.  Patient/family requests the following DME for delivery to the home:  hospital bed and oxygen. [facility] equipment manager has been notified and will contact DME provider to arrange delivery to the home. Home address has been verified and is correct in the chart.  Wife is the family member to contact to arrange time of delivery.     [facility] aware of the above. Please notify [facility] when patient is ready to leave the unit at discharge. (Call [phone] or [phone] after 5pm.) [facility] information and contact numbers given to wife.       A Please do not hesitate to call with questions.   Thank you,   Elsie Saas, RN, [facility]      [place] [facility] Liaison   [phone]

## 2021-03-03 NOTE — Progress Notes (Signed)
Daily Progress Note   Patient Name: Tyler Carr       Date: 03/03/2021 DOB: November 02, 1930  Age: 88 y.o. MRN#: [id number] Attending Physician: Zannie Cove, MD Primary Care Physician: [facility] Admit Date: 02/27/2021  Reason for Consultation/Follow-up: Establishing goals of care  Patient Profile/HPI:  88 y.o. male  with past medical history of alzheimer's dementia, CKD, a fib admitted on 02/27/2021 with worsening shortness of breath and edema. Workup revealed bilateral pleural effusions, acute on chronic CHF, aspiration pneumonia. He has been treated with antibiotics and aggressive diuresis. Cr is trending up. Admission has been complicated with some delirium. Palliative medicine consulted for GOC.    Subjective: Chart reviewed- Noted that after discussion with attending provider this morning, [name] has decided to take patient home with Hospice.  Patient awake, has mitts on.  [name] shares her goals are to have patient home where he can hear his family members voices.  She is reviewing Hospice agencies and has a call into a friend to get their advice on which one she should choose.   Review of Systems  Unable to perform ROS: Dementia    Physical Exam Vitals and nursing note reviewed.  Constitutional:      Comments: frail  Pulmonary:     Effort: Pulmonary effort is normal.  Neurological:     Mental Status: He is disoriented.            Vital Signs: BP (!) 160/93 (BP Location: Left Arm)    Pulse 83    Temp 97.8 F (36.6 C) (Oral)    Resp 20    Ht 5\' 10"  (1.778 m)    Wt 66.9 kg    SpO2 100%    BMI 21.16 kg/m  SpO2: SpO2: 100 % O2 Device: O2 Device: Nasal Cannula O2 Flow Rate: O2 Flow Rate (L/min): 3 L/min  Intake/output summary:  Intake/Output Summary (Last 24  hours) at 03/03/2021 1407 Last data filed at 03/03/2021 1033 Gross per 24 hour  Intake 210.86 ml  Output 1300 ml  Net -1089.14 ml   LBM: Last BM Date : 02/27/21 Baseline Weight: Weight: 66.6 kg Most recent weight: Weight: 66.9 kg       Palliative Assessment/Data: PPS: 30%      Patient Active Problem List   Diagnosis Date Noted   Aspiration  pneumonia (HCC) 03/02/2021   Acute urinary retention 03/02/2021   Delirium 03/02/2021   Dementia with behavioral disturbance 02/28/2021   Permanent atrial fibrillation (HCC) 02/28/2021   Acute kidney injury superimposed on chronic kidney disease (HCC) 02/28/2021   Pulmonary infiltrates 02/28/2021   Acute CHF (congestive heart failure) (HCC) 02/27/2021   Acute on chronic combined systolic and diastolic CHF (congestive heart failure) (HCC)    AKI (acute kidney injury) (HCC)    Vascular dementia without behavioral disturbance (HCC)    Cardiac arrest (HCC) 05/11/2018   Drug overdose, accidental or unintentional, initial encounter 05/09/2018   Hypotension 05/09/2018   Accelerated hypertension    Symptomatic bradycardia 01/30/2018   CHF (congestive heart failure) (HCC)    Hypertension    Hyperlipidemia    Atrial fibrillation with slow ventricular response (HCC)     Palliative Care Assessment & Plan    Assessment/Recommendations/Plan  CHF exacerbation in the setting of pnuemonia, AKI on CKD- plan now to discharge home with Hospice   Code Status: DNR  Prognosis:  < 12 months  Discharge Planning: Home with Hospice  Care plan was discussed with patient's spouse.   Thank you for allowing the Palliative Medicine Team to assist in the care of this patient.  Ocie Bob, AGNP-C Palliative Medicine   Please contact Palliative Medicine Team phone at [phone] for questions and concerns.

## 2021-03-03 NOTE — TOC Progression Note (Addendum)
Transition of Care [facility]) - Progression Note    Patient Details  Name: Tyler Carr MRN: [id number] Date of Birth: 12-26-1930  [facility] CM/SW Contact  Leone Haven, RN Phone Number: 03/03/2021, 12:49 PM  Clinical Narrative:    NCM spoke with wife, she states yes she wants home with hospice, NCM went over Medicare.gov list with her for hospice agencies.  Wife states she will call me back after she checks with somone else about which Hospice to choose for him.  She states he goes to [facility] [place]. NCM received call from wife, she states they want to go with [facility].  NCM made referral to [facility] with [facility]. Patient has bsc, w/chair and grab bars at home, wife states she does not think she wants a hospital bed, he will need ambulance transport at dc. Address confirmed. NCM informed wife Marcell Anger will be contacting her.  Wife now wants hospital bed. Macon Large will order the DME .         Expected Discharge Plan and Services                                                 Social Determinants of Health (SDOH) Interventions    Readmission Risk Interventions No flowsheet data found.

## 2021-03-03 NOTE — Progress Notes (Signed)
CCMD notified RN that patient had 3.29 second pause. MD notified. Evening coreg held per MD.

## 2021-03-03 NOTE — Progress Notes (Signed)
Pharmacy Antibiotic Note  Tyler Carr is a 88 y.o. male admitted on 02/27/2021. Pharmacy has been consulted for Unasyn dosing for aspiration pneumonia. Cr improved slightly, WBC remains elevated.  Plan: Adjust Unasyn to 3g IV q6h Follow LOT, Cr  Height: 5\' 10"  (177.8 cm) Weight: 66.9 kg (147 lb 7.8 oz) IBW/kg (Calculated) : 73  Temp (24hrs), Avg:98.1 F (36.7 C), Min:98 F (36.7 C), Max:98.2 F (36.8 C)  Recent Labs  Lab 02/27/21 1425 02/28/21 0414 03/01/21 0437 03/02/21 0506 03/03/21 0420  WBC 7.5 8.4 9.7 10.8* 11.5*  CREATININE 1.27* 1.31* 1.66* 1.69* 1.56*     Estimated Creatinine Clearance: 31 mL/min (A) (by C-G formula based on SCr of 1.56 mg/dL (H)).    No Known Allergies  Antimicrobials this admission: Unasyn 2/14 >>   Microbiology results: N/A  Thank you for allowing pharmacy to be a part of this patients care.  3/14, PharmD, BCPS, [facility] Clinical Pharmacist [phone] Please check AMION for all [facility] Pharmacy numbers 03/03/2021

## 2021-03-04 ENCOUNTER — Inpatient Hospital Stay (HOSPITAL_COMMUNITY): Payer: No Typology Code available for payment source

## 2021-03-04 DIAGNOSIS — R319 Hematuria, unspecified: Secondary | ICD-10-CM | POA: Diagnosis present

## 2021-03-04 LAB — CBC
HCT: 42.6 % (ref 39.0–52.0)
HCT: 48.5 % (ref 39.0–52.0)
Hemoglobin: 14.4 g/dL (ref 13.0–17.0)
Hemoglobin: 16 g/dL (ref 13.0–17.0)
MCH: 32.3 pg (ref 26.0–34.0)
MCH: 32.4 pg (ref 26.0–34.0)
MCHC: 33.8 g/dL (ref 30.0–36.0)
MCHC: 33 g/dL (ref 30.0–36.0)
MCV: 95.5 fL (ref 80.0–100.0)
MCV: 98.2 fL (ref 80.0–100.0)
Platelets: 224 10*3/uL (ref 150–400)
Platelets: 246 10*3/uL (ref 150–400)
RBC: 4.46 MIL/uL (ref 4.22–5.81)
RBC: 4.94 MIL/uL (ref 4.22–5.81)
RDW: 14.5 % (ref 11.5–15.5)
RDW: 14.6 % (ref 11.5–15.5)
WBC: 10.2 10*3/uL (ref 4.0–10.5)
WBC: 14.8 10*3/uL — ABNORMAL HIGH (ref 4.0–10.5)
nRBC: 0 % (ref 0.0–0.2)
nRBC: 0 % (ref 0.0–0.2)

## 2021-03-04 LAB — BLOOD GAS, VENOUS
Acid-Base Excess: 6.5 mmol/L — ABNORMAL HIGH (ref 0.0–2.0)
Bicarbonate: 30.5 mmol/L — ABNORMAL HIGH (ref 20.0–28.0)
Drawn by: 1679
O2 Saturation: 63.3 %
Patient temperature: 37.4
pCO2, Ven: 41 mmHg — ABNORMAL LOW (ref 44–60)
pH, Ven: 7.48 — ABNORMAL HIGH (ref 7.25–7.43)
pO2, Ven: 41 mmHg (ref 32–45)

## 2021-03-04 LAB — BASIC METABOLIC PANEL
Anion gap: 11 (ref 5–15)
BUN: 44 mg/dL — ABNORMAL HIGH (ref 8–23)
CO2: 26 mmol/L (ref 22–32)
Calcium: 8.7 mg/dL — ABNORMAL LOW (ref 8.9–10.3)
Chloride: 111 mmol/L (ref 98–111)
Creatinine, Ser: 1.31 mg/dL — ABNORMAL HIGH (ref 0.61–1.24)
GFR, Estimated: 52 mL/min — ABNORMAL LOW (ref >60)
Glucose, Bld: 107 mg/dL — ABNORMAL HIGH (ref 70–99)
Potassium: 3.4 mmol/L — ABNORMAL LOW (ref 3.5–5.1)
Sodium: 148 mmol/L — ABNORMAL HIGH (ref 135–145)

## 2021-03-04 LAB — PROCALCITONIN: Procalcitonin: 0.91 ng/mL

## 2021-03-04 MED ORDER — SODIUM CHLORIDE 0.9 % IV SOLN
INTRAVENOUS | Status: DC | PRN
  Administered 2021-03-04: 10 mL via INTRAVENOUS

## 2021-03-04 MED ORDER — FOOD THICKENER (SIMPLYTHICK): 1.0000 | ORAL | Status: DC | PRN

## 2021-03-04 MED ORDER — FUROSEMIDE 40 MG PO TABS
40.0000 mg | ORAL_TABLET | Freq: Every day | ORAL | Status: DC
Start: 2021-03-04 — End: 2021-03-04
  Administered 2021-03-04: 40 mg via ORAL
  Filled 2021-03-04: qty 1

## 2021-03-04 MED ORDER — APIXABAN 5 MG PO TABS: 5.0000 mg | ORAL_TABLET | Freq: Two times a day (BID) | ORAL | Status: DC

## 2021-03-04 MED ORDER — POTASSIUM CHLORIDE 20 MEQ PO PACK
40.0000 meq | PACK | Freq: Two times a day (BID) | ORAL | Status: DC
  Administered 2021-03-04: 40 meq via ORAL
  Filled 2021-03-04: qty 2

## 2021-03-04 MED ORDER — POTASSIUM CHLORIDE CRYS ER 20 MEQ PO TBCR: 40.0000 meq | EXTENDED_RELEASE_TABLET | Freq: Once | ORAL | Status: DC

## 2021-03-04 MED ORDER — APIXABAN 2.5 MG PO TABS
2.5000 mg | ORAL_TABLET | Freq: Two times a day (BID) | ORAL | Status: DC
Start: 2021-03-05 — End: 2021-03-04

## 2021-03-04 MED ORDER — LEVALBUTEROL HCL 0.63 MG/3ML IN NEBU: 0.6300 mg | INHALATION_SOLUTION | Freq: Four times a day (QID) | RESPIRATORY_TRACT | Status: DC | PRN

## 2021-03-04 MED ORDER — SODIUM CHLORIDE 0.9 % IV SOLN
3.0000 g | Freq: Three times a day (TID) | INTRAVENOUS | Status: DC
  Administered 2021-03-04 – 2021-03-05 (×2): 3 g via INTRAVENOUS
  Filled 2021-03-04 (×3): qty 8

## 2021-03-04 MED ORDER — ENOXAPARIN SODIUM 60 MG/0.6ML IJ SOSY
60.0000 mg | PREFILLED_SYRINGE | Freq: Two times a day (BID) | INTRAMUSCULAR | Status: DC
  Administered 2021-03-05: 60 mg via SUBCUTANEOUS
  Filled 2021-03-04: qty 0.6

## 2021-03-04 NOTE — Assessment & Plan Note (Signed)
Secondary to retention, Foley catheter placement, patient was tugging at catheter yesterday -Will hold Eliquis today, monitor

## 2021-03-04 NOTE — Plan of Care (Signed)
°  Problem: Cardiac: Goal: Ability to achieve and maintain adequate cardiopulmonary perfusion will improve Outcome: Progressing   Problem: Safety: Goal: Ability to remain free from injury will improve Outcome: Progressing   Problem: Skin Integrity: Goal: Risk for impaired skin integrity will decrease Outcome: Progressing

## 2021-03-04 NOTE — Plan of Care (Signed)
  Problem: Cardiac: Goal: Ability to achieve and maintain adequate cardiopulmonary perfusion will improve Outcome: Progressing   

## 2021-03-04 NOTE — Progress Notes (Signed)
PROGRESS NOTE    Tyler Carr  [id number] DOB: 12-07-1930 DOA: 02/27/2021 PCP: [facility]  Brief Narrative:89/M with history of dementia, chronic systolic CHF, chronic A-fib on apixaban was brought to the ED by his wife due to worsening dyspnea and edema X 7 days, in the ED creatinine was 1.2, BNP> 4500, chest x-ray noted bilateral pleural effusion right> left,?  Loculation and upper and lower lobe interstitial infiltrates. -Hospitalization complicated by agitation, delirium requiring Haldol and Ativan -Treated with antibiotics for aspiration pneumonia and diuretics, this was complicated by hypernatremia and AKI, diuretics subsequently held and resumed slowly -Seen by palliative care this admission as well, goal for hospice services  Subjective: -Poor oral intake, less agitated when wife is at bedside -Breathing okay overall, occasional cough, oral intake remains poor  Assessment and Plan: * Acute on chronic combined systolic and diastolic CHF (congestive heart failure) ([place])- (present on admission) -Echocardiogram with LV EF 35 to 40% with global hypokinesis, positive LVH, moderate reduction in RV systolic function, RSVP 54, severe tricuspid regurgitation, aortic valve with moderate to severe regurgitation -Diuresed with IV Lasix on admission, unfortunately creatinine trended up and sodium went to 151 -Sodium, kidney function stabilizing, Lasix p.o. resumed -Overall prognosis is poor in the setting of dementia, CHF, cor pulmonale, valvular heart disease -Palliative care consulted for goals of care, discussed poor prognosis and ongoing failure to thrive with advanced age, progressive dementia and multiple medical problems, spouse agreeable to take him home on Monday with hospice services when medically optimized  Aspiration pneumonia ([place]) -Suspected, pulmonary infiltrates noted on imaging  -Diuretics as above, continue IV Unasyn day 4, transition to oral antibiotics  tomorrow -SLP evaluation completed, no significant dysphagia noted  Acute kidney injury superimposed on chronic kidney disease ([place])- (present on admission) CKD stage 3a/ hypernatremia -Creatinine has slowly trended up from 1.2-1.3 and trended up to 1.6 with hyponatremia -Held Lasix and Entresto -Now kidney function is stable, restarted p.o. Lasix  Permanent atrial fibrillation ([place])- (present on admission) - Continue carvedilol, hold Eliquis today with hematuria  Dementia with behavioral disturbance- (present on admission) - Concern for progressive dementia, significant agitation this admission especially in the evenings after wife leaves -Continue Aricept and Namenda, as needed Haldol  Hematuria- (present on admission) Secondary to retention, Foley catheter placement, patient was tugging at catheter yesterday -Will hold Eliquis today, monitor  Acute urinary retention- (present on admission) - Started on Flomax, unfortunately had to have urinary catheter placed yesterday for recurrent retention  Hyperlipidemia- (present on admission) Continue with statin therapy.    DVT prophylaxis: Apixaban Code Status: DNR Family Communication: Discussed with wife at bedside Disposition Plan: Home with hospice services on Monday  Consultants:  Palliative care  Procedures:   Antimicrobials:    Objective: Vitals:   03/03/21 2100 03/04/21 0451 03/04/21 0924 03/04/21 1029  BP: (!) 156/90 (!) 149/96 (!) 144/73 (!) 145/81  Pulse:   72 77  Resp:    20  Temp: 98.8 F (37.1 C) 97.6 F (36.4 C)  98.1 F (36.7 C)  TempSrc: Axillary Axillary  Axillary  SpO2: 96%     Weight:  64.6 kg    Height:        Intake/Output Summary (Last 24 hours) at 03/04/2021 1131 Last data filed at 03/04/2021 1033 Gross per 24 hour  Intake 232.83 ml  Output 1000 ml  Net -767.17 ml   Filed Weights   03/02/21 0228 03/03/21 0451 03/04/21 0451  Weight: 67 kg 66.9 kg 64.6 kg  Examination:  General  exam: Elderly chronically ill male sitting up in bed, somnolent easily arousable, oriented to self only, considerable cognitive deficits HEENT: No JVD CVS: S1-S2, regular rate rhythm Lungs: Rare basilar rales, improved air movements Abdomen: Soft, nontender, bowel sounds present Extremities: No edema  Skin: No rashes Psychiatry: confused, very poor insight and judgment    Data Reviewed:   CBC: Recent Labs  Lab 02/28/21 0414 03/01/21 0437 03/02/21 0506 03/03/21 0420 03/04/21 0350  WBC 8.4 9.7 10.8* 11.5* 10.2  HGB 13.3 12.7* 14.8 15.1 14.4  HCT 40.6 38.5* 43.4 45.5 42.6  MCV 98.8 97.2 95.2 96.4 95.5  PLT 195 179 227 249 [id number]   Basic Metabolic Panel: Recent Labs  Lab 02/27/21 1425 02/28/21 0414 03/01/21 0437 03/02/21 0506 03/03/21 0420 03/04/21 0350  NA 146* 147* 151* 150* 149* 148*  K 3.9 3.5 4.2 3.3* 3.5 3.4*  CL 110 110 114* 112* 110 111  CO2 23 21* 22 23 28 26   GLUCOSE 111* 153* 108* 111* 123* 107*  BUN 31* 30* 46* 55* 53* 44*  CREATININE 1.27* 1.31* 1.66* 1.69* 1.56* 1.31*  CALCIUM 9.3 8.9 8.8* 9.0 9.0 8.7*  MG 2.4  --   --  2.5*  --   --    GFR: Estimated Creatinine Clearance: 35.6 mL/min (A) (by C-G formula based on SCr of 1.31 mg/dL (H)). Liver Function Tests: No results for input(s): AST, ALT, ALKPHOS, BILITOT, PROT, ALBUMIN in the last 168 hours. No results for input(s): LIPASE, AMYLASE in the last 168 hours. No results for input(s): AMMONIA in the last 168 hours. Coagulation Profile: No results for input(s): INR, PROTIME in the last 168 hours. Cardiac Enzymes: No results for input(s): CKTOTAL, CKMB, CKMBINDEX, TROPONINI in the last 168 hours. BNP (last 3 results) No results for input(s): PROBNP in the last 8760 hours. HbA1C: No results for input(s): HGBA1C in the last 72 hours. CBG: No results for input(s): GLUCAP in the last 168 hours. Lipid Profile: No results for input(s): CHOL, HDL, LDLCALC, TRIG, CHOLHDL, LDLDIRECT in the last 72  hours. Thyroid Function Tests: No results for input(s): TSH, T4TOTAL, FREET4, T3FREE, THYROIDAB in the last 72 hours. Anemia Panel: No results for input(s): VITAMINB12, FOLATE, FERRITIN, TIBC, IRON, RETICCTPCT in the last 72 hours. Urine analysis:    Component Value Date/Time   COLORURINE YELLOW 05/10/2018 1709   APPEARANCEUR CLEAR 05/10/2018 1709   LABSPEC 1.012 05/10/2018 1709   PHURINE 5.0 05/10/2018 1709   GLUCOSEU NEGATIVE 05/10/2018 1709   HGBUR NEGATIVE 05/10/2018 1709   BILIRUBINUR NEGATIVE 05/10/2018 1709   KETONESUR NEGATIVE 05/10/2018 1709   PROTEINUR NEGATIVE 05/10/2018 1709   NITRITE NEGATIVE 05/10/2018 1709   LEUKOCYTESUR NEGATIVE 05/10/2018 1709   Sepsis Labs: @LABRCNTIP (procalcitonin:4,lacticidven:4)  ) Recent Results (from the past 240 hour(s))  Resp Panel by RT-PCR (Flu A&B, Covid) Nasopharyngeal Swab     Status: None   Collection Time: 02/28/21  1:14 PM   Specimen: Nasopharyngeal Swab; Nasopharyngeal(NP) swabs in vial transport medium  Result Value Ref Range Status   SARS Coronavirus 2 by RT PCR NEGATIVE NEGATIVE Final    Comment: (NOTE) SARS-CoV-2 target nucleic acids are NOT DETECTED.  The SARS-CoV-2 RNA is generally detectable in upper respiratory specimens during the acute phase of infection. The lowest concentration of SARS-CoV-2 viral copies this assay can detect is 138 copies/mL. A negative result does not preclude SARS-Cov-2 infection and should not be used as the sole basis for treatment or other patient management decisions. A negative result  may occur with  improper specimen collection/handling, submission of specimen other than nasopharyngeal swab, presence of viral mutation(s) within the areas targeted by this assay, and inadequate number of viral copies(<138 copies/mL). A negative result must be combined with clinical observations, patient history, and epidemiological information. The expected result is Negative.  Fact Sheet for Patients:   EntrepreneurPulse.com.au  Fact Sheet for Healthcare Providers:  IncredibleEmployment.be  This test is no t yet approved or cleared by the [place] FDA and  has been authorized for detection and/or diagnosis of SARS-CoV-2 by FDA under an Emergency Use Authorization (EUA). This EUA will remain  in effect (meaning this test can be used) for the duration of the COVID-19 declaration under Section 564(b)(1) of the Act, 21 U.S.C.section 360bbb-3(b)(1), unless the authorization is terminated  or revoked sooner.       Influenza A by PCR NEGATIVE NEGATIVE Final   Influenza B by PCR NEGATIVE NEGATIVE Final    Comment: (NOTE) The Xpert Xpress SARS-CoV-2/FLU/RSV plus assay is intended as an aid in the diagnosis of influenza from Nasopharyngeal swab specimens and should not be used as a sole basis for treatment. Nasal washings and aspirates are unacceptable for Xpert Xpress SARS-CoV-2/FLU/RSV testing.  Fact Sheet for Patients: EntrepreneurPulse.com.au  Fact Sheet for Healthcare Providers: IncredibleEmployment.be  This test is not yet approved or cleared by the [place] FDA and has been authorized for detection and/or diagnosis of SARS-CoV-2 by FDA under an Emergency Use Authorization (EUA). This EUA will remain in effect (meaning this test can be used) for the duration of the COVID-19 declaration under Section 564(b)(1) of the Act, 21 U.S.C. section 360bbb-3(b)(1), unless the authorization is terminated or revoked.  Performed at [facility], [place] [address] [place], [place] [address]      Radiology Studies: No results found.   Scheduled Meds:  amoxicillin-clavulanate  1 tablet Oral Q12H   [START ON 03/05/2021] apixaban  5 mg Oral BID   atorvastatin  40 mg Oral QPM   brimonidine  1 drop Both Eyes BID   carvedilol  3.125 mg Oral BID WC   Chlorhexidine Gluconate Cloth  6 each Topical Daily    donepezil  5 mg Oral BID   dorzolamide-timolol  1 drop Both Eyes BID   furosemide  40 mg Oral Daily   memantine  10 mg Oral BID   potassium chloride  40 mEq Oral BID   tamsulosin  0.4 mg Oral Daily   Continuous Infusions:     LOS: 5 days    Time spent: 66min    Domenic Polite, MD [facility]   03/04/2021, 11:31 AM

## 2021-03-04 NOTE — Significant Event (Addendum)
TRH night coverage note:  Pt seen and evaluated at bedside due to tachypnea with RR 45-50.  Per RN, concerned that he may have aspirated.  RRT is performing NT suctioning.  O2 sat actually looks okay: 95% on just 4L.  Poor air movement.  Wife at bedside.  Have confirmed DNR/DNI status.  Plan:  Will see if there is anything rapidly reversible. 1) making Pt NPO for the moment given concerns of aspiration 2) checking CXR stat 3) checking VBG and BNP stat 4) Ordering PRN xopenex neb to see if this helps at all 5) pt not alert enough to attempt NIPPV  Update: CXR showing marked severity suprahilar airspace disease in L lung, much worse than 2/15.  My conclusion at this point is that patient likely just aspirated.  Plan: 1) trying to convert much of his PO meds to IV at this point; DC all POs  A) Converting augmentin to unasyn  B) converting eliquis to lovenox [place] per pharm  C) holding PO lasix for the moment, Holding off on starting IV lasix, but will hold off on starting any IVF for the moment as well.  Will see what his BNP comes back as.

## 2021-03-04 NOTE — Progress Notes (Signed)
Manufacturing engineer [facility]) Hospital Liaison  MSW contacted patient wife/Barbara to confirm DME delivery. Pamala Hurry reports that DME has not been delivered as she has not had the change to move her current furniture. MSW voiced understanding. [place] team notified of expected DC date.  TOC/MD notified of above updates.   A Please do not hesitate to call with questions.    Thank you,   Daphene Calamity, MSW     [place]

## 2021-03-04 NOTE — Plan of Care (Signed)
  Problem: Education: Goal: Ability to demonstrate management of disease process will improve Outcome: Progressing   Problem: Education: Goal: Ability to verbalize understanding of medication therapies will improve Outcome: Progressing   Problem: Education: Goal: Individualized Educational Video(s) Outcome: Progressing   Problem: Activity: Goal: Capacity to carry out activities will improve Outcome: Progressing   Problem: Cardiac: Goal: Ability to achieve and maintain adequate cardiopulmonary perfusion will improve Outcome: Progressing   

## 2021-03-04 NOTE — Progress Notes (Signed)
ANTICOAGULATION + ANTIBIOTIC CONSULT NOTE - Follow Up Consult  Pharmacy Consult for Lovenox and Unasyn Indication: atrial fibrillation and aspiration pneumonia  No Known Allergies  Patient Measurements: Height: 5\' 10"  (177.8 cm) Weight: 64.6 kg (142 lb 6.7 oz) IBW/kg (Calculated) : 73  Vital Signs: Temp: 98.3 F (36.8 C) (02/18 2122) Temp Source: Axillary (02/18 2122) BP: 145/99 (02/18 1949) Pulse Rate: 96 (02/18 1949)  Labs: Recent Labs    03/02/21 0506 03/03/21 0420 03/04/21 0350  HGB 14.8 15.1 14.4  HCT 43.4 45.5 42.6  PLT 227 249 224  CREATININE 1.69* 1.56* 1.31*    Estimated Creatinine Clearance: 35.6 mL/min (A) (by C-G formula based on SCr of 1.31 mg/dL (H)).  Assessment:  88 yr old male on Eliquis 5 mg BID PTA for atrial fibrillation.   Eliquis dose reduced to 2.5 mg BID 2/14-2/16 while creatinine was >1.5. Held today due to hematuria, noted to have been tugging at foley. Eliquis was to resume on 2/19 with PTA dose of 5 mg BID with improved creatinine.  Weight has trended down ~5 lbs over the last few days. CBC stable.   Day # 5 antibiotics for aspiration pneumonia.  Changed from Unasyn to Augmentin this morning and 1 dose was given.  Changing back to Unasyn tonight due to NPO due to aspiration.  Afebrile, WBC up to 14.8 tonight.  Goal of Therapy:  Appropriate Lovenox and Unasyn regimens for indications and renal function Monitor platelets by anticoagulation protocol: Yes   Plan:  Lovenox 60 mg SQ q12h to begin 2/19 am. Monitor for hematuria; intermittent CBC.  Unasyn 3gm IV q8hrs.  Monitor renal function for any need to adjust regimens.  Arty Baumgartner, [place] 03/04/2021,9:45 PM

## 2021-03-05 DIAGNOSIS — J690 Pneumonitis due to inhalation of food and vomit: Secondary | ICD-10-CM

## 2021-03-05 LAB — BASIC METABOLIC PANEL
Anion gap: 15 (ref 5–15)
BUN: 47 mg/dL — ABNORMAL HIGH (ref 8–23)
CO2: 24 mmol/L (ref 22–32)
Calcium: 9 mg/dL (ref 8.9–10.3)
Chloride: 111 mmol/L (ref 98–111)
Creatinine, Ser: 1.44 mg/dL — ABNORMAL HIGH (ref 0.61–1.24)
GFR, Estimated: 47 mL/min — ABNORMAL LOW (ref >60)
Glucose, Bld: 118 mg/dL — ABNORMAL HIGH (ref 70–99)
Potassium: 4.4 mmol/L (ref 3.5–5.1)
Sodium: 150 mmol/L — ABNORMAL HIGH (ref 135–145)

## 2021-03-05 LAB — BRAIN NATRIURETIC PEPTIDE: B Natriuretic Peptide: >4500 pg/mL — ABNORMAL HIGH (ref 0.0–100.0)

## 2021-03-05 MED ORDER — LORAZEPAM 2 MG/ML PO CONC: 1.0000 mg | ORAL | Status: DC | PRN

## 2021-03-05 MED ORDER — GLYCOPYRROLATE 0.2 MG/ML IJ SOLN
0.2000 mg | INTRAMUSCULAR | Status: DC | PRN
Start: 2021-03-05 — End: 2021-03-06

## 2021-03-05 MED ORDER — MORPHINE SULFATE (PF) 2 MG/ML IV SOLN: 2.0000 mg | INTRAVENOUS | Status: DC | PRN

## 2021-03-05 MED ORDER — LORAZEPAM 1 MG PO TABS: 1.0000 mg | ORAL_TABLET | ORAL | Status: DC | PRN

## 2021-03-05 MED ORDER — HALOPERIDOL 0.5 MG PO TABS
0.5000 mg | ORAL_TABLET | ORAL | Status: DC | PRN
  Filled 2021-03-05: qty 1

## 2021-03-05 MED ORDER — HALOPERIDOL LACTATE 2 MG/ML PO CONC
0.5000 mg | ORAL | Status: DC | PRN
  Filled 2021-03-05: qty 0.3

## 2021-03-05 MED ORDER — LORAZEPAM 2 MG/ML IJ SOLN: 1.0000 mg | INTRAMUSCULAR | Status: DC | PRN

## 2021-03-05 MED ORDER — MORPHINE SULFATE (PF) 2 MG/ML IV SOLN
1.0000 mg | INTRAVENOUS | Status: DC
  Administered 2021-03-05 – 2021-03-06 (×7): 1 mg via INTRAVENOUS
  Filled 2021-03-05 (×7): qty 1

## 2021-03-05 MED ORDER — HALOPERIDOL LACTATE 5 MG/ML IJ SOLN: 0.5000 mg | INTRAMUSCULAR | Status: DC | PRN

## 2021-03-05 NOTE — Significant Event (Addendum)
WBC slightly elevated, procalcitonin also slightly elevated.  CXR highly suspicious for PNA findings with LUL airspace disease.  Doesn't look like the total lung pulmonary edema he had on admission.  BNP is still > 4500 just as it was on admission.  Peripheral edema is still 1-2+ but does have some wrinkling in BLE suggesting hes overall net negative (daily weights would also seem to confirm this with down trending overall weight in past several days).  Considered CT chest non-con to try and get a better picture of lung pathology and differentiate pulm edema from PNA, but am informed that pt resting comfortably by RN for the moment, and wife would like him to get some sleep.  Therefore will defer for now and let day team decide if they want this.  Will get EKG -> isolated Q waves in lead III not present on prior EKGs this admit, however they WERE present on EKGs back in 2021.  Will hold off on ordering trops for the moment -> pt already on lovenox, I doubt very much that cards will want to take patient with AKI, who is DNI/DNR and planned on starting hospice on Monday for LHC.

## 2021-03-05 NOTE — Progress Notes (Signed)
Daily Progress Note   Patient Name: Tyler Carr       Date: 03/05/2021 DOB: 03/17/1930  Age: 88 y.o. MRN#: [id number] Attending Physician: Domenic Polite, MD Primary Care Physician: [facility] Admit Date: 02/27/2021  Reason for Consultation/Follow-up: Establishing goals of care  Patient Profile/HPI:  88 y.o. male  with past medical history of alzheimer's dementia, CKD, a fib admitted on 02/27/2021 with worsening shortness of breath and edema. Workup revealed bilateral pleural effusions, acute on chronic CHF, aspiration pneumonia. He has been treated with antibiotics and aggressive diuresis. Cr is trending up. BNP >4500.  Admission has been complicated with some delirium. Palliative medicine consulted for [place].    Subjective: Chart reviewed including progress notes, labs and imaging. WBC is rising again. PCT is elevated. Chest xray shows worsening disease.  Rapid response event last night- concern for another aspiration event.  He is less responsive today. He isn't eating or drinking.  Unfortunately, he condition is worsening.  Discussed transition to full comfort measures with Pamala Hurry. She is interested in residential hospice.  Emotional support given.     Review of Systems  Unable to perform ROS: Dementia    Physical Exam Vitals and nursing note reviewed.  Constitutional:      Comments: frail  Pulmonary:     Effort: Pulmonary effort is normal.  Neurological:     Mental Status: He is disoriented.            Vital Signs: BP 115/77    Pulse 96    Temp 98.6 F (37 C) (Axillary)    Resp (!) 24    Ht 5\' 10"  (1.778 m)    Wt 65.8 kg    SpO2 99%    BMI 20.81 kg/m  SpO2: SpO2: 99 % O2 Device: O2 Device: Nasal Cannula O2 Flow Rate: O2 Flow Rate (L/min): 4  L/min  Intake/output summary:  Intake/Output Summary (Last 24 hours) at 03/05/2021 1006 Last data filed at 03/05/2021 0900 Gross per 24 hour  Intake 294.12 ml  Output 1165 ml  Net -870.88 ml    LBM: Last BM Date : 02/27/21 Baseline Weight: Weight: 66.6 kg Most recent weight: Weight: 65.8 kg       Palliative Assessment/Data: PPS: 20%      Patient Active Problem List   Diagnosis Date Noted  Hematuria 03/04/2021   Aspiration pneumonia ([employer]) 03/02/2021   Acute urinary retention 03/02/2021   Delirium 03/02/2021   Dementia with behavioral disturbance 02/28/2021   Permanent atrial fibrillation ([place]) 02/28/2021   Acute kidney injury superimposed on chronic kidney disease ([place]) 02/28/2021   Pulmonary infiltrates 02/28/2021   Acute CHF (congestive heart failure) ([place]) 02/27/2021   Acute on chronic combined systolic and diastolic CHF (congestive heart failure) ([place])    AKI (acute kidney injury) ([place])    Vascular dementia without behavioral disturbance ([place])    Cardiac arrest ([place]) 05/11/2018   Drug overdose, accidental or unintentional, initial encounter 05/09/2018   Hypotension 05/09/2018   Accelerated hypertension    Symptomatic bradycardia 01/30/2018   CHF (congestive heart failure) ([facility])    Hypertension    Hyperlipidemia    Atrial fibrillation with slow ventricular response ([place])     Palliative Care Assessment & Plan    Assessment/Recommendations/Plan  Aspiration pneumonia in the setting of advancing dementia- not eating or drinking now- plan is for comfort measures only and supporting through EOL with dignity Refer for inpatient hospice eval Symptom management:  Morphine 1 mg q4hrs IV scheduled Morphine 2mg  q15 min as needed for SOB, dyspnea, RR>26 Lorazepam 0.5mg  IV q4hr as needed for anxiety Haldol 0.5mg  IV q4 hours as needed for agitation or terminal delerium Robinul .2mg  IV q2hr prn for secretions D/C all other meds, tests, interventions  that do not provide comfort     Code Status: DNR  Prognosis:  < 2 weeks  Discharge Planning: Hospice facility  Care plan was discussed with patient's spouse.   Thank you for allowing the Palliative Medicine Team to assist in the care of this patient.  Mariana Kaufman, AGNP-C Palliative Medicine   Please contact Palliative Medicine Team phone at [phone] for questions and concerns.

## 2021-03-05 NOTE — Progress Notes (Signed)
[facility]) Hospital Liaison Note  Referral received for evaluation for [facility].   Hospice eligibility confirmed.   Unfortunately, [facility] is unable to offer a room today. Hospital Liaison will follow up tomorrow or sooner if a room becomes available. Please do not hesitate to call with questions.   [place] [facility] Liaison [phone]

## 2021-03-05 NOTE — Progress Notes (Signed)
PROGRESS NOTE    Tyler Carr  [id number] DOB: 09-Dec-1930 DOA: 02/27/2021 PCP: [facility]  Brief Narrative:89/M with history of dementia, chronic systolic CHF, chronic A-fib on apixaban was brought to the ED by his wife due to worsening dyspnea and edema X 7 days, in the ED creatinine was 1.2, BNP> 4500, chest x-ray noted bilateral pleural effusion right> left,?  Loculation and upper and lower lobe interstitial infiltrates. -Hospitalization complicated by agitation, delirium requiring Haldol and Ativan -Treated with antibiotics for aspiration pneumonia and diuretics, this was complicated by hypernatremia and AKI, diuretics subsequently held and resumed slowly -Seen by palliative care this admission as well, goal for home hospice services -Overnight 2/9 had worsening respiratory distress, aspiration, repeat x-ray with worsening aspiration pneumonia, much more obtunded, poorly responsive  Subjective: -Worsened last night, aspirated, worsening respiratory distress and more obtunded, eating and drinking less  Assessment and Plan: * Acute on chronic combined systolic and diastolic CHF (congestive heart failure) ([place])- (present on admission) -Echocardiogram with LV EF 35 to 40% with global hypokinesis, positive LVH, moderate reduction in RV systolic function, RSVP 54, severe tricuspid regurgitation, aortic valve with moderate to severe regurgitation -Diuresed with IV Lasix on admission, unfortunately creatinine trended up and sodium went to 151 -Sodium, kidney function stabilizing, Lasix p.o. resumed -Overall prognosis is poor in the setting of dementia, CHF, cor pulmonale, valvular heart disease, now worsening aspiration pneumonia -Palliative care consulted for goals of care, discussed poor prognosis and ongoing failure to thrive with advanced age, progressive dementia and multiple medical problems, original plan was for home with hospice services, now residential hospice being  considered  Aspiration pneumonia ([place]) -Suspected, pulmonary infiltrates noted on imaging  -Diuretics as above, treated with IV Unasyn X 4 days -SLP evaluation completed -Overnight aspirated, developed worsening pneumonia, hypoxia, respiratory distress and encephalopathy -Now comfort care, plan for residential hospice  Acute kidney injury superimposed on chronic kidney disease ([place])- (present on admission) CKD stage 3a/ hypernatremia -Creatinine has slowly trended up from 1.2-1.3 and trended up to 1.6 with hyponatremia -Held Lasix and Entresto -Restarted on p.o. Lasix yesterday  Permanent atrial fibrillation ([place])- (present on admission) - Continue carvedilol, hold Eliquis today with hematuria  Dementia with behavioral disturbance- (present on admission) - Concern for progressive dementia, significant agitation this admission especially in the evenings after wife leaves -Continue Aricept and Namenda, as needed Haldol  Hematuria- (present on admission) Secondary to retention, Foley catheter placement, patient was tugging at catheter yesterday -Will hold Eliquis today, monitor  Acute urinary retention- (present on admission) - Started on Flomax, unfortunately had to have urinary catheter placed yesterday for recurrent retention  Hyperlipidemia- (present on admission) Continue with statin therapy.    DVT prophylaxis: Apixaban Code Status: DNR Family Communication: Discussed with wife at bedside Disposition Plan: Likely residential hospice  Consultants:  Palliative care  Procedures:   Antimicrobials:    Objective: Vitals:   03/05/21 0753 03/05/21 0812 03/05/21 0820 03/05/21 1031  BP: 115/77     Pulse:      Resp: (!) 44 18 (!) 24 14  Temp:      TempSrc:      SpO2: 98% 99% 99% 95%  Weight:      Height:        Intake/Output Summary (Last 24 hours) at 03/05/2021 1129 Last data filed at 03/05/2021 1000 Gross per 24 hour  Intake 304.11 ml  Output 890 ml  Net  -585.89 ml   Filed Weights   03/03/21 0451 03/04/21 0451  03/05/21 0400  Weight: 66.9 kg 64.6 kg 65.8 kg    Examination:  General exam: Elderly chronically ill male, laying in bed, minimally responsive, mumbles few words, in mild respiratory distress HEENT: Positive JVD CVS: S1-S2, regular rate rhythm Lungs: Bilateral rhonchi and conducted upper airway sounds Abdomen: Soft, nontender, bowel sounds present Extremities: No edema  Skin: No rashes Psychiatry: confused, obtunded, very poor insight and judgment    Data Reviewed:   CBC: Recent Labs  Lab 03/01/21 0437 03/02/21 0506 03/03/21 0420 03/04/21 0350 03/04/21 2135  WBC 9.7 10.8* 11.5* 10.2 14.8*  HGB 12.7* 14.8 15.1 14.4 16.0  HCT 38.5* 43.4 45.5 42.6 48.5  MCV 97.2 95.2 96.4 95.5 98.2  PLT 179 227 249 224 0000000   Basic Metabolic Panel: Recent Labs  Lab 02/27/21 1425 02/28/21 0414 03/01/21 0437 03/02/21 0506 03/03/21 0420 03/04/21 0350 03/05/21 0500  NA 146*   < > 151* 150* 149* 148* 150*  K 3.9   < > 4.2 3.3* 3.5 3.4* 4.4  CL 110   < > 114* 112* 110 111 111  CO2 23   < > 22 23 28 26 24   GLUCOSE 111*   < > 108* 111* 123* 107* 118*  BUN 31*   < > 46* 55* 53* 44* 47*  CREATININE 1.27*   < > 1.66* 1.69* 1.56* 1.31* 1.44*  CALCIUM 9.3   < > 8.8* 9.0 9.0 8.7* 9.0  MG 2.4  --   --  2.5*  --   --   --    < > = values in this interval not displayed.   GFR: Estimated Creatinine Clearance: 33 mL/min (A) (by C-G formula based on SCr of 1.44 mg/dL (H)). Liver Function Tests: No results for input(s): AST, ALT, ALKPHOS, BILITOT, PROT, ALBUMIN in the last 168 hours. No results for input(s): LIPASE, AMYLASE in the last 168 hours. No results for input(s): AMMONIA in the last 168 hours. Coagulation Profile: No results for input(s): INR, PROTIME in the last 168 hours. Cardiac Enzymes: No results for input(s): CKTOTAL, CKMB, CKMBINDEX, TROPONINI in the last 168 hours. BNP (last 3 results) No results for input(s): PROBNP  in the last 8760 hours. HbA1C: No results for input(s): HGBA1C in the last 72 hours. CBG: No results for input(s): GLUCAP in the last 168 hours. Lipid Profile: No results for input(s): CHOL, HDL, LDLCALC, TRIG, CHOLHDL, LDLDIRECT in the last 72 hours. Thyroid Function Tests: No results for input(s): TSH, T4TOTAL, FREET4, T3FREE, THYROIDAB in the last 72 hours. Anemia Panel: No results for input(s): VITAMINB12, FOLATE, FERRITIN, TIBC, IRON, RETICCTPCT in the last 72 hours. Urine analysis:    Component Value Date/Time   COLORURINE YELLOW 05/10/2018 1709   APPEARANCEUR CLEAR 05/10/2018 1709   LABSPEC 1.012 05/10/2018 1709   PHURINE 5.0 05/10/2018 1709   GLUCOSEU NEGATIVE 05/10/2018 1709   HGBUR NEGATIVE 05/10/2018 1709   BILIRUBINUR NEGATIVE 05/10/2018 1709   KETONESUR NEGATIVE 05/10/2018 1709   PROTEINUR NEGATIVE 05/10/2018 1709   NITRITE NEGATIVE 05/10/2018 1709   LEUKOCYTESUR NEGATIVE 05/10/2018 1709   Sepsis Labs: @LABRCNTIP (procalcitonin:4,lacticidven:4)  ) Recent Results (from the past 240 hour(s))  Resp Panel by RT-PCR (Flu A&B, Covid) Nasopharyngeal Swab     Status: None   Collection Time: 02/28/21  1:14 PM   Specimen: Nasopharyngeal Swab; Nasopharyngeal(NP) swabs in vial transport medium  Result Value Ref Range Status   SARS Coronavirus 2 by RT PCR NEGATIVE NEGATIVE Final    Comment: (NOTE) SARS-CoV-2 target nucleic acids are  NOT DETECTED.  The SARS-CoV-2 RNA is generally detectable in upper respiratory specimens during the acute phase of infection. The lowest concentration of SARS-CoV-2 viral copies this assay can detect is 138 copies/mL. A negative result does not preclude SARS-Cov-2 infection and should not be used as the sole basis for treatment or other patient management decisions. A negative result may occur with  improper specimen collection/handling, submission of specimen other than nasopharyngeal swab, presence of viral mutation(s) within the areas  targeted by this assay, and inadequate number of viral copies(<138 copies/mL). A negative result must be combined with clinical observations, patient history, and epidemiological information. The expected result is Negative.  Fact Sheet for Patients:  EntrepreneurPulse.com.au  Fact Sheet for Healthcare Providers:  IncredibleEmployment.be  This test is no t yet approved or cleared by the [place] FDA and  has been authorized for detection and/or diagnosis of SARS-CoV-2 by FDA under an Emergency Use Authorization (EUA). This EUA will remain  in effect (meaning this test can be used) for the duration of the COVID-19 declaration under Section 564(b)(1) of the Act, 21 U.S.C.section 360bbb-3(b)(1), unless the authorization is terminated  or revoked sooner.       Influenza A by PCR NEGATIVE NEGATIVE Final   Influenza B by PCR NEGATIVE NEGATIVE Final    Comment: (NOTE) The Xpert Xpress SARS-CoV-2/FLU/RSV plus assay is intended as an aid in the diagnosis of influenza from Nasopharyngeal swab specimens and should not be used as a sole basis for treatment. Nasal washings and aspirates are unacceptable for Xpert Xpress SARS-CoV-2/FLU/RSV testing.  Fact Sheet for Patients: EntrepreneurPulse.com.au  Fact Sheet for Healthcare Providers: IncredibleEmployment.be  This test is not yet approved or cleared by the [place] FDA and has been authorized for detection and/or diagnosis of SARS-CoV-2 by FDA under an Emergency Use Authorization (EUA). This EUA will remain in effect (meaning this test can be used) for the duration of the COVID-19 declaration under Section 564(b)(1) of the Act, 21 U.S.C. section 360bbb-3(b)(1), unless the authorization is terminated or revoked.  Performed at [facility], [place] [address] [place], [place] [address]      Radiology Studies: DG CHEST PORT 1 VIEW  Result Date:  03/04/2021 CLINICAL DATA:  Respiratory distress. EXAM: PORTABLE CHEST 1 VIEW COMPARISON:  March 01, 2021 FINDINGS: Marked severity airspace disease is seen within the suprahilar region of the left lung. This is increased in severity when compared to the prior study. Moderate severity areas of bibasilar atelectasis and/or infiltrate are also noted, left greater than right. There are small to moderate size bilateral pleural effusions, left greater than right. No pneumothorax is identified. The cardiac silhouette is moderately enlarged. The visualized skeletal structures are unremarkable. IMPRESSION: 1. Marked severity left upper lobe airspace disease, increased in severity when compared to the prior study. 2. Moderate severity bibasilar atelectasis and/or infiltrate, left greater than right. 3. Small to moderate size bilateral pleural effusions, left greater than right. Electronically Signed   By: Virgina Norfolk M.D.   On: 03/04/2021 20:54     Scheduled Meds:  brimonidine  1 drop Both Eyes BID    morphine injection  1 mg Intravenous Q4H   Continuous Infusions:     LOS: 6 days    Time spent: 32min    Domenic Polite, MD [facility]   03/05/2021, 11:29 AM

## 2021-03-06 MED ORDER — LORAZEPAM 1 MG PO TABS: 1.0000 mg | ORAL_TABLET | ORAL | 0 refills | Status: AC | PRN

## 2021-03-06 MED ORDER — MORPHINE SULFATE (CONCENTRATE) 20 MG/ML PO SOLN: 5.0000 mg | ORAL | 0 refills | Status: AC | PRN

## 2021-03-06 NOTE — Discharge Summary (Signed)
Physician Discharge Summary  Tyler Carr [id number] DOB: Sep 02, 1930 DOA: 02/27/2021  PCP: [facility]  Admit date: 02/27/2021 Discharge date: 03/06/2021  Time spent: 35 minutes  Recommendations for Outpatient Follow-up:  Residential hospice for comfort focused care  Discharge Diagnoses:  Principal Problem:   Acute on chronic combined systolic and diastolic CHF (congestive heart failure) (HCC) Severe tricuspid regurgitation Moderate to severe aortic regurgitation Pulmonary hypertension Encephalopathy Acute hypoxic respiratory failure   Aspiration pneumonia (HCC)   Acute kidney injury superimposed on chronic kidney disease ([place])   Permanent atrial fibrillation ([place])   Dementia with behavioral disturbance   Hyperlipidemia   Pulmonary infiltrates   Acute urinary retention   Delirium   Hematuria   Discharge Condition: Poor  Diet recommendation: Comfort feeds  Filed Weights   03/03/21 0451 03/04/21 0451 03/05/21 0400  Weight: 66.9 kg 64.6 kg 65.8 kg    History of present illness:  88/M with history of dementia, chronic systolic CHF, chronic A-fib on apixaban was brought to the ED by his wife due to worsening dyspnea and edema X 7 days, in the ED creatinine was 1.2, BNP> 4500, chest x-ray noted bilateral pleural effusion right> left,?  Loculation and upper and lower lobe interstitial infiltrates. -Hospitalization complicated by agitation, delirium requiring Haldol and Ativan  Hospital Course:   Acute on chronic combined systolic and diastolic CHF ([place])- (present on admission) -Echocardiogram with LV EF 35 to 40% with global hypokinesis, positive LVH, moderate reduction in RV systolic function, RSVP 54, severe tricuspid regurgitation, aortic valve with moderate to severe regurgitation -Diuresed with IV Lasix on admission, unfortunately creatinine trended up and sodium went to 151 -Sodium, kidney function started stabilizing and oral diuretics were  resumed -Overall prognosis was felt to be poor in the setting of dementia, CHF, cor pulmonale, valvular heart disease, now worsening aspiration pneumonia -Palliative care consulted for goals of care, discussed poor prognosis and ongoing failure to thrive with advanced age, progressive dementia and multiple medical problems, original plan was for home with hospice services, now with worsening clinical status and ongoing decline he will discharge to residential hospice   Aspiration pneumonia ([place]) -Suspected, pulmonary infiltrates noted on imaging  -Diuretics as above, treated with IV Unasyn X 4 days -SLP evaluation completed -Overnight 2/18 aspirated again, developed worsening pneumonia, hypoxia, respiratory distress and encephalopathy -Now comfort care, plan for residential hospice   Acute kidney injury superimposed on chronic kidney disease ([place])- (present on admission) CKD stage 3a/ hypernatremia -Now comfort care   Permanent atrial fibrillation ([place])- (present on admission)   Dementia with behavioral disturbance- (present on admission) -With progressive dementia, and agitation   Hematuria- (present on admission)   Acute urinary retention- (present on admission) - Started on Flomax, unfortunately had to have urinary catheter placed  for recurrent retention   Hyperlipidemia- (present on admission)       Consultations: Palliative medicine  Discharge Exam: Vitals:   03/06/21 1219 03/06/21 1222  BP: (!) 157/117 (!) 173/73  Pulse: 96 85  Resp: 16 20  Temp: 99.1 F (37.3 C)   SpO2: 100% 96%    General: Obtunded, poorly responsive Cardiovascular: S1-S2, tachycardic Respiratory: Scattered rhonchi  Discharge Instructions    Allergies as of 03/06/2021   No Known Allergies      Medication List     STOP taking these medications    apixaban 5 MG Tabs tablet Commonly known as: Eliquis   atorvastatin 40 MG tablet Commonly known as: LIPITOR   brimonidine 0.15 %  ophthalmic solution Commonly known as: ALPHAGAN   carvedilol 3.125 MG tablet Commonly known as: COREG   donepezil 5 MG tablet Commonly known as: ARICEPT   dorzolamide-timolol 22.3-6.8 MG/ML ophthalmic solution Commonly known as: COSOPT   furosemide 40 MG tablet Commonly known as: LASIX   memantine 10 MG tablet Commonly known as: NAMENDA   multivitamin with minerals Tabs tablet   PRESCRIPTION MEDICATION   sacubitril-valsartan 49-51 MG Commonly known as: ENTRESTO   tamsulosin 0.4 MG Caps capsule Commonly known as: FLOMAX       TAKE these medications    LORazepam 1 MG tablet Commonly known as: ATIVAN Place 1 tablet (1 mg total) under the tongue every 4 (four) hours as needed for anxiety.   morphine 20 MG/ML concentrated solution Commonly known as: ROXANOL Take 0.25 mLs (5 mg total) by mouth every 4 (four) hours as needed for severe pain or shortness of breath.       No Known Allergies  Follow-up Information     [facility] Follow up.   Specialty: Hospice and Palliative Medicine Why: home hospice Contact information: [address] [place] [address] [phone]                 The results of significant diagnostics from this hospitalization (including imaging, microbiology, ancillary and laboratory) are listed below for reference.    Significant Diagnostic Studies: DG Chest 2 View  Result Date: 02/27/2021 CLINICAL DATA:  Dyspnea EXAM: CHEST - 2 VIEW COMPARISON:  August 18, 2018 FINDINGS: Enlarged cardiac silhouette with central vascular prominence. Aortic atherosclerosis. Left greater than right basilar predominant interstitial and airspace. Small right and tiny left pleural effusions. No acute osseous abnormality. IMPRESSION: Findings most consistent with congestive heart failure with pulmonary edema and small right-greater-than-left pleural effusions. Superimposed infection not excluded. Electronically Signed   By: Maudry Mayhew M.D.   On: 02/27/2021 14:56   DG CHEST PORT 1 VIEW  Result Date: 03/04/2021 CLINICAL DATA:  Respiratory distress. EXAM: PORTABLE CHEST 1 VIEW COMPARISON:  March 01, 2021 FINDINGS: Marked severity airspace disease is seen within the suprahilar region of the left lung. This is increased in severity when compared to the prior study. Moderate severity areas of bibasilar atelectasis and/or infiltrate are also noted, left greater than right. There are small to moderate size bilateral pleural effusions, left greater than right. No pneumothorax is identified. The cardiac silhouette is moderately enlarged. The visualized skeletal structures are unremarkable. IMPRESSION: 1. Marked severity left upper lobe airspace disease, increased in severity when compared to the prior study. 2. Moderate severity bibasilar atelectasis and/or infiltrate, left greater than right. 3. Small to moderate size bilateral pleural effusions, left greater than right. Electronically Signed   By: Aram Candela M.D.   On: 03/04/2021 20:54   DG CHEST PORT 1 VIEW  Result Date: 03/01/2021 CLINICAL DATA:  Dyspnea EXAM: PORTABLE CHEST 1 VIEW COMPARISON:  02/27/2021 FINDINGS: Rotated AP portable examination. No significant change in layering bilateral pleural effusions, dense bilateral heterogeneous and interstitial airspace opacity, and gross cardiomegaly. IMPRESSION: No significant change in layering bilateral pleural effusions, dense bilateral heterogeneous and interstitial airspace opacity, and gross cardiomegaly. Findings are consistent with edema, infection, and/or ARDS. Electronically Signed   By: Jearld Lesch M.D.   On: 03/01/2021 08:26   ECHOCARDIOGRAM COMPLETE  Result Date: 02/28/2021    ECHOCARDIOGRAM REPORT   Patient Name:   Tyler Carr Date of Exam: 02/28/2021 Medical Rec #:  [id number]     Height:  67.0 in Accession #:    [id number]    Weight:       146.0 lb Date of Birth:  1930/05/11     BSA:          1.769 m  Patient Age:    52 years      BP:           162/98 mmHg Patient Gender: M             HR:           98 bpm. Exam Location:  Inpatient Procedure: 2D Echo, Cardiac Doppler and Color Doppler Indications:    I50.21 CHF  History:        Patient has prior history of Echocardiogram examinations, most                 recent 01/31/2018. CHF; Risk Factors:Hypertension and                 Dyslipidemia.  Sonographer:    Beryle Beams Referring Phys: [id number] [place] T TU IMPRESSIONS  1. Left ventricular ejection fraction, by estimation, is 35 to 40%. The left ventricle has moderately decreased function. The left ventricle demonstrates global hypokinesis. There is moderate concentric left ventricular hypertrophy. Left ventricular diastolic function could not be evaluated.  2. Right ventricular systolic function is moderately reduced. The right ventricular size is moderately enlarged. There is moderately elevated pulmonary artery systolic pressure. The estimated right ventricular systolic pressure is 99991111 mmHg.  3. Left atrial size was severely dilated.  4. Right atrial size was severely dilated.  5. A small pericardial effusion is present. The pericardial effusion is circumferential. There is no evidence of cardiac tamponade.  6. The mitral valve is grossly normal. Mild to moderate mitral valve regurgitation. No evidence of mitral stenosis.  7. Tricuspid valve regurgitation is severe.  8. The aortic valve is tricuspid. Aortic valve regurgitation is moderate to severe. No aortic stenosis is present.  9. Aortic dilatation noted. There is mild dilatation of the aortic root, measuring 41 mm. There is mild dilatation of the ascending aorta, measuring 42 mm. 10. The inferior vena cava is dilated in size with <50% respiratory variability, suggesting right atrial pressure of 15 mmHg. Comparison(s): Changes from prior study are noted. EF unchanged ~35%. Moderately reduced RV function. RVSP ~54 mmHG. Moderate to severe AI now present. BP  during study 162/98. FINDINGS  Left Ventricle: Left ventricular ejection fraction, by estimation, is 35 to 40%. The left ventricle has moderately decreased function. The left ventricle demonstrates global hypokinesis. The left ventricular internal cavity size was normal in size. There is moderate concentric left ventricular hypertrophy. Left ventricular diastolic function could not be evaluated due to atrial fibrillation. Left ventricular diastolic function could not be evaluated. The E/e' is 27.8. Right Ventricle: The right ventricular size is moderately enlarged. No increase in right ventricular wall thickness. Right ventricular systolic function is moderately reduced. There is moderately elevated pulmonary artery systolic pressure. The tricuspid  regurgitant velocity is 3.13 m/s, and with an assumed right atrial pressure of 15 mmHg, the estimated right ventricular systolic pressure is 99991111 mmHg. Left Atrium: Left atrial size was severely dilated. Right Atrium: Right atrial size was severely dilated. Pericardium: A small pericardial effusion is present. The pericardial effusion is circumferential. There is no evidence of cardiac tamponade. Mitral Valve: The mitral valve is grossly normal. Mild to moderate mitral valve regurgitation. No evidence of mitral valve stenosis. Tricuspid Valve: The tricuspid valve is grossly  normal. Tricuspid valve regurgitation is severe. No evidence of tricuspid stenosis. Aortic Valve: The aortic valve is tricuspid. Aortic valve regurgitation is moderate to severe. Aortic regurgitation PHT measures 643 msec. No aortic stenosis is present. Aortic valve mean gradient measures 4.0 mmHg. Aortic valve peak gradient measures 5.5 mmHg. Aortic valve area, by VTI measures 1.38 cm. Pulmonic Valve: The pulmonic valve was grossly normal. Pulmonic valve regurgitation is trivial. No evidence of pulmonic stenosis. Aorta: Aortic dilatation noted. There is mild dilatation of the aortic root, measuring  41 mm. There is mild dilatation of the ascending aorta, measuring 42 mm. Venous: The inferior vena cava is dilated in size with less than 50% respiratory variability, suggesting right atrial pressure of 15 mmHg. IAS/Shunts: The atrial septum is grossly normal. Additional Comments: There is a small pleural effusion in the left lateral region.  LEFT VENTRICLE PLAX 2D LVIDd:         5.10 cm      Diastology LVIDs:         3.90 cm      LV e' medial:    4.57 cm/s LV PW:         1.50 cm      LV E/e' medial:  27.8 LV IVS:        1.30 cm      LV e' lateral:   9.68 cm/s LVOT diam:     2.00 cm      LV E/e' lateral: 13.1 LV SV:         28 LV SV Index:   16 LVOT Area:     3.14 cm  LV Volumes (MOD) LV vol d, MOD A2C: 104.0 ml LV vol d, MOD A4C: 114.0 ml LV vol s, MOD A2C: 68.6 ml LV vol s, MOD A4C: 58.8 ml LV SV MOD A2C:     35.4 ml LV SV MOD A4C:     114.0 ml LV SV MOD BP:      45.0 ml RIGHT VENTRICLE             IVC RV S prime:     13.90 cm/s  IVC diam: 2.50 cm RVOT diam:      2.80 cm TAPSE (M-mode): 1.3 cm LEFT ATRIUM              Index        RIGHT ATRIUM           Index LA diam:        5.80 cm  3.28 cm/m   RA Area:     31.70 cm LA Vol (A2C):   158.0 ml 89.31 ml/m  RA Volume:   110.00 ml 62.18 ml/m LA Vol (A4C):   114.0 ml 64.44 ml/m LA Biplane Vol: 142.0 ml 80.27 ml/m  AORTIC VALVE                    PULMONIC VALVE AV Area (Vmax):    1.54 cm     PV Vmax:       0.50 m/s AV Area (Vmean):   1.23 cm     PV Vmean:      26.400 cm/s AV Area (VTI):     1.38 cm     PV VTI:        0.061 m AV Vmax:           117.00 cm/s  PV Peak grad:  1.0 mmHg AV Vmean:          92.400  cm/s  PV Mean grad:  0.0 mmHg AV VTI:            0.201 m AV Peak Grad:      5.5 mmHg AV Mean Grad:      4.0 mmHg LVOT Vmax:         57.20 cm/s LVOT Vmean:        36.200 cm/s LVOT VTI:          0.088 m LVOT/AV VTI ratio: 0.44 AI PHT:            643 msec  AORTA Ao Root diam: 4.10 cm Ao Asc diam:  4.20 cm MITRAL VALVE                TRICUSPID VALVE MV Area (PHT):  3.99 cm     TV Peak grad:   58.1 mmHg MV Decel Time: 190 msec     TV Mean grad:   36.0 mmHg MV E velocity: 127.00 cm/s  TV Vmax:        3.81 m/s MV A velocity: 99.20 cm/s   TV Vmean:       282.0 cm/s MV E/A ratio:  1.28         TV VTI:         1.15 msec                             TR Peak grad:   39.2 mmHg                             TR Vmax:        313.00 cm/s                              SHUNTS                             Systemic VTI:  0.09 m                             Systemic Diam: 2.00 cm                             Pulmonic Diam: 2.80 cm Lennie Odor MD Electronically signed by Lennie Odor MD Signature Date/Time: 02/28/2021/9:30:52 AM    Final     Microbiology: Recent Results (from the past 240 hour(s))  Resp Panel by RT-PCR (Flu A&B, Covid) Nasopharyngeal Swab     Status: None   Collection Time: 02/28/21  1:14 PM   Specimen: Nasopharyngeal Swab; Nasopharyngeal(NP) swabs in vial transport medium  Result Value Ref Range Status   SARS Coronavirus 2 by RT PCR NEGATIVE NEGATIVE Final    Comment: (NOTE) SARS-CoV-2 target nucleic acids are NOT DETECTED.  The SARS-CoV-2 RNA is generally detectable in upper respiratory specimens during the acute phase of infection. The lowest concentration of SARS-CoV-2 viral copies this assay can detect is 138 copies/mL. A negative result does not preclude SARS-Cov-2 infection and should not be used as the sole basis for treatment or other patient management decisions. A negative result may occur with  improper specimen collection/handling, submission of specimen other than nasopharyngeal swab, presence of viral mutation(s) within the areas targeted by this assay, and  inadequate number of viral copies(<138 copies/mL). A negative result must be combined with clinical observations, patient history, and epidemiological information. The expected result is Negative.  Fact Sheet for Patients:  EntrepreneurPulse.com.au  Fact Sheet for  Healthcare Providers:  IncredibleEmployment.be  This test is no t yet approved or cleared by the [place] FDA and  has been authorized for detection and/or diagnosis of SARS-CoV-2 by FDA under an Emergency Use Authorization (EUA). This EUA will remain  in effect (meaning this test can be used) for the duration of the COVID-19 declaration under Section 564(b)(1) of the Act, 21 U.S.C.section 360bbb-3(b)(1), unless the authorization is terminated  or revoked sooner.       Influenza A by PCR NEGATIVE NEGATIVE Final   Influenza B by PCR NEGATIVE NEGATIVE Final    Comment: (NOTE) The Xpert Xpress SARS-CoV-2/FLU/RSV plus assay is intended as an aid in the diagnosis of influenza from Nasopharyngeal swab specimens and should not be used as a sole basis for treatment. Nasal washings and aspirates are unacceptable for Xpert Xpress SARS-CoV-2/FLU/RSV testing.  Fact Sheet for Patients: EntrepreneurPulse.com.au  Fact Sheet for Healthcare Providers: IncredibleEmployment.be  This test is not yet approved or cleared by the [place] FDA and has been authorized for detection and/or diagnosis of SARS-CoV-2 by FDA under an Emergency Use Authorization (EUA). This EUA will remain in effect (meaning this test can be used) for the duration of the COVID-19 declaration under Section 564(b)(1) of the Act, 21 U.S.C. section 360bbb-3(b)(1), unless the authorization is terminated or revoked.  Performed at [facility], [place] [address], [place], [place] [address]      Labs: Basic Metabolic Panel: Recent Labs  Lab 03/01/21 [phone] 03/02/21 0506 03/03/21 0420 03/04/21 0350 03/05/21 0500  NA 151* 150* 149* 148* 150*  K 4.2 3.3* 3.5 3.4* 4.4  CL 114* 112* 110 111 111  CO2 22 23 28 26 24   GLUCOSE 108* 111* 123* 107* 118*  BUN 46* 55* 53* 44* 47*  CREATININE 1.66* 1.69* 1.56* 1.31* 1.44*  CALCIUM 8.8* 9.0 9.0 8.7* 9.0  MG  --  2.5*   --   --   --    Liver Function Tests: No results for input(s): AST, ALT, ALKPHOS, BILITOT, PROT, ALBUMIN in the last 168 hours. No results for input(s): LIPASE, AMYLASE in the last 168 hours. No results for input(s): AMMONIA in the last 168 hours. CBC: Recent Labs  Lab 03/01/21 0437 03/02/21 0506 03/03/21 0420 03/04/21 0350 03/04/21 2135  WBC 9.7 10.8* 11.5* 10.2 14.8*  HGB 12.7* 14.8 15.1 14.4 16.0  HCT 38.5* 43.4 45.5 42.6 48.5  MCV 97.2 95.2 96.4 95.5 98.2  PLT 179 227 249 224 246   Cardiac Enzymes: No results for input(s): CKTOTAL, CKMB, CKMBINDEX, TROPONINI in the last 168 hours. BNP: BNP (last 3 results) Recent Labs    02/27/21 1425 03/04/21 2123  BNP >4,500.0* >4,500.0*    ProBNP (last 3 results) No results for input(s): PROBNP in the last 8760 hours.  CBG: No results for input(s): GLUCAP in the last 168 hours.     Signed:  Domenic Polite MD.  [facility] 03/06/2021, 2:30 PM

## 2021-03-06 NOTE — Progress Notes (Addendum)
Manufacturing engineer [facility]) Hospital Liaison Note  Bed offered and accepted for transfer to [facility] today. Unit RN please call report to [phone] prior to the patient leaving the unit. Please send signed DNR and paperwork with patient.   Please do not hesitate to call with questions. Thank you  Roselee Nova, [place] Hospital Liaison [phone]

## 2021-03-06 NOTE — TOC Transition Note (Signed)
Transition of Care [facility]) - CM/SW Discharge Note   Patient Details  Name: Tyler Carr MRN: [id number] Date of Birth: 18-Jul-1930  Transition of Care [facility]) CM/SW Contact:  Lynett Grimes Phone Number: 03/06/2021, 3:49 PM   Clinical Narrative:    Patient will DC to: [facility] Place Anticipated DC date: 03/06/2021 Family notified: Pt Spouse Transport by: Sharin Mons   Per MD patient ready for DC to . RN to call report prior to discharge [phone] ). RN, patient, patient's family, and facility notified of DC. Discharge Summary and FL2 sent to facility. DC packet on chart. Ambulance transport requested for patient.   CSW will sign off for now as social work intervention is no longer needed. Please consult [place] again if new needs arise.           Patient Goals and CMS Choice        Discharge Placement                       Discharge Plan and Services                                     Social Determinants of Health (SDOH) Interventions     Readmission Risk Interventions No flowsheet data found.

## 2021-03-06 NOTE — Progress Notes (Signed)
Report given to [facility]. All questions were answered. Per palliative NP okay to keep foley and IV access in.

## 2021-03-06 NOTE — Progress Notes (Signed)
Daily Progress Note   Patient Name: Tyler Carr       Date: 03/06/2021 DOB: 1930-10-27  Age: 88 y.o. MRN#: [id number] Attending Physician: Domenic Polite, MD Primary Care Physician: [facility] Admit Date: 02/27/2021  Reason for Consultation/Follow-up: Establishing goals of care  Patient Profile/HPI:  88 y.o. male  with past medical history of alzheimer's dementia, CKD, a fib admitted on 02/27/2021 with worsening shortness of breath and edema. Workup revealed bilateral pleural effusions, acute on chronic CHF, aspiration pneumonia. He has been treated with antibiotics and aggressive diuresis. Cr is trending up. BNP >4500.  Admission has been complicated with some delirium. Palliative medicine consulted for [place].    Subjective: Chart reviewed including progress notes, labs and imaging.  No prn medications needed.  Spouse at bedside.  He is comfortable with current interventions.  Awaiting inpatient hospice bed.   Review of Systems  Unable to perform ROS: Dementia    Physical Exam Vitals and nursing note reviewed.  Constitutional:      Comments: frail  Pulmonary:     Effort: Pulmonary effort is normal.  Neurological:     Mental Status: He is disoriented.            Vital Signs: BP (!) 173/73    Pulse 85    Temp 99.1 F (37.3 C)    Resp 20    Ht 5\' 10"  (1.778 m)    Wt 65.8 kg    SpO2 96%    BMI 20.81 kg/m  SpO2: SpO2: 96 % O2 Device: O2 Device: Nasal Cannula O2 Flow Rate: O2 Flow Rate (L/min): 4 L/min  Intake/output summary:  Intake/Output Summary (Last 24 hours) at 03/06/2021 1306 Last data filed at 03/06/2021 1221 Gross per 24 hour  Intake --  Output 475 ml  Net -475 ml    LBM: Last BM Date : 02/27/21 Baseline Weight: Weight: 66.6 kg Most recent weight:  Weight: 65.8 kg       Palliative Assessment/Data: PPS: 20%      Patient Active Problem List   Diagnosis Date Noted   Hematuria 03/04/2021   Aspiration pneumonia ([place]) 03/02/2021   Acute urinary retention 03/02/2021   Delirium 03/02/2021   Dementia with behavioral disturbance 02/28/2021   Permanent atrial fibrillation ([place]) 02/28/2021   Acute kidney injury superimposed on chronic kidney  disease ([place]) 02/28/2021   Pulmonary infiltrates 02/28/2021   Acute CHF (congestive heart failure) ([place]) 02/27/2021   Acute on chronic combined systolic and diastolic CHF (congestive heart failure) ([place])    AKI (acute kidney injury) ([place])    Vascular dementia without behavioral disturbance ([place])    Cardiac arrest ([place]) 05/11/2018   Drug overdose, accidental or unintentional, initial encounter 05/09/2018   Hypotension 05/09/2018   Accelerated hypertension    Symptomatic bradycardia 01/30/2018   CHF (congestive heart failure) (HCC)    Hypertension    Hyperlipidemia    Atrial fibrillation with slow ventricular response [facility])     Palliative Care Assessment & Plan    Assessment/Recommendations/Plan  Aspiration pneumonia in the setting of advancing dementia- not eating or drinking now- plan is for comfort measures only and supporting through EOL with dignity Awaiting bed availability Continue symptom management as ordered:  Morphine 1 mg q4hrs IV scheduled Morphine 2mg  q15 min as needed for SOB, dyspnea, RR>26 Lorazepam 0.5mg  IV q4hr as needed for anxiety Haldol 0.5mg  IV q4 hours as needed for agitation or terminal delerium Robinul .2mg  IV q2hr prn for secretions D/C all other meds, tests, interventions that do not provide comfort     Code Status: DNR  Prognosis:  < 2 weeks  Discharge Planning: Hospice facility  Care plan was discussed with patient's spouse.   Thank you for allowing the Palliative Medicine Team to assist in the care of this patient.  Mariana Kaufman, AGNP-C Palliative Medicine   Please contact Palliative Medicine Team phone at [phone] for questions and concerns.

## 2021-03-08 ENCOUNTER — Encounter: Payer: Self-pay | Admitting: Cardiology

## 2021-03-15 DEATH — deceased

## 2021-03-22 ENCOUNTER — Ambulatory Visit: Payer: Medicare PPO | Admitting: Physician Assistant

## 2021-04-16 NOTE — Progress Notes (Deleted)
?Cardiology Office Note:   ? ?Date:  04/16/2021  ? ?ID:  Tyler Carr, DOB Jan 12, 1931, MRN [id number] ? ?PCP:  Clinic, Tyler Carr ?  ?[facility] Providers ?Cardiologist:  Tyler Furbish, MD { ?Click to update primary MD,subspecialty MD or APP then REFRESH:1}   ? ?Referring MD: Clinic, Tyler Carr  ? ?Chief Complaint: *** ? ?History of Present Illness:   ? ?Tyler Carr is a 88 y.o. male with a hx of accelerated hypertension, chronic combined CHF, prior PEA cardiac arrest 2/2 tachy-brady syndrome, permanent atrial fibrillation on chronic anticoagulation, dilated aortic root, PVD, left renal artery stent 2020, dementia, CKD ? ?Cardiac arrest 05/09/18 following accidental overdose on multiple medications, involved 12 minutes, 3 rounds of epinephrine with ROSC. He did not wish to pursue pacemaker implant. Echocardiogram 05/11/2018 showed EF 30%, was previously 50% (01/2018) at the [place]. Concern for ischemic cardiomyopathy, plan to treat medically due to co-morbidities. ? ?He was last seen in our office by Dr. Marlou Carr on 12/07/19 at which time no changes to treatment plan were made and 2-month follow-up was recommended.  ? ?Admission 2/13-2/20/23 presenting with exertional dyspnea and swelling in his legs.  His wife reported worsening dyspnea over the past week with significant shortness of breath just walking to the bathroom and progression of leg edema.  EKG revealed atrial fibrillation at 66 bpm, possible anterior infarct age undetermined, no significant changes since previous tracing.  BNP greater than 4500, CXR noted bilateral pleural effusions right greater than left.  Echo revealed LVEF 35 to 40%, global hypokinesis, LVH, moderate reduction in RV systolic function, RSVP 54, severe tricuspid regurg, aortic valve with moderate to severe regurgitation.  He had elevated sodium level and creatinine on IV diuresis which improved with adjustment in dosing, aspiration pneumonia with worsening hypoxia. Palliative care  consult in the hospital with goals of care defined. ? ?Today, he is here  ? ?Past Medical History:  ?Diagnosis Date  ? CHF (congestive heart failure) ([place])   ? Dementia ([place])   ? Hyperlipidemia   ? Hypertension   ? ? ?Past Surgical History:  ?Procedure Laterality Date  ? ABDOMINAL AORTOGRAM N/A 02/04/2018  ? Procedure: ABDOMINAL AORTOGRAM;  Surgeon: Tyler Mitchell, MD;  Location: [facility];  Service: Cardiovascular;  Laterality: N/A;  ? HERNIA REPAIR    ? PERIPHERAL VASCULAR INTERVENTION Left 02/04/2018  ? Procedure: PERIPHERAL VASCULAR INTERVENTION;  Surgeon: Tyler Mitchell, MD;  Location: [facility];  Service: Cardiovascular;  Laterality: Left;  Renal artery  ? ? ?Current Medications: ?No outpatient medications have been marked as taking for the 04/18/21 encounter (Appointment) with Tyler Carr, Tyler Schwab, NP.  ?  ? ?Allergies:   Patient has no known allergies.  ? ?Social History  ? ?Socioeconomic History  ? Marital status: Married  ?  Spouse name: Not on file  ? Number of children: Not on file  ? Years of education: Not on file  ? Highest education level: Not on file  ?Occupational History  ? Not on file  ?Tobacco Use  ? Smoking status: Never  ? Smokeless tobacco: Never  ?Vaping Use  ? Vaping Use: Never used  ?Substance and Sexual Activity  ? Alcohol use: Not Currently  ? Drug use: Not Currently  ? Sexual activity: Not Currently  ?Other Topics Concern  ? Not on file  ?Social History Narrative  ? Not on file  ? ?Social Determinants of Health  ? ?Financial Resource Strain: Not on file  ?Food Insecurity: Not  on file  ?Transportation Needs: Not on file  ?Physical Activity: Not on file  ?Stress: Not on file  ?Social Connections: Not on file  ?  ? ?Family History: ?The patient's ***family history includes Stroke in his mother. ? ?ROS:   ?Please see the history of present illness.    ?*** All other systems reviewed and are negative. ? ?Labs/Other Studies Reviewed:   ? ?The following studies were reviewed  today: ? ?Echo 02/28/21 ? ? ?Left Ventricle: Left ventricular ejection fraction, by estimation, is 35  ?to 40%. The left ventricle has moderately decreased function. The left  ?ventricle demonstrates global hypokinesis. The left ventricular internal  ?cavity size was normal in size.  ?There is moderate concentric left ventricular hypertrophy. Left  ?ventricular diastolic function could not be evaluated due to atrial  ?fibrillation. Left ventricular diastolic function could not be evaluated.  ?The E/e' is 27.8.  ? ?Right Ventricle: The right ventricular size is moderately enlarged. No  ?increase in right ventricular wall thickness. Right ventricular systolic  ?function is moderately reduced. There is moderately elevated pulmonary  ?artery systolic pressure. The tricuspid  ? regurgitant velocity is 3.13 m/s, and with an assumed right atrial  ?pressure of 15 mmHg, the estimated right ventricular systolic pressure is  ?99991111 mmHg.  ? ?Left Atrium: Left atrial size was severely dilated.  ? ?Right Atrium: Right atrial size was severely dilated.  ? ?Pericardium: A small pericardial effusion is present. The pericardial  ?effusion is circumferential. There is no evidence of cardiac tamponade.  ? ?Mitral Valve: The mitral valve is grossly normal. Mild to moderate mitral  ?valve regurgitation. No evidence of mitral valve stenosis.  ? ?Tricuspid Valve: The tricuspid valve is grossly normal. Tricuspid valve  ?regurgitation is severe. No evidence of tricuspid stenosis.  ? ?Aortic Valve: The aortic valve is tricuspid. Aortic valve regurgitation is  ?moderate to severe. Aortic regurgitation PHT measures 643 msec. No aortic  ?stenosis is present. Aortic valve mean gradient measures 4.0 mmHg. Aortic  ?valve peak gradient measures  ?5.5 mmHg. Aortic valve area, by VTI measures 1.38 cm?.  ? ?Pulmonic Valve: The pulmonic valve was grossly normal. Pulmonic valve  ?regurgitation is trivial. No evidence of pulmonic stenosis.  ? ?Aorta:  Aortic dilatation noted. There is mild dilatation of the aortic  ?root, measuring 41 mm. There is mild dilatation of the ascending aorta,  ?measuring 42 mm.  ? ?Venous: The inferior vena cava is dilated in size with less than 50%  ?respiratory variability, suggesting right atrial pressure of 15 mmHg.  ? ?IAS/Shunts: The atrial septum is grossly normal.  ? ?Recent Labs: ?03/02/2021: Magnesium 2.5 ?03/04/2021: B Natriuretic Peptide >4,500.0; Hemoglobin 16.0; Platelets 246 ?03/05/2021: BUN 47; Creatinine, Ser 1.44; Potassium 4.4; Sodium 150  ?Recent Lipid Panel ?   ?Component Value Date/Time  ? CHOL 102 03/19/2019 1032  ? TRIG 55 03/19/2019 1032  ? HDL 39 (L) 03/19/2019 1032  ? CHOLHDL 2.6 03/19/2019 1032  ? [place] 50 03/19/2019 1032  ? ? ? ?Risk Assessment/Calculations:   ?{Does this patient have ATRIAL FIBRILLATION?:[id number]} ? ?    ? ?Physical Exam:   ? ?VS:  There were no vitals taken for this visit.   ? ?Wt Readings from Last 3 Encounters:  ?03/05/21 145 lb 1 oz (65.8 kg)  ?12/07/19 146 lb (66.2 kg)  ?10/05/19 143 lb (64.9 kg)  ?  ? ?GEN: *** Well nourished, well developed in no acute distress ?HEENT: Normal ?NECK: No JVD; No carotid bruits ?CARDIAC: ***RRR,  no murmurs, rubs, gallops ?RESPIRATORY:  Clear to auscultation without rales, wheezing or rhonchi  ?ABDOMEN: Soft, non-tender, non-distended ?MUSCULOSKELETAL:  No edema; No deformity. *** pedal pulses, ***bilaterally ?SKIN: Warm and dry ?NEUROLOGIC:  Alert and oriented x 3 ?PSYCHIATRIC:  Normal affect  ? ?EKG:  EKG is *** ordered today.  The ekg ordered today demonstrates *** ? ?Diagnoses:   ? ?No diagnosis found. ?Assessment and Plan:   ? ? ?*** ? ?   ? ?   ? ?{Are you ordering a CV Procedure (e.g. stress test, cath, DCCV, TEE, etc)?   Press F2        :[id number]  ? ? ?Medication Adjustments/Labs and Tests Ordered: ?Current medicines are reviewed at length with the patient today.  Concerns regarding medicines are outlined above.  ?No orders of the defined  types were placed in this encounter. ? ?No orders of the defined types were placed in this encounter. ? ? ?There are no Patient Instructions on file for this visit.  ? ?Signed, ?Emmaline Life, NP  ?04/17/18

## 2021-04-18 ENCOUNTER — Ambulatory Visit: Payer: Self-pay | Admitting: Nurse Practitioner
